# Patient Record
Sex: Female | Born: 1980 | Race: White | Hispanic: No | Marital: Single | Smoking: Current every day smoker
Health system: Southern US, Community
[De-identification: ages and names within clinical notes are randomized; demographics above are authoritative.]

## PROBLEM LIST (undated history)

## (undated) ENCOUNTER — Emergency Department: Discharge status: Absent without leave | Payer: Medicaid Other

## (undated) ENCOUNTER — Inpatient Hospital Stay (HOSPITAL_COMMUNITY): Payer: Self-pay

## (undated) DIAGNOSIS — B9689 Other specified bacterial agents as the cause of diseases classified elsewhere: Secondary | ICD-10-CM

## (undated) DIAGNOSIS — D649 Anemia, unspecified: Secondary | ICD-10-CM

## (undated) DIAGNOSIS — N760 Acute vaginitis: Secondary | ICD-10-CM

## (undated) DIAGNOSIS — F419 Anxiety disorder, unspecified: Secondary | ICD-10-CM

## (undated) DIAGNOSIS — F32A Depression, unspecified: Secondary | ICD-10-CM

## (undated) DIAGNOSIS — Z8742 Personal history of other diseases of the female genital tract: Secondary | ICD-10-CM

## (undated) DIAGNOSIS — F329 Major depressive disorder, single episode, unspecified: Secondary | ICD-10-CM

## (undated) DIAGNOSIS — R87629 Unspecified abnormal cytological findings in specimens from vagina: Secondary | ICD-10-CM

## (undated) HISTORY — DX: Major depressive disorder, single episode, unspecified: F32.9

## (undated) HISTORY — PX: COLPOSCOPY: SHX161

## (undated) HISTORY — DX: Personal history of other diseases of the female genital tract: Z87.42

## (undated) HISTORY — PX: INDUCED ABORTION: SHX677

## (undated) HISTORY — DX: Depression, unspecified: F32.A

## (undated) HISTORY — DX: Anemia, unspecified: D64.9

## (undated) HISTORY — PX: WISDOM TOOTH EXTRACTION: SHX21

## (undated) HISTORY — PX: TONSILLECTOMY: SUR1361

## (undated) HISTORY — DX: Anxiety disorder, unspecified: F41.9

---

## 2004-06-18 ENCOUNTER — Emergency Department: Payer: Self-pay | Admitting: Unknown Physician Specialty

## 2004-06-25 ENCOUNTER — Ambulatory Visit: Payer: Self-pay | Admitting: Obstetrics and Gynecology

## 2004-07-31 ENCOUNTER — Emergency Department: Payer: Self-pay | Admitting: Emergency Medicine

## 2004-11-12 ENCOUNTER — Ambulatory Visit (HOSPITAL_COMMUNITY): Admission: RE | Admit: 2004-11-12 | Discharge: 2004-11-12 | Payer: Self-pay | Admitting: Gynecology

## 2004-11-29 ENCOUNTER — Inpatient Hospital Stay (HOSPITAL_COMMUNITY): Admission: AD | Admit: 2004-11-29 | Discharge: 2004-11-29 | Payer: Self-pay | Admitting: Gynecology

## 2005-01-21 ENCOUNTER — Ambulatory Visit (HOSPITAL_COMMUNITY): Admission: RE | Admit: 2005-01-21 | Discharge: 2005-01-21 | Payer: Self-pay | Admitting: Gynecology

## 2005-03-05 ENCOUNTER — Inpatient Hospital Stay (HOSPITAL_COMMUNITY): Admission: AD | Admit: 2005-03-05 | Discharge: 2005-03-05 | Payer: Self-pay | Admitting: Gynecology

## 2005-03-11 ENCOUNTER — Emergency Department: Payer: Self-pay | Admitting: Emergency Medicine

## 2005-03-13 ENCOUNTER — Inpatient Hospital Stay (HOSPITAL_COMMUNITY): Admission: AD | Admit: 2005-03-13 | Discharge: 2005-03-15 | Payer: Self-pay | Admitting: Gynecology

## 2006-01-12 ENCOUNTER — Ambulatory Visit: Payer: Self-pay | Admitting: Obstetrics & Gynecology

## 2006-01-23 ENCOUNTER — Ambulatory Visit (HOSPITAL_COMMUNITY): Admission: RE | Admit: 2006-01-23 | Discharge: 2006-01-23 | Payer: Self-pay | Admitting: Gynecology

## 2006-02-05 ENCOUNTER — Ambulatory Visit: Payer: Self-pay | Admitting: Obstetrics & Gynecology

## 2006-02-05 ENCOUNTER — Ambulatory Visit: Payer: Self-pay | Admitting: Obstetrics and Gynecology

## 2006-02-05 ENCOUNTER — Encounter (INDEPENDENT_AMBULATORY_CARE_PROVIDER_SITE_OTHER): Payer: Self-pay | Admitting: *Deleted

## 2006-03-12 ENCOUNTER — Ambulatory Visit: Payer: Self-pay | Admitting: Obstetrics & Gynecology

## 2006-03-26 ENCOUNTER — Ambulatory Visit: Payer: Self-pay | Admitting: Obstetrics & Gynecology

## 2006-04-08 ENCOUNTER — Ambulatory Visit (HOSPITAL_COMMUNITY): Admission: RE | Admit: 2006-04-08 | Discharge: 2006-04-08 | Payer: Self-pay | Admitting: Gynecology

## 2006-04-09 ENCOUNTER — Ambulatory Visit: Payer: Self-pay | Admitting: Obstetrics & Gynecology

## 2006-05-06 ENCOUNTER — Ambulatory Visit: Payer: Self-pay | Admitting: Obstetrics & Gynecology

## 2006-05-27 ENCOUNTER — Ambulatory Visit: Payer: Self-pay | Admitting: Gynecology

## 2006-05-27 ENCOUNTER — Inpatient Hospital Stay (HOSPITAL_COMMUNITY): Admission: AD | Admit: 2006-05-27 | Discharge: 2006-05-27 | Payer: Self-pay | Admitting: Obstetrics & Gynecology

## 2006-06-03 ENCOUNTER — Ambulatory Visit: Payer: Self-pay | Admitting: Obstetrics & Gynecology

## 2006-06-23 ENCOUNTER — Ambulatory Visit: Payer: Self-pay | Admitting: Family Medicine

## 2006-07-08 ENCOUNTER — Ambulatory Visit: Payer: Self-pay | Admitting: Obstetrics & Gynecology

## 2006-07-13 ENCOUNTER — Ambulatory Visit (HOSPITAL_COMMUNITY): Admission: RE | Admit: 2006-07-13 | Discharge: 2006-07-13 | Payer: Self-pay | Admitting: Obstetrics & Gynecology

## 2006-07-21 ENCOUNTER — Ambulatory Visit: Payer: Self-pay | Admitting: Family Medicine

## 2006-08-05 ENCOUNTER — Ambulatory Visit: Payer: Self-pay | Admitting: Obstetrics & Gynecology

## 2006-08-12 ENCOUNTER — Ambulatory Visit: Payer: Self-pay | Admitting: Obstetrics & Gynecology

## 2006-08-12 ENCOUNTER — Ambulatory Visit: Payer: Self-pay | Admitting: Obstetrics and Gynecology

## 2006-08-12 ENCOUNTER — Encounter: Payer: Self-pay | Admitting: Obstetrics and Gynecology

## 2006-08-12 ENCOUNTER — Inpatient Hospital Stay (HOSPITAL_COMMUNITY): Admission: AD | Admit: 2006-08-12 | Discharge: 2006-08-15 | Payer: Self-pay | Admitting: Obstetrics and Gynecology

## 2006-09-23 ENCOUNTER — Ambulatory Visit: Payer: Self-pay | Admitting: Obstetrics & Gynecology

## 2006-09-23 ENCOUNTER — Encounter (INDEPENDENT_AMBULATORY_CARE_PROVIDER_SITE_OTHER): Payer: Self-pay | Admitting: Gynecology

## 2006-09-29 ENCOUNTER — Ambulatory Visit: Payer: Self-pay | Admitting: Family Medicine

## 2007-10-22 ENCOUNTER — Emergency Department: Payer: Self-pay | Admitting: Emergency Medicine

## 2008-09-21 ENCOUNTER — Emergency Department: Payer: Self-pay | Admitting: Emergency Medicine

## 2010-07-02 ENCOUNTER — Ambulatory Visit: Payer: Self-pay | Admitting: Obstetrics and Gynecology

## 2010-07-16 NOTE — Op Note (Signed)
[name], [name]          ACCOUNT NO.:  [id number]   MEDICAL RECORD NO.:  [id number]          PATIENT TYPE:  POB   LOCATION:  [facility]                          FACILITY:  [facility]   PHYSICIAN:  Phil D. Okey Dupre, M.D.     DATE OF BIRTH:  Aug 14, 1980   DATE OF PROCEDURE:  08/12/2006  DATE OF DISCHARGE:                               OPERATIVE REPORT   SURGEON:  Dr. Okey Dupre.   FIRST ASSISTANT:  Caren Griffins, certified midwife.   ANESTHESIA:  Spinal.   ESTIMATED BLOOD LOSS:  700 mL.   PROCEDURE:  Low transverse Cesarean section with small vertical  addition.   POSTOPERATIVE CONDITION:  Satisfactory.   The patient was a female weighing 2358 grams, i.e. 6 pounds 13 ounces,  Apgars 9 and 9, cord pH 7.34.   REASON FOR PROCEDURE:  The patient came in to the MAU in active labor  with no palpable presenting part in the pelvis. Ultrasound revealed a  transverse lie with the baby on its side and the head to the right.   The procedure went as follows:  Under satisfactory spinal anesthesia  with the patient in a dorsal supine position, the abdomen was prepped  and draped after a Foley catheter was placed in the urinary bladder, and  pelvic examination on the operating table confirmed the diagnosis of no  presenting part. The abdomen was then entered through a Pfannenstiel  transverse incision situated 3 cm above the symphysis pubis and running  for 18 cm length. The abdomen was entered by layers. On opening the  fascia, the rectus muscles were separated manually. The peritoneal  cavity was entered. The visceral peritoneum on the anterior surface of  the uterus was opened transversely just above the bladder and the  bladder flap formed and pushed down from the lower uterine segment which  was entered by sharp and blunt dissection. Because of the failure of the  lower uterine segment to be dilated, it was necessary to make a small  vertical additional of approximately 2 cm in the mid portion of  the  transverse uterine incision. The baby was then manipulated to bring the  head down into the operative field.  A vacuum assisted delivery was thus  easily accomplished. The cord was doubly clamped and divided. The baby's  mouth was suctioned of fluid, and the baby handed to the pediatrician.  Samples of blood were taken from the umbilical cord for analysis, and  the placenta was spontaneously removed, sent for pathological diagnosis.  The uterus explored and closed with a continuous running 0 Vicryl on an  atraumatic needle in a transverse method through the entire incision.  Once this was done, there was a small oozing in the center of the  incision which was controlled with a figure-of-eight of the same suture.  Area was once again observed for bleeding, and it was noted the pelvis  was irrigated with normal saline, and the fascia closed with a  continuous running 0 Vicryl on an atraumatic needle. Subcutaneous  bleeders were controlled  with hot cautery and skin edge approximation with skin staples. A dry  sterile dressing was applied. The patient was transferred to the  recovery room in satisfactory condition, having tolerated the procedure  well with a Foley catheter draining clear amber urine at the end of the  procedure.      Phil D. Okey Dupre, M.D.  Electronically Signed     PDR/MEDQ  D:  08/13/2006  T:  08/13/2006  Job:  [id number]

## 2010-07-16 NOTE — Discharge Summary (Signed)
Holly Gordon, Holly Gordon NO.:  [id number]   MEDICAL RECORD NO.:  [id number]          PATIENT TYPE:  INP   LOCATION:                                [facility]   PHYSICIAN:  Ginger Carne, MD  DATE OF BIRTH:  08-13-1980   DATE OF ADMISSION:  08/12/2006  DATE OF DISCHARGE:  08/15/2006                               DISCHARGE SUMMARY   DISCHARGE DIAGNOSES:  1. Delivery of a viable female infant via primary low transverse      cesarean section due to transverse lie and patient was in active      labor at the time of admission.  2. Postpartum fever.   DISCHARGE MEDICATIONS:  1. Motrin 600 mg one table q.6 hours p.r.n. pain.  2. Percocet 5/325 1-2 tablets q.4-6 hours p.r.n. pain.  3. Yasmin one tablet p.o. once daily.  4. Prenatal vitamin 1 tablet daily while breast feeding.   FOLLOW-UP INSTRUCTIONS:  The patient had her staples removed from her  incision prior to discharge.  She will followup at the [facility] in 6 weeks for her postpartum check.   PERTINENT LABS:  Blood type O positive with antibody negative. Rubella  immune.  Hepatitis B surface antigen is negative.  HIV is nonreactive.  Discharge hemoglobin is 12 on June 13.   HOSPITAL COURSE:  Holly Gordon is a 30 year old gravida 4, para 2, O, 2, 2,  who was admitted through the emergency room admissions in active labor  with cervical dilation to 5 cm.  Her baby was found to be in transverse  lie.  She was subsequently taken for primary low transverse cesarean  section due to this presentation.  Please see operative note for full  details of this procedure.  Holly Gordon had a postpartum fever which  spiked on June 12 at 3 p.m. to 100.7 degrees.  She then had two other  fevers throughout her course; her last fever was on June 13 at 7 a.m.  with a T-max to 99.6 degrees.  She was started on Ampicillin,  Gentamicin, and Clindamycin due to this postpartum fever.  She then  remained afebrile for a period  of 24 hours at which point her  antibiotics were stopped and she is now being discharged home.  She  reports that her pain is very well controlled with Motrin and Percocet.  She is bottle feeding and desires Yasmin contraception at the time of  discharge.  She was instructed to start her Yasmin oral contraceptive  pill 2 weeks after delivery of her baby due to an increased risk of  blood clots postpartum.  Otherwise Holly Gordon had a routine postpartum  course and is doing very well.    ______________________________  Sylvan Cheese, M.D.      Ginger Carne, MD  Electronically Signed   MJ/MEDQ  D:  08/15/2006  T:  08/15/2006  Job:  [id number]

## 2010-07-16 NOTE — Assessment & Plan Note (Signed)
NAME:  Holly Gordon, Holly Gordon NO.:  [id number]   MEDICAL RECORD NO.:  [id number]          PATIENT TYPE:  POB   LOCATION:  [facility]         FACILITY:  [facility]   PHYSICIAN:  Tinnie Gens, MD        DATE OF BIRTH:  Aug 16, 1980   DATE OF SERVICE:                                  CLINIC NOTE   CHIEF COMPLAINT:  Undesired fertility.   HISTORY OF PRESENT ILLNESS:  The patient is a 30 year old gravida 4,  para 2-0-2-2 who had a C-section for transverse lie on her last  delivery.  Appears to have had a vaginal delivery who desires for long  term sterilization.  She is here to have a postpartum check and desires  IUD insertion.   On exam, vital signs are in the chart.  She is well-developed, well-  nourished female in no acute distress.  ABDOMEN:  Soft, nontender, nondistended. She does have marked sunburn  across her shoulders.  GE:  Normal external female genitalia.  Vagina is pink and rugated.  Cervix is parous without lesions.   PROCEDURE:  The cervix is cleaned with Betadine x2.  Grasped the  anterior lip with a single tooth tenaculum and sounded to approximately  8 cm. IUD is inserted without difficulty.  Strings were trimmed to 1.5  cm.  The patient tolerated the procedure well.   IMPRESSION:  Undesired fertility. Status post IUD insertion.   PLAN:  Recheck strings in 2 weeks.           ______________________________  Tinnie Gens, MD     TP/MEDQ  D:  09/29/2006  T:  09/30/2006  Job:  [id number]

## 2010-07-19 NOTE — Op Note (Signed)
NAMEMARGEAUX, SWANTEK NO.:  [id number]   MEDICAL RECORD NO.:  [id number]          PATIENT TYPE:  INP   LOCATION:  9119                          [facility]ILITY[facility]   PHYSICIAN:  Charles A. Delcambre, MDDATE OF BIRTH:  03/18/1980   DATE OF PROCEDURE:  03/14/2005  DATE OF DISCHARGE:                                 OPERATIVE REPORT   DELIVERY NOTE:  This patient was admitted per Dr. Mia Creek and I took over  management, and she did go into labor with help of Pitocin augmentation at  low-dose protocol with cervix at 3 cm initially, becoming completely dilated  at 0216.  At approximately that  time she did develop some variables and was  checked and was noted to be complete.  She was somewhat difficult during  labor with cooperation per the nurse, and this is consistent with my history  and physical examination experience as well from 2115 on January 11.  When I  received a call to come for delivery, the nurse explained that the patient  was not cooperating for starting to push; however, when I arrived for  delivery shortly thereafter within 10 minutes, she was crowning and  delivered momentarily without difficulty.  She had spontaneous vaginal  delivery of a vigorous female, Apgars 8 and 9, intact perineum.  The placenta  was spontaneous, three-vessel and intact.  The patient cut the cord with my  assistance.  Cord arterial blood gas, cord could not be run, questionably  clotted per the lab.  Estimated blood loss was 300 mL or less.  Mother and  baby were recovering stably at this time.  The patient become complete at  0216 on January 12, delivered at 0258, and the placenta followed  spontaneously at 0314.      Charles A. Sydnee Cabal, MD  Electronically Signed     CAD/MEDQ  D:  03/14/2005  T:  03/14/2005  Job:  [id number]

## 2010-12-19 LAB — CBC
HCT: 33.7 — ABNORMAL LOW
HCT: 33.9 — ABNORMAL LOW
HCT: 35.1 — ABNORMAL LOW
HCT: 39.4
Hemoglobin: 11.3 — ABNORMAL LOW
Hemoglobin: 11.5 — ABNORMAL LOW
Hemoglobin: 12
Hemoglobin: 13.1
MCHC: 33.3
MCHC: 33.6
MCHC: 34.1
MCHC: 34.3
MCV: 92.1
MCV: 93
MCV: 93.2
MCV: 93.5
Platelets: 218
Platelets: 234
Platelets: 261
Platelets: 268
RBC: 3.6 — ABNORMAL LOW
RBC: 3.64 — ABNORMAL LOW
RBC: 3.81 — ABNORMAL LOW
RBC: 4.22
RDW: 12.8
RDW: 13
RDW: 13
RDW: 13.1
WBC: 18.4 — ABNORMAL HIGH
WBC: 21 — ABNORMAL HIGH
WBC: 21.3 — ABNORMAL HIGH
WBC: 26 — ABNORMAL HIGH

## 2010-12-19 LAB — URINALYSIS, ROUTINE W REFLEX MICROSCOPIC
Bilirubin Urine: NEGATIVE
Glucose, UA: NEGATIVE
Ketones, ur: NEGATIVE
Nitrite: NEGATIVE
Protein, ur: 100 — AB
Specific Gravity, Urine: <1.005 — ABNORMAL LOW
Urobilinogen, UA: 0.2
pH: 7

## 2010-12-19 LAB — URINE MICROSCOPIC-ADD ON

## 2010-12-19 LAB — CROSSMATCH
ABO/RH(D): O POS
Antibody Screen: NEGATIVE

## 2010-12-19 LAB — DIFFERENTIAL
Basophils Absolute: 0.1
Basophils Relative: 0
Eosinophils Absolute: 0.3
Eosinophils Relative: 1
Lymphocytes Relative: 13
Lymphs Abs: 2.7
Monocytes Absolute: 1.5 — ABNORMAL HIGH
Monocytes Relative: 7
Neutro Abs: 16.8 — ABNORMAL HIGH
Neutrophils Relative %: 79 — ABNORMAL HIGH

## 2010-12-19 LAB — RPR: RPR Ser Ql: NONREACTIVE

## 2010-12-19 LAB — ABO/RH: ABO/RH(D): O POS

## 2011-04-03 ENCOUNTER — Emergency Department: Payer: Self-pay | Admitting: *Deleted

## 2011-10-31 ENCOUNTER — Ambulatory Visit (INDEPENDENT_AMBULATORY_CARE_PROVIDER_SITE_OTHER): Payer: Self-pay | Admitting: Obstetrics & Gynecology

## 2011-10-31 ENCOUNTER — Encounter: Payer: Self-pay | Admitting: Obstetrics & Gynecology

## 2011-10-31 VITALS — BP 122/85 | HR 83 | Ht 63.0 in | Wt 164.0 lb

## 2011-10-31 DIAGNOSIS — Z716 Tobacco abuse counseling: Secondary | ICD-10-CM

## 2011-10-31 DIAGNOSIS — Z7189 Other specified counseling: Secondary | ICD-10-CM

## 2011-10-31 DIAGNOSIS — Z3009 Encounter for other general counseling and advice on contraception: Secondary | ICD-10-CM

## 2011-10-31 DIAGNOSIS — Z30432 Encounter for removal of intrauterine contraceptive device: Secondary | ICD-10-CM

## 2011-10-31 MED ORDER — NORETHIN-ETH ESTRAD-FE BIPHAS 1 MG-10 MCG / 10 MCG PO TABS: 1.0000 | ORAL_TABLET | Freq: Every day | ORAL | Status: DC

## 2011-10-31 NOTE — Progress Notes (Signed)
IUD Removal Note  Patient desires removal of Mirena IUD after it being in place for five years, wants OCPs for [facility].  Smokes 1.5 packs of cigarettes daily.  Has tolerated OCPs in the past, no personal history of thrombosis.   Patient was in the dorsal lithotomy position, normal external genitalia was noted. A speculum was placed in the patient's vagina, normal discharge was noted, no lesions. The multiparous cervix was visualized, no lesions, no abnormal discharge; and the cervix was swabbed with Betadine using scopettes. The strings of the IUD were grasped and pulled using ring forceps. The IUD was successfully removed in its entirety. Patient tolerated the procedure well.   The use of the oral contraceptive has been fully discussed with the patient. This includes the proper method to initiate (i.e. Sunday start after next normal menstrual onset) and continue the pills, the need for regular compliance to ensure adequate contraceptive effect, the physiology which make the pill effective, the instructions for what to do in event of a missed pill, and warnings about anticipated minor side effects such as breakthrough spotting, nausea, breast tenderness, weight changes, acne, headaches, etc. She has been told of the more serious potential side effects such as MI, stroke, and deep vein thrombosis, all of which she is at increased risk for secondary to smoking which is why the lowest estrogen pill is recommended. She has been asked to report any signs of such serious problems immediately. She should back up the pill with a condom during any cycle in which antibiotics are prescribed, and during the first cycle as well. The need for additional protection, such as a condom, to prevent exposure to sexually transmitted diseases has also been discussed- the patient has been clearly reminded that OCP's cannot protect her against diseases such as HIV and others. She understands and wishes to take the medication as  prescribed.   LoLoestrinFe, with 10 mcg of estrogen, two samples were given to the patient. She will return in 2 months for BP and OCP check. Patient will also apply for Mirena, she is also applying for [facility]. Once these are approved, she will return for annual exam, pap smear (last one was in 2008 and was LGSIL) and Mirena IUD insertion.  Routine preventative health maintenance measures also emphasized, also counseled about smoking reduction and cessation.

## 2011-10-31 NOTE — Patient Instructions (Signed)

## 2012-06-12 ENCOUNTER — Emergency Department: Payer: Self-pay | Admitting: Emergency Medicine

## 2013-01-31 ENCOUNTER — Emergency Department: Payer: Self-pay | Admitting: Emergency Medicine

## 2013-01-31 LAB — COMPREHENSIVE METABOLIC PANEL
Albumin: 3.8 g/dL (ref 3.4–5.0)
Alkaline Phosphatase: 101 U/L
Anion Gap: 2 — ABNORMAL LOW (ref 7–16)
BUN: 5 mg/dL — ABNORMAL LOW (ref 7–18)
Bilirubin,Total: 0.6 mg/dL (ref 0.2–1.0)
Calcium, Total: 9.2 mg/dL (ref 8.5–10.1)
Chloride: 107 mmol/L (ref 98–107)
Co2: 29 mmol/L (ref 21–32)
Creatinine: 0.82 mg/dL (ref 0.60–1.30)
EGFR (African American): >60
EGFR (Non-African Amer.): >60
Glucose: 98 mg/dL (ref 65–99)
Osmolality: 273 (ref 275–301)
Potassium: 4 mmol/L (ref 3.5–5.1)
SGOT(AST): 22 U/L (ref 15–37)
SGPT (ALT): 22 U/L (ref 12–78)
Sodium: 138 mmol/L (ref 136–145)
Total Protein: 7.7 g/dL (ref 6.4–8.2)

## 2013-01-31 LAB — CBC WITH DIFFERENTIAL/PLATELET
Basophil #: 0.2 10*3/uL — ABNORMAL HIGH (ref 0.0–0.1)
Basophil %: 1.2 %
Eosinophil #: 0.2 10*3/uL (ref 0.0–0.7)
Eosinophil %: 1.3 %
HCT: 44.3 % (ref 35.0–47.0)
HGB: 15.1 g/dL (ref 12.0–16.0)
Lymphocyte #: 2.4 10*3/uL (ref 1.0–3.6)
Lymphocyte %: 15.4 %
MCH: 33.2 pg (ref 26.0–34.0)
MCHC: 34 g/dL (ref 32.0–36.0)
MCV: 97 fL (ref 80–100)
Monocyte #: 0.9 x10 3/mm (ref 0.2–0.9)
Monocyte %: 6.1 %
Neutrophil #: 11.7 10*3/uL — ABNORMAL HIGH (ref 1.4–6.5)
Neutrophil %: 76 %
Platelet: 237 10*3/uL (ref 150–440)
RBC: 4.55 10*6/uL (ref 3.80–5.20)
RDW: 13.2 % (ref 11.5–14.5)
WBC: 15.4 10*3/uL — ABNORMAL HIGH (ref 3.6–11.0)

## 2013-01-31 LAB — URINALYSIS, COMPLETE
Bacteria: NONE SEEN
Bilirubin,UR: NEGATIVE
Blood: NEGATIVE
Glucose,UR: NEGATIVE mg/dL (ref 0–75)
Ketone: NEGATIVE
Leukocyte Esterase: NEGATIVE
Nitrite: NEGATIVE
Ph: 8 (ref 4.5–8.0)
Protein: NEGATIVE
RBC,UR: 1 /HPF (ref 0–5)
Specific Gravity: 1.012 (ref 1.003–1.030)
Squamous Epithelial: 1
WBC UR: 1 /HPF (ref 0–5)

## 2013-01-31 LAB — LIPASE, BLOOD: Lipase: 117 U/L (ref 73–393)

## 2013-08-27 ENCOUNTER — Emergency Department: Payer: Self-pay | Admitting: Internal Medicine

## 2013-10-11 ENCOUNTER — Emergency Department: Payer: Self-pay | Admitting: Emergency Medicine

## 2013-10-11 LAB — COMPREHENSIVE METABOLIC PANEL
Albumin: 3.9 g/dL (ref 3.4–5.0)
Alkaline Phosphatase: 74 U/L
Anion Gap: 9 (ref 7–16)
BUN: 6 mg/dL — ABNORMAL LOW (ref 7–18)
Bilirubin,Total: 0.9 mg/dL (ref 0.2–1.0)
Calcium, Total: 8.7 mg/dL (ref 8.5–10.1)
Chloride: 107 mmol/L (ref 98–107)
Co2: 23 mmol/L (ref 21–32)
Creatinine: 0.86 mg/dL (ref 0.60–1.30)
EGFR (African American): >60
EGFR (Non-African Amer.): >60
Glucose: 85 mg/dL (ref 65–99)
Osmolality: 274 (ref 275–301)
Potassium: 3.7 mmol/L (ref 3.5–5.1)
SGOT(AST): 23 U/L (ref 15–37)
SGPT (ALT): 21 U/L
Sodium: 139 mmol/L (ref 136–145)
Total Protein: 7.6 g/dL (ref 6.4–8.2)

## 2013-10-11 LAB — URINALYSIS, COMPLETE
Bilirubin,UR: NEGATIVE
Blood: NEGATIVE
Glucose,UR: NEGATIVE mg/dL (ref 0–75)
Leukocyte Esterase: NEGATIVE
Nitrite: NEGATIVE
Ph: 6 (ref 4.5–8.0)
Protein: NEGATIVE
RBC,UR: <1 /HPF (ref 0–5)
Specific Gravity: 1.016 (ref 1.003–1.030)
Squamous Epithelial: 2
WBC UR: 1 /HPF (ref 0–5)

## 2013-10-11 LAB — CBC WITH DIFFERENTIAL/PLATELET
Basophil #: 0.1 10*3/uL (ref 0.0–0.1)
Basophil %: 1.1 %
Eosinophil #: 0.1 10*3/uL (ref 0.0–0.7)
Eosinophil %: 1 %
HCT: 44.6 % (ref 35.0–47.0)
HGB: 14.9 g/dL (ref 12.0–16.0)
Lymphocyte #: 2.2 10*3/uL (ref 1.0–3.6)
Lymphocyte %: 29.9 %
MCH: 32.3 pg (ref 26.0–34.0)
MCHC: 33.4 g/dL (ref 32.0–36.0)
MCV: 97 fL (ref 80–100)
Monocyte #: 0.6 x10 3/mm (ref 0.2–0.9)
Monocyte %: 8.1 %
Neutrophil #: 4.4 10*3/uL (ref 1.4–6.5)
Neutrophil %: 59.9 %
Platelet: 182 10*3/uL (ref 150–440)
RBC: 4.61 10*6/uL (ref 3.80–5.20)
RDW: 14 % (ref 11.5–14.5)
WBC: 7.3 10*3/uL (ref 3.6–11.0)

## 2013-10-11 LAB — LIPASE, BLOOD: Lipase: 83 U/L (ref 73–393)

## 2013-10-29 ENCOUNTER — Emergency Department: Payer: Self-pay | Admitting: Emergency Medicine

## 2013-12-03 ENCOUNTER — Emergency Department: Payer: Self-pay | Admitting: Emergency Medicine

## 2013-12-03 LAB — URINALYSIS, COMPLETE
Bacteria: NONE SEEN
Bilirubin,UR: NEGATIVE
Blood: NEGATIVE
Glucose,UR: NEGATIVE mg/dL (ref 0–75)
Leukocyte Esterase: NEGATIVE
Nitrite: NEGATIVE
Ph: 9 (ref 4.5–8.0)
Protein: 100
RBC,UR: 1 /HPF (ref 0–5)
Specific Gravity: 1.023 (ref 1.003–1.030)
Squamous Epithelial: NONE SEEN
WBC UR: <1 /HPF (ref 0–5)

## 2013-12-03 LAB — COMPREHENSIVE METABOLIC PANEL
Albumin: 4.3 g/dL (ref 3.4–5.0)
Alkaline Phosphatase: 85 U/L
Anion Gap: 8 (ref 7–16)
BUN: 9 mg/dL (ref 7–18)
Bilirubin,Total: 0.7 mg/dL (ref 0.2–1.0)
Calcium, Total: 9 mg/dL (ref 8.5–10.1)
Chloride: 104 mmol/L (ref 98–107)
Co2: 26 mmol/L (ref 21–32)
Creatinine: 0.89 mg/dL (ref 0.60–1.30)
Glucose: 137 mg/dL — ABNORMAL HIGH (ref 65–99)
Osmolality: 277 (ref 275–301)
Potassium: 3.6 mmol/L (ref 3.5–5.1)
SGOT(AST): 15 U/L (ref 15–37)
SGPT (ALT): 25 U/L
Sodium: 138 mmol/L (ref 136–145)
Total Protein: 7.6 g/dL (ref 6.4–8.2)

## 2013-12-03 LAB — CBC WITH DIFFERENTIAL/PLATELET
Basophil #: 0.1 10*3/uL (ref 0.0–0.1)
Basophil %: 0.6 %
Eosinophil #: 0 10*3/uL (ref 0.0–0.7)
Eosinophil %: 0 %
HCT: 48.4 % — ABNORMAL HIGH (ref 35.0–47.0)
HGB: 16 g/dL (ref 12.0–16.0)
Lymphocyte #: 0.8 10*3/uL — ABNORMAL LOW (ref 1.0–3.6)
Lymphocyte %: 9.7 %
MCH: 31.8 pg (ref 26.0–34.0)
MCHC: 33 g/dL (ref 32.0–36.0)
MCV: 96 fL (ref 80–100)
Monocyte #: 0.3 x10 3/mm (ref 0.2–0.9)
Monocyte %: 3.6 %
Neutrophil #: 7.1 10*3/uL — ABNORMAL HIGH (ref 1.4–6.5)
Neutrophil %: 86.1 %
Platelet: 212 10*3/uL (ref 150–440)
RBC: 5.03 10*6/uL (ref 3.80–5.20)
RDW: 14.3 % (ref 11.5–14.5)
WBC: 8.2 10*3/uL (ref 3.6–11.0)

## 2013-12-03 LAB — LIPASE, BLOOD: Lipase: 90 U/L (ref 73–393)

## 2013-12-03 LAB — TROPONIN I: Troponin-I: <0.02 ng/mL

## 2013-12-03 LAB — MAGNESIUM: Magnesium: 1.5 mg/dL — ABNORMAL LOW

## 2013-12-05 ENCOUNTER — Emergency Department: Payer: Self-pay | Admitting: Emergency Medicine

## 2014-01-02 ENCOUNTER — Encounter: Payer: Self-pay | Admitting: Obstetrics & Gynecology

## 2014-02-19 ENCOUNTER — Emergency Department: Payer: Self-pay | Admitting: Emergency Medicine

## 2014-02-19 LAB — URINALYSIS, COMPLETE
Bacteria: NONE SEEN
Bilirubin,UR: NEGATIVE
Blood: NEGATIVE
Glucose,UR: NEGATIVE mg/dL (ref 0–75)
Ketone: NEGATIVE
Leukocyte Esterase: NEGATIVE
Nitrite: NEGATIVE
Ph: 5 (ref 4.5–8.0)
Protein: NEGATIVE
RBC,UR: NONE SEEN /HPF (ref 0–5)
Specific Gravity: 1.005 (ref 1.003–1.030)
Squamous Epithelial: NONE SEEN
WBC UR: 1 /HPF (ref 0–5)

## 2015-05-02 ENCOUNTER — Ambulatory Visit: Payer: Self-pay

## 2015-08-01 ENCOUNTER — Encounter: Payer: Self-pay | Admitting: Emergency Medicine

## 2015-08-01 ENCOUNTER — Emergency Department
Admission: EM | Admit: 2015-08-01 | Discharge: 2015-08-01 | Disposition: A | Discharge status: Home / Self Care | Payer: Self-pay | Attending: Emergency Medicine | Admitting: Emergency Medicine

## 2015-08-01 DIAGNOSIS — F17210 Nicotine dependence, cigarettes, uncomplicated: Secondary | ICD-10-CM | POA: Insufficient documentation

## 2015-08-01 DIAGNOSIS — H6993 Unspecified Eustachian tube disorder, bilateral: Secondary | ICD-10-CM | POA: Insufficient documentation

## 2015-08-01 DIAGNOSIS — H6983 Other specified disorders of Eustachian tube, bilateral: Secondary | ICD-10-CM

## 2015-08-01 DIAGNOSIS — J302 Other seasonal allergic rhinitis: Secondary | ICD-10-CM | POA: Insufficient documentation

## 2015-08-01 DIAGNOSIS — F329 Major depressive disorder, single episode, unspecified: Secondary | ICD-10-CM | POA: Insufficient documentation

## 2015-08-01 LAB — POCT RAPID STREP A: Streptococcus, Group A Screen (Direct): NEGATIVE

## 2015-08-01 MED ORDER — BENZONATATE 100 MG PO CAPS: 200.0000 mg | ORAL_CAPSULE | Freq: Three times a day (TID) | ORAL | Status: AC | PRN

## 2015-08-01 MED ORDER — ONDANSETRON 4 MG PO TBDP: 4.0000 mg | ORAL_TABLET | Freq: Three times a day (TID) | ORAL | Status: DC | PRN

## 2015-08-01 MED ORDER — PREDNISONE 10 MG PO TABS: ORAL_TABLET | ORAL | Status: DC

## 2015-08-01 NOTE — Discharge Instructions (Signed)
Taken over-the-counter Zyrtec or Claritin once a day for allergies. Begin prednisone 6 day taper starting today. Take Tylenol if needed for throat or ear pain. Drink lots of water. Follow-up with [facility] if any continued problems with your ears or throat.

## 2015-08-01 NOTE — ED Provider Notes (Signed)
[facility] Emergency Department Provider Note   ____________________________________________  Time seen: Approximately 4:59 PM  I have reviewed the triage vital signs and the nursing notes.   HISTORY  Chief Complaint Otalgia and Sore Throat    HPI Holly Gordon is a 35 y.o. female  is here complaining of left ear pain that radiates down into her neck for the last 3 weeks. She also complains of sore throat and nonproductive cough. She states she has been coughing so hard she "vomits". She denies any fever but has had "chills" she states she's been taking Benadryl 2 capsules a.m. and 2 capsules p.m. to control her allergies. She states that her ears will pop occasionally but will not stay open and that her hearing is decreased.Currently she does not have a medical doctor but does have a doctor that prescribes her depression and anxiety medication. She denies any diarrhea. She does have some sneezing and allergy symptoms. She is unaware of any exposure to strep throat. Currently she rates her pain as 6 out of 10.   Past Medical History  Diagnosis Date  . Anemia   . Anxiety   . Depression   . History of abnormal cervical Pap smear     There are no active problems to display for this patient.   Past Surgical History  Procedure Laterality Date  . Colposcopy      abnormal pap  . Tonsillectomy    . Induced abortion    . Cesarean section  2008    x1  . Wisdom tooth extraction      x4    Current Outpatient Rx  Name  Route  Sig  Dispense  Refill  . benzonatate (TESSALON PERLES) 100 MG capsule   Oral   Take 2 capsules (200 mg total) by mouth 3 (three) times daily as needed for cough.   30 capsule   0   . EXPIRED: Norethindrone-Ethinyl Estradiol-Fe Biphas (LO LOESTRIN FE) 1 MG-10 MCG / 10 MCG tablet   Oral   Take 1 tablet by mouth daily.   2 Package   0   . ondansetron (ZOFRAN ODT) 4 MG disintegrating tablet   Oral   Take 1 tablet (4  mg total) by mouth every 8 (eight) hours as needed for nausea or vomiting.   20 tablet   0   . predniSONE (DELTASONE) 10 MG tablet      Take 6 tablets  today, on day 2 take 5 tablets, day 3 take 4 tablets, day 4 take 3 tablets, day 5 take  2 tablets and 1 tablet the last day   21 tablet   0     Allergies Morphine and related  Family History  Problem Relation Age of Onset  . Hypertension Father   . Kidney disease Maternal Grandmother   . Heart disease Maternal Grandmother   . Alzheimer's disease Maternal Grandfather     Social History Social History  Substance Use Topics  . Smoking status: Current Every Day Smoker -- 1.50 packs/day    Types: Cigarettes  . Smokeless tobacco: None  . Alcohol Use: No    Review of Systems Constitutional: Possible fever/chills but temperature not taken. Eyes: No visual changes. ENT: Positive sore throat. Positive left ear pain. Cardiovascular: Denies chest pain. Respiratory: Denies shortness of breath. Positive cough. Gastrointestinal:   No nausea. Positive vomiting with coughing. Musculoskeletal: Negative for back pain. Skin: Negative for rash. Neurological: Negative for headaches, focal weakness or numbness.  10-point ROS otherwise negative.  ____________________________________________   PHYSICAL EXAM:  VITAL SIGNS: ED Triage Vitals  Enc Vitals Group     BP 08/01/15 1643 152/86 mmHg     Pulse Rate 08/01/15 1643 99     Resp 08/01/15 1643 18     Temp 08/01/15 1643 99.2 F (37.3 C)     Temp Source 08/01/15 1643 Oral     SpO2 08/01/15 1643 98 %     Weight 08/01/15 1643 200 lb (90.719 kg)     Height 08/01/15 1643 5\' 4"  (1.626 m)     Head Cir --      Peak Flow --      Pain Score 08/01/15 1644 6     Pain Loc --      Pain Edu? --      Excl. in GC? --     Constitutional: Alert and oriented. Well appearing and in no acute distress. Eyes: Conjunctivae are normal. PERRL. EOMI. Head: Atraumatic. Nose: Positive congestion/no  rhinnorhea.   EACs are clear bilaterally. TMs are dull without fluid, erythema or injection. Mouth/Throat: Mucous membranes are moist.  Oropharynx non-erythematous. Moderate posterior drainage is seen. Neck: No stridor.   Hematological/Lymphatic/Immunilogical: No cervical lymphadenopathy. Cardiovascular: Normal rate, regular rhythm. Grossly normal heart sounds.  Good peripheral circulation. Respiratory: Normal respiratory effort.  No retractions. Lungs CTAB. Gastrointestinal: Soft and nontender. No distention.  Musculoskeletal: No lower extremity tenderness nor edema.  No joint effusions. Neurologic:  Normal speech and language. No gross focal neurologic deficits are appreciated. Normal gait was noted. Skin:  Skin is warm, dry and intact. No rash noted. Psychiatric: Mood and affect are normal. Speech and behavior are normal.  ____________________________________________   LABS (all labs ordered are listed, but only abnormal results are displayed)  Labs Reviewed  POCT RAPID STREP A      PROCEDURES  Procedure(s) performed: None  Critical Care performed: No  ____________________________________________   INITIAL IMPRESSION / ASSESSMENT AND PLAN / ED COURSE  Pertinent labs & imaging results that were available during my care of the patient were reviewed by me and considered in my medical decision making (see chart for details).  Strep test was negative and patient was informed. We discussed possibly that most likely most of her symptoms are coming from her allergies. She will discontinue taking the Benadryl and begin taking Claritin or Zyrtec one daily. She is also given a prescription for prednisone 60 mg 6 day taper. Because she is coughing until she throws up she was given a prescription also for Tessalon Perles and Zofran if needed for nausea. Patient is increase fluids and she was encouraged to go to [facility] if any continued upper respiratory  problems. ____________________________________________   FINAL CLINICAL IMPRESSION(S) / ED DIAGNOSES  Final diagnoses:  Eustachian tube dysfunction, bilateral  Seasonal allergies      NEW MEDICATIONS STARTED DURING THIS VISIT:  Discharge Medication List as of 08/01/2015  5:19 PM    START taking these medications   Details  predniSONE (DELTASONE) 10 MG tablet Take 6 tablets  today, on day 2 take 5 tablets, day 3 take 4 tablets, day 4 take 3 tablets, day 5 take  2 tablets and 1 tablet the last day, Print         Note:  This document was prepared using Dragon voice recognition software and may include unintentional dictation errors.    Tommi RumpsRhonda L Summers, PA-C 08/01/15 2143  Jennye MoccasinBrian S Quigley, MD 08/01/15 2221

## 2015-08-01 NOTE — ED Notes (Signed)
Pt states she has had pain to left ear that radiates into neck for 3 weeks. Pt is now complaining of sore throat, and cough. Pt states she has been coughing so hard she is vomiting.

## 2015-08-01 NOTE — ED Notes (Signed)
Pt refused to sign d/c papers or verbalize understanding of d/c and f/u. Pt curing in room. Pt left without signing.

## 2015-08-30 ENCOUNTER — Emergency Department
Admission: EM | Admit: 2015-08-30 | Discharge: 2015-08-30 | Disposition: A | Discharge status: Home / Self Care | Payer: Self-pay | Attending: Emergency Medicine | Admitting: Emergency Medicine

## 2015-08-30 ENCOUNTER — Emergency Department: Payer: Self-pay

## 2015-08-30 DIAGNOSIS — S93402A Sprain of unspecified ligament of left ankle, initial encounter: Secondary | ICD-10-CM | POA: Insufficient documentation

## 2015-08-30 DIAGNOSIS — F17210 Nicotine dependence, cigarettes, uncomplicated: Secondary | ICD-10-CM | POA: Insufficient documentation

## 2015-08-30 DIAGNOSIS — Y929 Unspecified place or not applicable: Secondary | ICD-10-CM | POA: Insufficient documentation

## 2015-08-30 DIAGNOSIS — F329 Major depressive disorder, single episode, unspecified: Secondary | ICD-10-CM | POA: Insufficient documentation

## 2015-08-30 DIAGNOSIS — W1839XA Other fall on same level, initial encounter: Secondary | ICD-10-CM | POA: Insufficient documentation

## 2015-08-30 DIAGNOSIS — Y939 Activity, unspecified: Secondary | ICD-10-CM | POA: Insufficient documentation

## 2015-08-30 DIAGNOSIS — Y999 Unspecified external cause status: Secondary | ICD-10-CM | POA: Insufficient documentation

## 2015-08-30 DIAGNOSIS — Z79899 Other long term (current) drug therapy: Secondary | ICD-10-CM | POA: Insufficient documentation

## 2015-08-30 MED ORDER — TRAMADOL HCL 50 MG PO TABS: 50.0000 mg | ORAL_TABLET | Freq: Four times a day (QID) | ORAL | Status: DC | PRN

## 2015-08-30 MED ORDER — NAPROXEN 500 MG PO TABS: 500.0000 mg | ORAL_TABLET | Freq: Two times a day (BID) | ORAL | Status: DC

## 2015-08-30 NOTE — ED Provider Notes (Signed)
[facility] Emergency Department Provider Note ____________________________________________  Time seen: Approximately 11:34 AM  I have reviewed the triage vital signs and the nursing notes.   HISTORY  Chief Complaint Ankle Injury    HPI Holly Gordon is a 35 y.o. female who presents to the emergency department for evaluation of left ankle pain. She got wrapped up on a dog leash yesterday and pulled off of a porch and landed on her left ankle. She states that she has been able to bear weight, but it has been painful and she noticed that the ankle was more swollen this morning than before she went to bed last night, so she decided to come in for evaluation. She denies history of ankle injury. She has taken ibuprofen with minimal relief.  Past Medical History  Diagnosis Date  . Anemia   . Anxiety   . Depression   . History of abnormal cervical Pap smear     There are no active problems to display for this patient.   Past Surgical History  Procedure Laterality Date  . Colposcopy      abnormal pap  . Tonsillectomy    . Induced abortion    . Cesarean section  2008    x1  . Wisdom tooth extraction      x4    Current Outpatient Rx  Name  Route  Sig  Dispense  Refill  . benzonatate (TESSALON PERLES) 100 MG capsule   Oral   Take 2 capsules (200 mg total) by mouth 3 (three) times daily as needed for cough.   30 capsule   0   . naproxen (NAPROSYN) 500 MG tablet   Oral   Take 1 tablet (500 mg total) by mouth 2 (two) times daily with a meal.   60 tablet   0   . EXPIRED: Norethindrone-Ethinyl Estradiol-Fe Biphas (LO LOESTRIN FE) 1 MG-10 MCG / 10 MCG tablet   Oral   Take 1 tablet by mouth daily.   2 Package   0   . ondansetron (ZOFRAN ODT) 4 MG disintegrating tablet   Oral   Take 1 tablet (4 mg total) by mouth every 8 (eight) hours as needed for nausea or vomiting.   20 tablet   0   . predniSONE (DELTASONE) 10 MG tablet      Take 6  tablets  today, on day 2 take 5 tablets, day 3 take 4 tablets, day 4 take 3 tablets, day 5 take  2 tablets and 1 tablet the last day   21 tablet   0   . traMADol (ULTRAM) 50 MG tablet   Oral   Take 1 tablet (50 mg total) by mouth every 6 (six) hours as needed.   12 tablet   0     Allergies Morphine and related  Family History  Problem Relation Age of Onset  . Hypertension Father   . Kidney disease Maternal Grandmother   . Heart disease Maternal Grandmother   . Alzheimer's disease Maternal Grandfather     Social History Social History  Substance Use Topics  . Smoking status: Current Every Day Smoker -- 1.50 packs/day    Types: Cigarettes  . Smokeless tobacco: None  . Alcohol Use: No    Review of Systems Constitutional: No recent illness. Cardiovascular: Denies chest pain or palpitations. Respiratory: Denies shortness of breath. Musculoskeletal: Pain in Left ankle. Skin: Negative for rash, wound, lesion. Neurological: Negative for focal weakness or numbness.  ____________________________________________  PHYSICAL EXAM:  VITAL SIGNS: ED Triage Vitals  Enc Vitals Group     BP 08/30/15 1019 125/83 mmHg     Pulse Rate 08/30/15 1019 84     Resp 08/30/15 1019 20     Temp 08/30/15 1019 98.2 F (36.8 C)     Temp Source 08/30/15 1019 Oral     SpO2 08/30/15 1019 100 %     Weight 08/30/15 1019 203 lb (92.08 kg)     Height 08/30/15 1019 5\' 5"  (1.651 m)     Head Cir --      Peak Flow --      Pain Score 08/30/15 1013 10     Pain Loc --      Pain Edu? --      Excl. in GC? --     Constitutional: Alert and oriented. Well appearing and in no acute distress. Eyes: Conjunctivae are normal. EOMI. Head: Atraumatic. Neck: No stridor.  Respiratory: Normal respiratory effort.   Musculoskeletal: Mild edema noted in the ATFL pattern of the left ankle without erythema. Limited range of motion secondary to pain. Neurologic:  Normal speech and language. No gross focal  neurologic deficits are appreciated. Speech is normal. No gait instability. Pulse, motor and sensory is intact. Skin:  Skin is warm, dry and intact. Atraumatic. Psychiatric: Mood and affect are normal. Speech and behavior are normal.  ____________________________________________   LABS (all labs ordered are listed, but only abnormal results are displayed)  Labs Reviewed - No data to display ____________________________________________  RADIOLOGY  Left ankle negative for acute bony abnormality per radiology. ____________________________________________   PROCEDURES  Procedure(s) performed:  Ankle stirrup splint applied by ER tech. Patient neurovascularly intact post-application.   ____________________________________________   INITIAL IMPRESSION / ASSESSMENT AND PLAN / ED COURSE  Pertinent labs & imaging results that were available during my care of the patient were reviewed by me and considered in my medical decision making (see chart for details).  Patient was advised to take her tramadol and Naprosyn if needed as prescribed. She was advised to follow-up with orthopedics for symptoms that are not improving over the next week. She was advised to return to the emergency department for symptoms that change or worsen if unable to schedule an appointment. ____________________________________________   FINAL CLINICAL IMPRESSION(S) / ED DIAGNOSES  Final diagnoses:  Ankle sprain, left, initial encounter       Chinita PesterCari B Danial Sisley, FNP 08/30/15 1510  Sharman CheekPhillip Stafford, MD 09/02/15 1435

## 2015-08-30 NOTE — Discharge Instructions (Signed)
Ankle Sprain  An ankle sprain is an injury to the strong, fibrous tissues (ligaments) that hold the bones of your ankle joint together.   CAUSES  An ankle sprain is usually caused by a fall or by twisting your ankle. Ankle sprains most commonly occur when you step on the outer edge of your foot, and your ankle turns inward. People who participate in sports are more prone to these types of injuries.   SYMPTOMS    Pain in your ankle. The pain may be present at rest or only when you are trying to stand or walk.   Swelling.   Bruising. Bruising may develop immediately or within 1 to 2 days after your injury.   Difficulty standing or walking, particularly when turning corners or changing directions.  DIAGNOSIS   Your caregiver will ask you details about your injury and perform a physical exam of your ankle to determine if you have an ankle sprain. During the physical exam, your caregiver will press on and apply pressure to specific areas of your foot and ankle. Your caregiver will try to move your ankle in certain ways. An X-ray exam may be done to be sure a bone was not broken or a ligament did not separate from one of the bones in your ankle (avulsion fracture).   TREATMENT   Certain types of braces can help stabilize your ankle. Your caregiver can make a recommendation for this. Your caregiver may recommend the use of medicine for pain. If your sprain is severe, your caregiver may refer you to a surgeon who helps to restore function to parts of your skeletal system (orthopedist) or a physical therapist.  HOME CARE INSTRUCTIONS    Apply ice to your injury for 1-2 days or as directed by your caregiver. Applying ice helps to reduce inflammation and pain.    Put ice in a plastic bag.    Place a towel between your skin and the bag.    Leave the ice on for 15-20 minutes at a time, every 2 hours while you are awake.   Only take over-the-counter or prescription medicines for pain, discomfort, or fever as directed by  your caregiver.   Elevate your injured ankle above the level of your heart as much as possible for 2-3 days.   If your caregiver recommends crutches, use them as instructed. Gradually put weight on the affected ankle. Continue to use crutches or a cane until you can walk without feeling pain in your ankle.   If you have a plaster splint, wear the splint as directed by your caregiver. Do not rest it on anything harder than a pillow for the first 24 hours. Do not put weight on it. Do not get it wet. You may take it off to take a shower or bath.   You may have been given an elastic bandage to wear around your ankle to provide support. If the elastic bandage is too tight (you have numbness or tingling in your foot or your foot becomes cold and blue), adjust the bandage to make it comfortable.   If you have an air splint, you may blow more air into it or let air out to make it more comfortable. You may take your splint off at night and before taking a shower or bath. Wiggle your toes in the splint several times per day to decrease swelling.  SEEK MEDICAL CARE IF:    You have rapidly increasing bruising or swelling.   Your toes feel   extremely cold or you lose feeling in your foot.   Your pain is not relieved with medicine.  SEEK IMMEDIATE MEDICAL CARE IF:   Your toes are numb or blue.   You have severe pain that is increasing.  MAKE SURE YOU:    Understand these instructions.   Will watch your condition.   Will get help right away if you are not doing well or get worse.     This information is not intended to replace advice given to you by your health care provider. Make sure you discuss any questions you have with your health care provider.     Document Released: 02/17/2005 Document Revised: 03/10/2014 Document Reviewed: 03/01/2011  Elsevier Interactive Patient Education 2016 [employer]

## 2015-08-30 NOTE — ED Notes (Addendum)
Pt got wrapped up on dog leash yesterday and got pulled off of porch and landed on left ankle. Left ankle pain since. Pt alert and oriented X4, active, cooperative, pt in NAD. RR even and unlabored, color WNL.  Ambulatory.   Denies any other injuries.

## 2015-08-30 NOTE — ED Notes (Signed)
Pt given warm blanket by Felicia, EDT. 

## 2016-07-28 ENCOUNTER — Emergency Department
Admission: EM | Admit: 2016-07-28 | Discharge: 2016-07-28 | Disposition: A | Discharge status: Home / Self Care | Payer: Self-pay | Attending: Emergency Medicine | Admitting: Emergency Medicine

## 2016-07-28 DIAGNOSIS — M5431 Sciatica, right side: Secondary | ICD-10-CM

## 2016-07-28 DIAGNOSIS — F17210 Nicotine dependence, cigarettes, uncomplicated: Secondary | ICD-10-CM | POA: Insufficient documentation

## 2016-07-28 MED ORDER — PREDNISONE 10 MG (21) PO TBPK: ORAL_TABLET | ORAL | 0 refills | Status: DC

## 2016-07-28 MED ORDER — IBUPROFEN 800 MG PO TABS: 800.0000 mg | ORAL_TABLET | Freq: Three times a day (TID) | ORAL | 0 refills | Status: DC | PRN

## 2016-07-28 MED ORDER — METHYLPREDNISOLONE SODIUM SUCC 125 MG IJ SOLR
125.0000 mg | Freq: Once | INTRAMUSCULAR | Status: AC
  Administered 2016-07-28: 125 mg via INTRAMUSCULAR
  Filled 2016-07-28: qty 2

## 2016-07-28 NOTE — ED Triage Notes (Signed)
Pt states that over a week ago her back started hurting and then today the pain has started to radiate down right leg - pt states that while at work today she fell twice due to the pain

## 2016-07-28 NOTE — ED Provider Notes (Signed)
[facility] Emergency Department Provider Note  ____________________________________________  Time seen: Approximately 7:10 PM  I have reviewed the triage vital signs and the nursing notes.   HISTORY  Chief Complaint Back Pain    HPI Holly Gordon is a 36 y.o. female that presents to the emergency department with low back pain for one week. She states that pain begins in the middle of her back and radiates down her right leg. 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 She denies any trauma. She has been taking ibuprofen for pain, which is not helping. No fever, shortness of breath, chest pain, nausea, vomiting, abdominal pain, bowel or bladder dysfunction, saddle paresthesias, numbness, tingling, dysuria, urgency, frequency.   Past Medical History:  Diagnosis Date  . Anemia   . Anxiety   . Depression   . History of abnormal cervical Pap smear     There are no active problems to display for this patient.   Past Surgical History:  Procedure Laterality Date  . CESAREAN SECTION  2008   x1  . COLPOSCOPY     abnormal pap  . INDUCED ABORTION    . TONSILLECTOMY    . WISDOM TOOTH EXTRACTION     x4    Prior to Admission medications   Medication Sig Start Date End Date Taking? Authorizing Provider  benzonatate (TESSALON PERLES) 100 MG capsule Take 2 capsules (200 mg total) by mouth 3 (three) times daily as needed for cough. 08/01/15 07/31/16  Tommi Rumps, PA-C  ibuprofen (ADVIL,MOTRIN) 800 MG tablet Take 1 tablet (800 mg total) by mouth every 8 (eight) hours as needed. 07/28/16   Enid Derry, PA-C  naproxen (NAPROSYN) 500 MG tablet Take 1 tablet (500 mg total) by mouth 2 (two) times daily with a meal. 08/30/15 08/29/16  Triplett, Cari B, FNP  Norethindrone-Ethinyl Estradiol-Fe Biphas (LO LOESTRIN FE) 1 MG-10 MCG / 10 MCG tablet Take 1 tablet by mouth daily. 10/31/11 10/30/12  Anyanwu, Jethro Bastos, MD  ondansetron (ZOFRAN ODT) 4 MG  disintegrating tablet Take 1 tablet (4 mg total) by mouth every 8 (eight) hours as needed for nausea or vomiting. 08/01/15   Tommi Rumps, PA-C  predniSONE (STERAPRED UNI-PAK 21 TAB) 10 MG (21) TBPK tablet Take 6 tablets on day 1, take 5 tablets on day 2, take 4 tablets on day 3, take 3 tablets on day 4, take 2 tablets on day 5, take 1 tablet on day 6 07/28/16   Enid Derry, PA-C  traMADol (ULTRAM) 50 MG tablet Take 1 tablet (50 mg total) by mouth every 6 (six) hours as needed. 08/30/15   Triplett, Rulon Eisenmenger B, FNP    Allergies Sulfa antibiotics and Morphine and related  Family History  Problem Relation Age of Onset  . Hypertension Father   . Kidney disease Maternal Grandmother   . Heart disease Maternal Grandmother   . Alzheimer's disease Maternal Grandfather     Social History Social History  Substance Use Topics  . Smoking status: Current Every Day Smoker    Packs/day: 1.50    Types: Cigarettes  . Smokeless tobacco: Never Used  . Alcohol use No     Review of Systems  Constitutional: No fever/chills Cardiovascular: No chest pain. Respiratory: No SOB. Gastrointestinal: No abdominal pain.  No nausea, no vomiting.  Musculoskeletal: Positive for back pain. Skin: Negative for rash, abrasions, lacerations, ecchymosis. Neurological: Negative for headaches, numbness or tingling   ____________________________________________   PHYSICAL EXAM:  VITAL SIGNS: ED Triage Vitals  Enc Vitals Group     BP 07/28/16 1830 137/90     Pulse Rate 07/28/16 1830 85     Resp 07/28/16 1830 16     Temp 07/28/16 1830 98.4 F (36.9 C)     Temp Source 07/28/16 1830 Oral     SpO2 07/28/16 1830 100 %     Weight 07/28/16 1830 180 lb (81.6 kg)     Height 07/28/16 1830 5\' 4"  (1.626 m)     Head Circumference --      Peak Flow --      Pain Score 07/28/16 1834 8     Pain Loc --      Pain Edu? --      Excl. in GC? --      Constitutional: Alert and oriented. Well appearing and in no acute  distress. Eyes: Conjunctivae are normal. PERRL. EOMI. Head: Atraumatic. ENT:      Ears:      Nose: No congestion/rhinnorhea.      Mouth/Throat: Mucous membranes are moist.  Neck: No stridor.   Cardiovascular: Normal rate, regular rhythm.  Good peripheral circulation. Respiratory: Normal respiratory effort without tachypnea or retractions. Lungs CTAB. Good air entry to the bases with no decreased or absent breath sounds. Gastrointestinal: Bowel sounds 4 quadrants. Soft and nontender to palpation. No guarding or rigidity. No palpable masses. No distention.  Musculoskeletal: Full range of motion to all extremities. No gross deformities appreciated. Tenderness to palpation over right SI joint. Positive straight leg raise. Neurologic:  Normal speech and language. No gross focal neurologic deficits are appreciated.  Skin:  Skin is warm, dry and intact. No rash noted.   ____________________________________________   LABS (all labs ordered are listed, but only abnormal results are displayed)  Labs Reviewed - No data to display ____________________________________________  EKG   ____________________________________________  RADIOLOGY  No results found.  ____________________________________________    PROCEDURES  Procedure(s) performed:    Procedures    Medications  methylPREDNISolone sodium succinate (SOLU-MEDROL) 125 mg/2 mL injection 125 mg (125 mg Intramuscular Given 07/28/16 1929)     ____________________________________________   INITIAL IMPRESSION / ASSESSMENT AND PLAN / ED COURSE  Pertinent labs & imaging results that were available during my care of the patient were reviewed by me and considered in my medical decision making (see chart for details).  Review of the [place] CSRS was performed in accordance of the [facility] prior to dispensing any controlled drugs.     Patient's diagnosis is consistent with low back pain with sciatica. Vital signs and exam are  reassuring. Patient denies any trauma. She is moving around the room without difficulty. IM Solu-Medrol was given. Patient will be discharged home with prescriptions for prednisone and ibuprofen. Patient is to follow up with PCP as directed. Patient is given ED precautions to return to the ED for any worsening or new symptoms.     ____________________________________________  FINAL CLINICAL IMPRESSION(S) / ED DIAGNOSES  Final diagnoses:  Sciatica of right side      NEW MEDICATIONS STARTED DURING THIS VISIT:  Discharge Medication List as of 07/28/2016  7:13 PM    START taking these medications   Details  ibuprofen (ADVIL,MOTRIN) 800 MG tablet Take 1 tablet (800 mg total) by mouth every 8 (eight) hours as needed., Starting Mon 07/28/2016, Print    predniSONE (STERAPRED UNI-PAK 21 TAB) 10 MG (21) TBPK tablet Take 6 tablets on day 1, take 5 tablets on day 2, take  4 tablets on day 3, take 3 tablets on day 4, take 2 tablets on day 5, take 1 tablet on day 6, Print            This chart was dictated using voice recognition software/Dragon. Despite best efforts to proofread, errors can occur which can change the meaning. Any change was purely unintentional.    Enid Derry, PA-C 07/28/16 1959    Merrily Brittle, MD 07/28/16 [id number]

## 2016-08-23 ENCOUNTER — Emergency Department
Admission: EM | Admit: 2016-08-23 | Discharge: 2016-08-23 | Disposition: A | Discharge status: Home / Self Care | Payer: Medicaid Other | Attending: Emergency Medicine | Admitting: Emergency Medicine

## 2016-08-23 ENCOUNTER — Encounter: Payer: Self-pay | Admitting: *Deleted

## 2016-08-23 DIAGNOSIS — O99281 Endocrine, nutritional and metabolic diseases complicating pregnancy, first trimester: Secondary | ICD-10-CM | POA: Diagnosis not present

## 2016-08-23 DIAGNOSIS — O210 Mild hyperemesis gravidarum: Secondary | ICD-10-CM

## 2016-08-23 DIAGNOSIS — E860 Dehydration: Secondary | ICD-10-CM

## 2016-08-23 DIAGNOSIS — F17210 Nicotine dependence, cigarettes, uncomplicated: Secondary | ICD-10-CM | POA: Diagnosis not present

## 2016-08-23 DIAGNOSIS — Z3A01 Less than 8 weeks gestation of pregnancy: Secondary | ICD-10-CM | POA: Insufficient documentation

## 2016-08-23 DIAGNOSIS — R110 Nausea: Secondary | ICD-10-CM | POA: Diagnosis present

## 2016-08-23 DIAGNOSIS — O26891 Other specified pregnancy related conditions, first trimester: Secondary | ICD-10-CM | POA: Diagnosis not present

## 2016-08-23 DIAGNOSIS — Z3491 Encounter for supervision of normal pregnancy, unspecified, first trimester: Secondary | ICD-10-CM

## 2016-08-23 LAB — COMPREHENSIVE METABOLIC PANEL
ALBUMIN: 4.3 g/dL (ref 3.5–5.0)
ALT: 16 U/L (ref 14–54)
ANION GAP: 8 (ref 5–15)
AST: 24 U/L (ref 15–41)
Alkaline Phosphatase: 63 U/L (ref 38–126)
BUN: <5 mg/dL — ABNORMAL LOW (ref 6–20)
CHLORIDE: 103 mmol/L (ref 101–111)
CO2: 27 mmol/L (ref 22–32)
Calcium: 9.2 mg/dL (ref 8.9–10.3)
Creatinine, Ser: 0.56 mg/dL (ref 0.44–1.00)
GFR calc non Af Amer: >60 mL/min (ref >60)
Glucose, Bld: 93 mg/dL (ref 65–99)
Potassium: 3.1 mmol/L — ABNORMAL LOW (ref 3.5–5.1)
SODIUM: 138 mmol/L (ref 135–145)
Total Bilirubin: 0.7 mg/dL (ref 0.3–1.2)
Total Protein: 7.3 g/dL (ref 6.5–8.1)

## 2016-08-23 LAB — CBC
HCT: 40.6 % (ref 35.0–47.0)
HEMOGLOBIN: 14.1 g/dL (ref 12.0–16.0)
MCH: 32 pg (ref 26.0–34.0)
MCHC: 34.8 g/dL (ref 32.0–36.0)
MCV: 92 fL (ref 80.0–100.0)
Platelets: 228 10*3/uL (ref 150–440)
RBC: 4.41 MIL/uL (ref 3.80–5.20)
RDW: 13.4 % (ref 11.5–14.5)
WBC: 7.7 10*3/uL (ref 3.6–11.0)

## 2016-08-23 LAB — URINALYSIS, COMPLETE (UACMP) WITH MICROSCOPIC
Bilirubin Urine: NEGATIVE
Glucose, UA: NEGATIVE mg/dL
Hgb urine dipstick: NEGATIVE
KETONES UR: NEGATIVE mg/dL
Nitrite: NEGATIVE
PROTEIN: NEGATIVE mg/dL
Specific Gravity, Urine: 1.011 (ref 1.005–1.030)
pH: 6 (ref 5.0–8.0)

## 2016-08-23 LAB — LIPASE, BLOOD: LIPASE: 21 U/L (ref 11–51)

## 2016-08-23 LAB — POCT PREGNANCY, URINE: Preg Test, Ur: POSITIVE — AB

## 2016-08-23 MED ORDER — DIPHENHYDRAMINE HCL 50 MG/ML IJ SOLN
25.0000 mg | Freq: Once | INTRAMUSCULAR | Status: AC
  Administered 2016-08-23: 25 mg via INTRAVENOUS
  Filled 2016-08-23: qty 1

## 2016-08-23 MED ORDER — METOCLOPRAMIDE HCL 5 MG/ML IJ SOLN
10.0000 mg | Freq: Once | INTRAMUSCULAR | Status: AC
  Administered 2016-08-23: 10 mg via INTRAVENOUS
  Filled 2016-08-23: qty 2

## 2016-08-23 MED ORDER — SODIUM CHLORIDE 0.9 % IV BOLUS (SEPSIS)
1000.0000 mL | Freq: Once | INTRAVENOUS | Status: AC
  Administered 2016-08-23: 1000 mL via INTRAVENOUS

## 2016-08-23 NOTE — ED Notes (Signed)
Patient reports relief of nausea.

## 2016-08-23 NOTE — ED Notes (Signed)
Patient requesting to be discharged immediately. Pt requested additional testing from MD prior to discharge request. MD to bedside to discuss patient's concerns.  No further testing was ordered. Pt refused to have final vital signs taken or to sign for discharge.   RN reviewed d/c instructions and follow-up care. Pt verbalized understanding.

## 2016-08-23 NOTE — ED Triage Notes (Signed)
Pt  To triage via wheelchair. Pt has nausea, abd pain and dizziness.  Pt is approx [redacted] weeks pregnant.  [facility] at [facility]   No vag bleeding  No dysuria.  Pt alert.

## 2016-08-23 NOTE — ED Provider Notes (Signed)
[facility] Emergency Department Provider Note  ____________________________________________  Time seen: Approximately 10:40 PM  I have reviewed the triage vital signs and the nursing notes.   HISTORY  Chief Complaint Dizziness    HPI Holly Gordon is a 36 y.o. female who complains of crampy low back pain occasionally, lightheadedness on standing, nausea without vomiting for the past 2 days. She is [redacted] weeks pregnant. No vaginal bleeding dysuria or vaginal discharge. No syncope.the low back pain is very intermittent and not present currently. No aggravating or alleviating factors. Nonradiating. Mild to moderate intensity.  She is worried that her 7 week pregnancy May be about to miscarry. Again no pelvic pain or vaginal bleeding.     Past Medical History:  Diagnosis Date  . Anemia   . Anxiety   . Depression   . History of abnormal cervical Pap smear      There are no active problems to display for this patient.    Past Surgical History:  Procedure Laterality Date  . CESAREAN SECTION  2008   x1  . COLPOSCOPY     abnormal pap  . INDUCED ABORTION    . TONSILLECTOMY    . WISDOM TOOTH EXTRACTION     x4     Prior to Admission medications   Medication Sig Start Date End Date Taking? Authorizing Provider  ibuprofen (ADVIL,MOTRIN) 800 MG tablet Take 1 tablet (800 mg total) by mouth every 8 (eight) hours as needed. 07/28/16   Enid Derry, PA-C  naproxen (NAPROSYN) 500 MG tablet Take 1 tablet (500 mg total) by mouth 2 (two) times daily with a meal. 08/30/15 08/29/16  Triplett, Cari B, FNP  Norethindrone-Ethinyl Estradiol-Fe Biphas (LO LOESTRIN FE) 1 MG-10 MCG / 10 MCG tablet Take 1 tablet by mouth daily. 10/31/11 10/30/12  Anyanwu, Jethro Bastos, MD  ondansetron (ZOFRAN ODT) 4 MG disintegrating tablet Take 1 tablet (4 mg total) by mouth every 8 (eight) hours as needed for nausea or vomiting. 08/01/15   Tommi Rumps, PA-C  predniSONE (STERAPRED  UNI-PAK 21 TAB) 10 MG (21) TBPK tablet Take 6 tablets on day 1, take 5 tablets on day 2, take 4 tablets on day 3, take 3 tablets on day 4, take 2 tablets on day 5, take 1 tablet on day 6 07/28/16   Enid Derry, PA-C  traMADol (ULTRAM) 50 MG tablet Take 1 tablet (50 mg total) by mouth every 6 (six) hours as needed. 08/30/15   Triplett, Rulon Eisenmenger B, FNP     Allergies Sulfa antibiotics and Morphine and related   Family History  Problem Relation Age of Onset  . Hypertension Father   . Kidney disease Maternal Grandmother   . Heart disease Maternal Grandmother   . Alzheimer's disease Maternal Grandfather     Social History Social History  Substance Use Topics  . Smoking status: Current Every Day Smoker    Packs/day: 1.50    Types: Cigarettes  . Smokeless tobacco: Never Used  . Alcohol use No    Review of Systems  Constitutional:   No fever or chills.  ENT:   No sore throat. No rhinorrhea. Cardiovascular:   No chest pain or syncope. Respiratory:   No dyspnea or cough. Gastrointestinal:   Negative for abdominal pain, vomiting and diarrhea.  Musculoskeletal:   Low back pain as above All other systems reviewed and are negative except as documented above in ROS and HPI.  ____________________________________________   PHYSICAL EXAM:  VITAL SIGNS: ED Triage Vitals  Enc Vitals Group     BP 08/23/16 1835 137/77     Pulse Rate 08/23/16 1835 88     Resp 08/23/16 1835 18     Temp 08/23/16 1835 98.6 F (37 C)     Temp Source 08/23/16 1835 Oral     SpO2 08/23/16 1835 99 %     Weight 08/23/16 1832 169 lb (76.7 kg)     Height 08/23/16 1832 5\' 4"  (1.626 m)     Head Circumference --      Peak Flow --      Pain Score 08/23/16 1831 6     Pain Loc --      Pain Edu? --      Excl. in GC? --     Vital signs reviewed, nursing assessments reviewed.   Constitutional:   Alert and oriented. Well appearing and in no distress. Eyes:   No scleral icterus.  EOMI. No nystagmus. No conjunctival  pallor. PERRL. ENT   Head:   Normocephalic and atraumatic.   Nose:   No congestion/rhinnorhea.    Mouth/Throat:   MMM, no pharyngeal erythema. No peritonsillar mass.    Neck:   No meningismus. Full ROM Hematological/Lymphatic/Immunilogical:   No cervical lymphadenopathy. Cardiovascular:   RRR. Symmetric bilateral radial and DP pulses.  No murmurs.  Respiratory:   Normal respiratory effort without tachypnea/retractions. Breath sounds are clear and equal bilaterally. No wheezes/rales/rhonchi. Gastrointestinal:   Soft and nontender. Non distended. There is no CVA tenderness.  No rebound, rigidity, or guarding. Genitourinary:   deferred Musculoskeletal:   Normal range of motion in all extremities. No joint effusions.  No lower extremity tenderness.  No edema. Neurologic:   Normal speech and language.  Motor grossly intact. No gross focal neurologic deficits are appreciated.  Skin:    Skin is warm, dry and intact. No rash noted.  No petechiae, purpura, or bullae.  ____________________________________________    LABS (pertinent positives/negatives) (all labs ordered are listed, but only abnormal results are displayed) Labs Reviewed  COMPREHENSIVE METABOLIC PANEL - Abnormal; Notable for the following:       Result Value   Potassium 3.1 (*)    BUN <5 (*)    All other components within normal limits  URINALYSIS, COMPLETE (UACMP) WITH MICROSCOPIC - Abnormal; Notable for the following:    Color, Urine YELLOW (*)    APPearance HAZY (*)    Leukocytes, UA SMALL (*)    Bacteria, UA RARE (*)    Squamous Epithelial / LPF 6-30 (*)    All other components within normal limits  POCT PREGNANCY, URINE - Abnormal; Notable for the following:    Preg Test, Ur POSITIVE (*)    All other components within normal limits  LIPASE, BLOOD  CBC  POC URINE PREG, ED   ____________________________________________   EKG    ____________________________________________    RADIOLOGY  No  results found.  ____________________________________________   PROCEDURES Procedures  ____________________________________________   INITIAL IMPRESSION / ASSESSMENT AND PLAN / ED COURSE  Pertinent labs & imaging results that were available during my care of the patient were reviewed by me and considered in my medical decision making (see chart for details).    Clinical Course as of Aug 24 2238  Sat Aug 23, 2016  1923 Abdomen benign. Vs nl. Dehydration / morning sickness. IVF, antiemetics, plan for outpt follow up.   [PS]    Clinical Course User Index [PS] Sharman Cheek, MD     ----------------------------------------- 10:42 PM on  08/23/2016 -----------------------------------------  Workup negative. Patient tolerating oral intake. Vital signs normal. Will discharge to outpatient follow-up. No evidence of STI or PID TOA ectopic or torsion.Considering the patient's symptoms, medical history, and physical examination today, I have low suspicion for cholecystitis or biliary pathology, pancreatitis, perforation or bowel obstruction, hernia, intra-abdominal abscess, AAA or dissection, volvulus or intussusception, mesenteric ischemia, or appendicitis.    ____________________________________________   FINAL CLINICAL IMPRESSION(S) / ED DIAGNOSES  Final diagnoses:  Dehydration  First trimester pregnancy  Morning sickness      New Prescriptions   No medications on file     Portions of this note were generated with dragon dictation software. Dictation errors may occur despite best attempts at proofreading.    Sharman CheekStafford, Sagan Maselli, MD 08/23/16 2242

## 2016-08-23 NOTE — Discharge Instructions (Signed)
Results for orders placed or performed during the hospital encounter of 08/23/16  Lipase, blood  Result Value Ref Range   Lipase 21 11 - 51 U/L  Comprehensive metabolic panel  Result Value Ref Range   Sodium 138 135 - 145 mmol/L   Potassium 3.1 (L) 3.5 - 5.1 mmol/L   Chloride 103 101 - 111 mmol/L   CO2 27 22 - 32 mmol/L   Glucose, Bld 93 65 - 99 mg/dL   BUN <5 (L) 6 - 20 mg/dL   Creatinine, Ser 7.820.56 0.44 - 1.00 mg/dL   Calcium 9.2 8.9 - 95.610.3 mg/dL   Total Protein 7.3 6.5 - 8.1 g/dL   Albumin 4.3 3.5 - 5.0 g/dL   AST 24 15 - 41 U/L   ALT 16 14 - 54 U/L   Alkaline Phosphatase 63 38 - 126 U/L   Total Bilirubin 0.7 0.3 - 1.2 mg/dL   GFR calc non Af Amer >60 >60 mL/min   GFR calc Af Amer >60 >60 mL/min   Anion gap 8 5 - 15  CBC  Result Value Ref Range   WBC 7.7 3.6 - 11.0 K/uL   RBC 4.41 3.80 - 5.20 MIL/uL   Hemoglobin 14.1 12.0 - 16.0 g/dL   HCT 21.340.6 08.635.0 - 57.847.0 %   MCV 92.0 80.0 - 100.0 fL   MCH 32.0 26.0 - 34.0 pg   MCHC 34.8 32.0 - 36.0 g/dL   RDW 46.913.4 62.911.5 - 52.814.5 %   Platelets 228 150 - 440 K/uL  Urinalysis, Complete w Microscopic  Result Value Ref Range   Color, Urine YELLOW (A) YELLOW   APPearance HAZY (A) CLEAR   Specific Gravity, Urine 1.011 1.005 - 1.030   pH 6.0 5.0 - 8.0   Glucose, UA NEGATIVE NEGATIVE mg/dL   Hgb urine dipstick NEGATIVE NEGATIVE   Bilirubin Urine NEGATIVE NEGATIVE   Ketones, ur NEGATIVE NEGATIVE mg/dL   Protein, ur NEGATIVE NEGATIVE mg/dL   Nitrite NEGATIVE NEGATIVE   Leukocytes, UA SMALL (A) NEGATIVE   RBC / HPF 0-5 0 - 5 RBC/hpf   WBC, UA 0-5 0 - 5 WBC/hpf   Bacteria, UA RARE (A) NONE SEEN   Squamous Epithelial / LPF 6-30 (A) NONE SEEN   Mucous PRESENT   Pregnancy, urine POC  Result Value Ref Range   Preg Test, Ur POSITIVE (A) NEGATIVE   No results found.

## 2016-08-25 ENCOUNTER — Telehealth: Payer: Self-pay | Admitting: *Deleted

## 2016-08-25 ENCOUNTER — Inpatient Hospital Stay (HOSPITAL_COMMUNITY): Payer: Medicaid Other

## 2016-08-25 ENCOUNTER — Inpatient Hospital Stay (HOSPITAL_COMMUNITY)
Admission: AD | Admit: 2016-08-25 | Discharge: 2016-08-26 | Disposition: A | Discharge status: Home / Self Care | Payer: Medicaid Other | Source: Ambulatory Visit | Attending: Obstetrics and Gynecology | Admitting: Obstetrics and Gynecology

## 2016-08-25 ENCOUNTER — Encounter (HOSPITAL_COMMUNITY): Payer: Self-pay

## 2016-08-25 DIAGNOSIS — F17210 Nicotine dependence, cigarettes, uncomplicated: Secondary | ICD-10-CM | POA: Diagnosis not present

## 2016-08-25 DIAGNOSIS — Z3A33 33 weeks gestation of pregnancy: Secondary | ICD-10-CM | POA: Insufficient documentation

## 2016-08-25 DIAGNOSIS — O99331 Smoking (tobacco) complicating pregnancy, first trimester: Secondary | ICD-10-CM | POA: Diagnosis not present

## 2016-08-25 DIAGNOSIS — R102 Pelvic and perineal pain: Secondary | ICD-10-CM | POA: Diagnosis not present

## 2016-08-25 DIAGNOSIS — N760 Acute vaginitis: Secondary | ICD-10-CM

## 2016-08-25 DIAGNOSIS — O468X1 Other antepartum hemorrhage, first trimester: Secondary | ICD-10-CM

## 2016-08-25 DIAGNOSIS — O26891 Other specified pregnancy related conditions, first trimester: Secondary | ICD-10-CM | POA: Insufficient documentation

## 2016-08-25 DIAGNOSIS — B9689 Other specified bacterial agents as the cause of diseases classified elsewhere: Secondary | ICD-10-CM | POA: Diagnosis present

## 2016-08-25 DIAGNOSIS — R109 Unspecified abdominal pain: Secondary | ICD-10-CM | POA: Diagnosis not present

## 2016-08-25 DIAGNOSIS — O418X10 Other specified disorders of amniotic fluid and membranes, first trimester, not applicable or unspecified: Secondary | ICD-10-CM

## 2016-08-25 DIAGNOSIS — O26899 Other specified pregnancy related conditions, unspecified trimester: Secondary | ICD-10-CM

## 2016-08-25 HISTORY — DX: Other specified bacterial agents as the cause of diseases classified elsewhere: B96.89

## 2016-08-25 HISTORY — DX: Acute vaginitis: N76.0

## 2016-08-25 LAB — URINALYSIS, ROUTINE W REFLEX MICROSCOPIC
Bilirubin Urine: NEGATIVE
Glucose, UA: NEGATIVE mg/dL
Hgb urine dipstick: NEGATIVE
Ketones, ur: NEGATIVE mg/dL
LEUKOCYTES UA: NEGATIVE
Nitrite: NEGATIVE
PROTEIN: NEGATIVE mg/dL
Specific Gravity, Urine: 1.008 (ref 1.005–1.030)
pH: 5 (ref 5.0–8.0)

## 2016-08-25 NOTE — MAU Provider Note (Signed)
History     CSN: [id number]  Arrival date and time: 08/25/16 1813   None     Chief Complaint  Patient presents with  . Abdominal Cramping   Ms. Holly Gordon is a 36 yo G5P2022 at 7.[redacted] wks gestation presenting to MAU with complaints of lower abdominal pain x 4 days.  She was seen at [facility] on 08/22/16 for this complaint and had an ultrasound that "did not show anything".  She wants to know if everything is okay with the baby.   Abdominal Pain  This is a recurrent problem. The current episode started in the past 7 days. The onset quality is sudden. The problem occurs daily. The most recent episode lasted 4 days. The problem has been unchanged. The pain is located in the suprapubic region. The pain is at a severity of 6/10. The pain is moderate. The quality of the pain is cramping. The abdominal pain does not radiate. Associated symptoms include nausea. Nothing aggravates the pain. The pain is relieved by nothing. She has tried nothing for the symptoms.    Past Medical History:  Diagnosis Date  . Anemia   . Anxiety   . Depression   . History of abnormal cervical Pap smear     Past Surgical History:  Procedure Laterality Date  . CESAREAN SECTION  2008   x1  . COLPOSCOPY     abnormal pap  . INDUCED ABORTION    . TONSILLECTOMY    . WISDOM TOOTH EXTRACTION     x4    Family History  Problem Relation Age of Onset  . Hypertension Father   . Kidney disease Maternal Grandmother   . Heart disease Maternal Grandmother   . Alzheimer's disease Maternal Grandfather     Social History  Substance Use Topics  . Smoking status: Current Every Day Smoker    Packs/day: 1.50    Types: Cigarettes  . Smokeless tobacco: Never Used  . Alcohol use No    Allergies:  Allergies  Allergen Reactions  . Sulfa Antibiotics   . Morphine And Related Rash    Prescriptions Prior to Admission  Medication Sig Dispense Refill Last Dose  . ibuprofen (ADVIL,MOTRIN) 800 MG tablet Take 1 tablet  (800 mg total) by mouth every 8 (eight) hours as needed. 30 tablet 0   . naproxen (NAPROSYN) 500 MG tablet Take 1 tablet (500 mg total) by mouth 2 (two) times daily with a meal. 60 tablet 0   . Norethindrone-Ethinyl Estradiol-Fe Biphas (LO LOESTRIN FE) 1 MG-10 MCG / 10 MCG tablet Take 1 tablet by mouth daily. 2 Package 0   . ondansetron (ZOFRAN ODT) 4 MG disintegrating tablet Take 1 tablet (4 mg total) by mouth every 8 (eight) hours as needed for nausea or vomiting. 20 tablet 0   . predniSONE (STERAPRED UNI-PAK 21 TAB) 10 MG (21) TBPK tablet Take 6 tablets on day 1, take 5 tablets on day 2, take 4 tablets on day 3, take 3 tablets on day 4, take 2 tablets on day 5, take 1 tablet on day 6 21 tablet 0   . traMADol (ULTRAM) 50 MG tablet Take 1 tablet (50 mg total) by mouth every 6 (six) hours as needed. 12 tablet 0     Review of Systems  Constitutional: Negative.   HENT: Negative.   Eyes: Negative.   Respiratory: Negative.   Cardiovascular: Negative.   Gastrointestinal: Positive for abdominal pain and nausea.  Endocrine: Negative.   Genitourinary: Positive for pelvic pain.  Negative for vaginal bleeding and vaginal discharge.  Musculoskeletal: Negative.   Skin: Negative.   Allergic/Immunologic: Negative.   Neurological: Negative.   Hematological: Negative.   Psychiatric/Behavioral: Negative.    Physical Exam   Blood pressure 133/76, pulse 82, temperature 98.4 F (36.9 C), temperature source Oral, resp. rate 18, height 5\' 4"  (1.626 m), weight 76.7 kg (169 lb), last menstrual period 07/04/2016, SpO2 100 %.  Physical Exam  Constitutional: She is oriented to person, place, and time. She appears well-developed and well-nourished.  HENT:  Head: Normocephalic.  Eyes: Pupils are equal, round, and reactive to light.  Neck: Normal range of motion.  Cardiovascular: Normal rate, regular rhythm, normal heart sounds and intact distal pulses.   Respiratory: Effort normal and breath sounds normal.   GI: Soft. Bowel sounds are normal.  Genitourinary: Vagina normal. Pelvic exam was performed with patient supine. Uterus is enlarged (slightly).  Genitourinary Comments: Uterus: slightly enlarged, cx; smooth, pink, no lesions, scant amt of white d/c, closed/long/firm, no CMT or friability, no adnexal tenderness    Musculoskeletal: Normal range of motion.  Neurological: She is alert and oriented to person, place, and time. She has normal reflexes.  Skin: Skin is warm and dry.  Psychiatric: She has a normal mood and affect. Her behavior is normal. Judgment and thought content normal.    MAU Course  Procedures  MDM [facility] OB U/S <14wks/OB transvaginal U/S: SIUP @ 7.1 wks with (+) YS, (+) embryo, (+) FHR @ 140 bpm, possble small SCH, RT CLC, Notation-> Prominent appearing LT adnexal vein ~10 mm; which appears to respond to Valsalva Maneuvers, ? Pelvic congestion  Wet Prep GC/CT  Results for orders placed or performed during the hospital encounter of 08/25/16 (from the past 24 hour(s))  Urinalysis, Routine w reflex microscopic     Status: Abnormal   Collection Time: 08/25/16  6:39 PM  Result Value Ref Range   Color, Urine YELLOW YELLOW   APPearance HAZY (A) CLEAR   Specific Gravity, Urine 1.008 1.005 - 1.030   pH 5.0 5.0 - 8.0   Glucose, UA NEGATIVE NEGATIVE mg/dL   Hgb urine dipstick NEGATIVE NEGATIVE   Bilirubin Urine NEGATIVE NEGATIVE   Ketones, ur NEGATIVE NEGATIVE mg/dL   Protein, ur NEGATIVE NEGATIVE mg/dL   Nitrite NEGATIVE NEGATIVE   Leukocytes, UA NEGATIVE NEGATIVE   Assessment and Plan  Abdominal pain in pregnancy, first trimester  - Instructions given - F/U with [facility] - Return to MAU for worsening condition and emergencies  Subchorionic hematoma in first trimester, single or unspecified fetus - Instructions given  Discharge home  *TC to pt at 0016 on 08/26/16 to notify about BV and Rx needed - desires Flagyl pills - Rx sent Patient verbalized an understanding of  the plan of care and agrees.  Raelyn Moraolitta Macklen Wilhoite, MSN, CNM 08/25/2016, 11:47 PM

## 2016-08-25 NOTE — Telephone Encounter (Signed)
Pt called in stating she went to [facility] ED. They performed abd US and was not able to hear FHR. She is concerned that something may be wrong with pregnancy. Advised pt to report to MAU for eval and viability US. Pt expressed understanding.

## 2016-08-25 NOTE — MAU Note (Signed)
Pt presents to MAU with complaints of lower abdominal cramping. [place] Friday [place] but pt states that she continues to have pain. Denies any VB or abnormal discharge

## 2016-08-26 ENCOUNTER — Encounter (HOSPITAL_COMMUNITY): Payer: Self-pay | Admitting: Obstetrics and Gynecology

## 2016-08-26 DIAGNOSIS — N760 Acute vaginitis: Secondary | ICD-10-CM

## 2016-08-26 DIAGNOSIS — O418X10 Other specified disorders of amniotic fluid and membranes, first trimester, not applicable or unspecified: Secondary | ICD-10-CM | POA: Diagnosis present

## 2016-08-26 DIAGNOSIS — R109 Unspecified abdominal pain: Secondary | ICD-10-CM

## 2016-08-26 DIAGNOSIS — O26891 Other specified pregnancy related conditions, first trimester: Secondary | ICD-10-CM | POA: Diagnosis present

## 2016-08-26 DIAGNOSIS — B9689 Other specified bacterial agents as the cause of diseases classified elsewhere: Secondary | ICD-10-CM

## 2016-08-26 DIAGNOSIS — O468X1 Other antepartum hemorrhage, first trimester: Secondary | ICD-10-CM

## 2016-08-26 HISTORY — DX: Other specified bacterial agents as the cause of diseases classified elsewhere: B96.89

## 2016-08-26 LAB — WET PREP, GENITAL
SPERM: NONE SEEN
Trich, Wet Prep: NONE SEEN
YEAST WET PREP: NONE SEEN

## 2016-08-26 LAB — GC/CHLAMYDIA PROBE AMP (~~LOC~~) NOT AT ARMC
Chlamydia: NEGATIVE
Neisseria Gonorrhea: NEGATIVE

## 2016-08-26 MED ORDER — METRONIDAZOLE 500 MG PO TABS: 500.0000 mg | ORAL_TABLET | Freq: Two times a day (BID) | ORAL | 0 refills | Status: AC

## 2016-09-09 ENCOUNTER — Encounter: Payer: Self-pay | Admitting: Obstetrics and Gynecology

## 2016-09-09 ENCOUNTER — Encounter: Payer: Self-pay | Admitting: *Deleted

## 2016-09-09 ENCOUNTER — Other Ambulatory Visit (HOSPITAL_COMMUNITY)
Admission: RE | Admit: 2016-09-09 | Discharge: 2016-09-09 | Disposition: A | Discharge status: Home / Self Care | Payer: Medicaid Other | Source: Ambulatory Visit | Attending: Obstetrics and Gynecology | Admitting: Obstetrics and Gynecology

## 2016-09-09 ENCOUNTER — Ambulatory Visit (INDEPENDENT_AMBULATORY_CARE_PROVIDER_SITE_OTHER): Payer: Medicaid Other | Admitting: Obstetrics and Gynecology

## 2016-09-09 VITALS — BP 136/80 | HR 92 | Wt 169.0 lb

## 2016-09-09 DIAGNOSIS — Z8659 Personal history of other mental and behavioral disorders: Secondary | ICD-10-CM | POA: Insufficient documentation

## 2016-09-09 DIAGNOSIS — Z3481 Encounter for supervision of other normal pregnancy, first trimester: Secondary | ICD-10-CM

## 2016-09-09 DIAGNOSIS — F1911 Other psychoactive substance abuse, in remission: Secondary | ICD-10-CM

## 2016-09-09 DIAGNOSIS — O34219 Maternal care for unspecified type scar from previous cesarean delivery: Secondary | ICD-10-CM

## 2016-09-09 DIAGNOSIS — R87810 Cervical high risk human papillomavirus (HPV) DNA test positive: Secondary | ICD-10-CM | POA: Insufficient documentation

## 2016-09-09 DIAGNOSIS — O0991 Supervision of high risk pregnancy, unspecified, first trimester: Secondary | ICD-10-CM | POA: Insufficient documentation

## 2016-09-09 DIAGNOSIS — Z3480 Encounter for supervision of other normal pregnancy, unspecified trimester: Secondary | ICD-10-CM

## 2016-09-09 DIAGNOSIS — O99210 Obesity complicating pregnancy, unspecified trimester: Secondary | ICD-10-CM | POA: Insufficient documentation

## 2016-09-09 DIAGNOSIS — Z720 Tobacco use: Secondary | ICD-10-CM

## 2016-09-09 DIAGNOSIS — Z6825 Body mass index (BMI) 25.0-25.9, adult: Secondary | ICD-10-CM | POA: Insufficient documentation
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
## 2016-09-09 NOTE — Progress Notes (Signed)
Pt here for new ob visit. C/o vaginal itching since abx for BV.

## 2016-09-11 ENCOUNTER — Encounter: Payer: Self-pay | Admitting: Obstetrics and Gynecology

## 2016-09-11 LAB — GC/CHLAMYDIA PROBE AMP (~~LOC~~) NOT AT ARMC
Chlamydia: NEGATIVE
NEISSERIA GONORRHEA: NEGATIVE

## 2016-09-11 LAB — CYTOLOGY - PAP
Diagnosis: NEGATIVE
HPV (WINDOPATH): DETECTED — AB
HPV 16/18/45 genotyping: POSITIVE — AB

## 2016-09-11 NOTE — Telephone Encounter (Signed)
[facility] to [facility] via telephone and informed pt significance of results.

## 2016-09-15 ENCOUNTER — Telehealth: Payer: Self-pay | Admitting: *Deleted

## 2016-09-15 ENCOUNTER — Encounter: Payer: Self-pay | Admitting: Obstetrics and Gynecology

## 2016-09-15 DIAGNOSIS — N390 Urinary tract infection, site not specified: Secondary | ICD-10-CM

## 2016-09-15 DIAGNOSIS — R87810 Cervical high risk human papillomavirus (HPV) DNA test positive: Secondary | ICD-10-CM | POA: Insufficient documentation

## 2016-09-15 MED ORDER — CEPHALEXIN 500 MG PO CAPS: 500.0000 mg | ORAL_CAPSULE | Freq: Four times a day (QID) | ORAL | 2 refills | Status: DC

## 2016-09-15 NOTE — Telephone Encounter (Signed)
-----   Message from [place] Bingharlie Pickens, MD sent at 09/15/2016  8:44 AM EDT ----- Can you call her and let her know that her pap smear is slightly abnormal. We will need to do something after your delivery called a colposcopy to see if the abnormal cells on your cervix are still there after she delivers.  thanks

## 2016-09-15 NOTE — Telephone Encounter (Signed)
Called pt, no answer, left VM to call the office.  

## 2016-09-15 NOTE — Telephone Encounter (Signed)
-----   Message from Charlie Pickens, MD sent at 09/15/2016  8:44 AM EDT ----- Can you call her and let her know that her pap smear is slightly abnormal. We will need to do something after your delivery called a colposcopy to see if the abnormal cells on your cervix are still there after she delivers.  thanks  

## 2016-09-15 NOTE — Telephone Encounter (Signed)
Pt returned call, informed of result and recommendation.

## 2016-09-15 NOTE — Telephone Encounter (Signed)
Received fax from [facility] in regards to urine cx results.  Results reviewed with Dr Vergie LivingPickens, ordered Keflex four times daily for 7 days.  Rx sent to pharmacy.

## 2016-09-16 ENCOUNTER — Encounter: Payer: Self-pay | Admitting: Obstetrics and Gynecology

## 2016-09-17 ENCOUNTER — Encounter: Payer: Self-pay | Admitting: Obstetrics and Gynecology

## 2016-09-17 LAB — URINE CULTURE, OB REFLEX

## 2016-09-17 LAB — TOXASSURE SELECT 13 (MW), URINE

## 2016-09-17 LAB — CULTURE, OB URINE

## 2016-09-18 ENCOUNTER — Encounter: Payer: Self-pay | Admitting: Obstetrics and Gynecology

## 2016-09-18 DIAGNOSIS — F1210 Cannabis abuse, uncomplicated: Secondary | ICD-10-CM | POA: Insufficient documentation

## 2016-09-18 DIAGNOSIS — O2340 Unspecified infection of urinary tract in pregnancy, unspecified trimester: Secondary | ICD-10-CM | POA: Insufficient documentation

## 2016-09-22 LAB — OBSTETRIC PANEL, INCLUDING HIV
ANTIBODY SCREEN: NEGATIVE
BASOS: 0 %
Basophils Absolute: 0 10*3/uL (ref 0.0–0.2)
EOS (ABSOLUTE): 0.1 10*3/uL (ref 0.0–0.4)
Eos: 2 %
HEMATOCRIT: 40 % (ref 34.0–46.6)
HIV SCREEN 4TH GENERATION: NONREACTIVE
Hemoglobin: 13.6 g/dL (ref 11.1–15.9)
Hepatitis B Surface Ag: NEGATIVE
Immature Grans (Abs): 0 10*3/uL (ref 0.0–0.1)
Immature Granulocytes: 0 %
LYMPHS ABS: 2.3 10*3/uL (ref 0.7–3.1)
Lymphs: 33 %
MCH: 31.3 pg (ref 26.6–33.0)
MCHC: 34 g/dL (ref 31.5–35.7)
MCV: 92 fL (ref 79–97)
MONOCYTES: 8 %
MONOS ABS: 0.5 10*3/uL (ref 0.1–0.9)
NEUTROS ABS: 3.8 10*3/uL (ref 1.4–7.0)
Neutrophils: 57 %
PLATELETS: 253 10*3/uL (ref 150–379)
RBC: 4.34 x10E6/uL (ref 3.77–5.28)
RDW: 13.4 % (ref 12.3–15.4)
RPR Ser Ql: NONREACTIVE
RUBELLA: 4.19 {index} (ref >0.99)
Rh Factor: POSITIVE
WBC: 6.7 10*3/uL (ref 3.4–10.8)

## 2016-09-22 LAB — SMN1 COPY NUMBER ANALYSIS (SMA CARRIER SCREENING)

## 2016-09-22 LAB — HEMOGLOBINOPATHY EVALUATION
HEMOGLOBIN A2 QUANTITATION: 2.5 % (ref 1.8–3.2)
HGB C: 0 %
HGB S: 0 %
HGB VARIANT: 0 %
Hemoglobin F Quantitation: 0 % (ref 0.0–2.0)
Hgb A: 97.5 % (ref 96.4–98.8)

## 2016-09-22 LAB — CYSTIC FIBROSIS GENE TEST

## 2016-09-29 ENCOUNTER — Encounter: Payer: Self-pay | Admitting: Obstetrics and Gynecology

## 2016-09-30 ENCOUNTER — Other Ambulatory Visit: Payer: Self-pay | Admitting: Obstetrics and Gynecology

## 2016-09-30 ENCOUNTER — Ambulatory Visit (HOSPITAL_COMMUNITY): Admission: RE | Admit: 2016-09-30 | Payer: Medicaid Other | Source: Ambulatory Visit

## 2016-09-30 ENCOUNTER — Other Ambulatory Visit (HOSPITAL_COMMUNITY): Payer: Self-pay | Admitting: *Deleted

## 2016-09-30 ENCOUNTER — Encounter (HOSPITAL_COMMUNITY): Payer: Self-pay

## 2016-09-30 ENCOUNTER — Ambulatory Visit (HOSPITAL_COMMUNITY)
Admission: RE | Admit: 2016-09-30 | Discharge: 2016-09-30 | Disposition: A | Discharge status: Home / Self Care | Payer: Medicaid Other | Source: Ambulatory Visit | Attending: Obstetrics and Gynecology | Admitting: Obstetrics and Gynecology

## 2016-09-30 DIAGNOSIS — Z3682 Encounter for antenatal screening for nuchal translucency: Secondary | ICD-10-CM | POA: Diagnosis present

## 2016-09-30 DIAGNOSIS — Z3A12 12 weeks gestation of pregnancy: Secondary | ICD-10-CM

## 2016-09-30 DIAGNOSIS — Z3480 Encounter for supervision of other normal pregnancy, unspecified trimester: Secondary | ICD-10-CM

## 2016-10-01 ENCOUNTER — Telehealth: Payer: Self-pay

## 2016-10-01 NOTE — Telephone Encounter (Signed)
-----   Message from Lindell SparHeather L Bacon, [place] sent at 09/30/2016  1:23 PM EDT ----- Regarding: pt is having dizzy spells Contact: [phone] Please give pt a call about dizzy spells that she is having.   Thanks

## 2016-10-01 NOTE — Telephone Encounter (Signed)
Spoke with patient regarding her dizziness.  She says they are usually in the morning before she has eaten.  It also usually happens when she is in the shower.  Advised patient to be sure to stay hydrated and to eat.  Advised her this could be a normal side effect of pregnancy.  Patient declines appointment today, she has one scheduled for next week.  She will call if dizziness gets worse.

## 2016-10-03 ENCOUNTER — Encounter: Payer: Self-pay | Admitting: Obstetrics and Gynecology

## 2016-10-07 ENCOUNTER — Other Ambulatory Visit (HOSPITAL_COMMUNITY): Payer: Self-pay | Admitting: Obstetrics and Gynecology

## 2016-10-07 ENCOUNTER — Ambulatory Visit (INDEPENDENT_AMBULATORY_CARE_PROVIDER_SITE_OTHER): Payer: Medicaid Other | Admitting: Obstetrics & Gynecology

## 2016-10-07 ENCOUNTER — Ambulatory Visit (HOSPITAL_COMMUNITY): Admission: RE | Admit: 2016-10-07 | Payer: Medicaid Other | Source: Ambulatory Visit

## 2016-10-07 ENCOUNTER — Ambulatory Visit (HOSPITAL_COMMUNITY)
Admission: RE | Admit: 2016-10-07 | Discharge: 2016-10-07 | Disposition: A | Discharge status: Home / Self Care | Payer: Medicaid Other | Source: Ambulatory Visit | Attending: Obstetrics and Gynecology | Admitting: Obstetrics and Gynecology

## 2016-10-07 VITALS — BP 123/76 | HR 84 | Wt 171.0 lb

## 2016-10-07 DIAGNOSIS — O2341 Unspecified infection of urinary tract in pregnancy, first trimester: Secondary | ICD-10-CM

## 2016-10-07 DIAGNOSIS — Z3682 Encounter for antenatal screening for nuchal translucency: Secondary | ICD-10-CM

## 2016-10-07 DIAGNOSIS — Z3480 Encounter for supervision of other normal pregnancy, unspecified trimester: Secondary | ICD-10-CM

## 2016-10-07 DIAGNOSIS — Z3689 Encounter for other specified antenatal screening: Secondary | ICD-10-CM

## 2016-10-07 DIAGNOSIS — Z3A13 13 weeks gestation of pregnancy: Secondary | ICD-10-CM

## 2016-10-07 DIAGNOSIS — Z3481 Encounter for supervision of other normal pregnancy, first trimester: Secondary | ICD-10-CM

## 2016-10-07 DIAGNOSIS — L309 Dermatitis, unspecified: Secondary | ICD-10-CM

## 2016-10-07 DIAGNOSIS — O2340 Unspecified infection of urinary tract in pregnancy, unspecified trimester: Secondary | ICD-10-CM

## 2016-10-07 MED ORDER — TRIAMCINOLONE ACETONIDE 0.5 % EX OINT: 1.0000 "application " | TOPICAL_OINTMENT | Freq: Two times a day (BID) | CUTANEOUS | 6 refills | Status: DC

## 2016-10-07 NOTE — Progress Notes (Signed)
PRENATAL VISIT NOTE  Subjective:  Holly Gordon is a 36 y.o. Z6X0960 at [redacted]w[redacted]d being seen today for ongoing prenatal care.  She is currently monitored for the following issues for this low-risk pregnancy and has Abdominal pain during pregnancy in first trimester; Supervision of other normal pregnancy, antepartum; History of cesarean delivery affecting pregnancy; Obesity affecting pregnancy in first trimester; Body mass index (BMI) of 25.0 to 25.9 in adult; History of substance abuse; Tobacco abuse; History of depression; Imprisonment and other incarceration; Cervical dysplasia; Marijuana abuse; and UTI in pregnancy, antepartum on her problem list.  Patient reports rash on upper legs for a week, itchy. Had second failed attempt to do NT scan today.  Contractions: Not present. Vag. Bleeding: None.  Movement: Present. Denies leaking of fluid.   The following portions of the patient's history were reviewed and updated as appropriate: allergies, current medications, past family history, past medical history, past social history, past surgical history and problem list. Problem list updated.  Objective:   Vitals:   10/07/16 1311  BP: 123/76  Pulse: 84  Weight: 171 lb (77.6 kg)    Fetal Status: Fetal Heart Rate (bpm): + on u/s   Movement: Present     General:  Alert, oriented and cooperative. Patient is in no acute distress.  Skin: Skin is warm and dry. Rash noted on legs, see below.  Cardiovascular: Normal heart rate noted  Respiratory: Normal respiratory effort, no problems with respiration noted  Abdomen: Soft, gravid, appropriate for gestational age.  Pain/Pressure: Absent     Pelvic: Cervical exam deferred        Extremities: Normal range of motion.  Edema: None  Erythematous rash on anterior, upper part of thighs. Nothing on inner thighs.  Mental Status:  Normal mood and affect. Normal behavior. Normal judgment and thought content.   [place] Mfm Fetal Nuchal Translucency  Result  Date: 10/07/2016 ----------------------------------------------------------------------  OBSTETRICS REPORT                      (Signed Final 10/07/2016 01:04 pm) ---------------------------------------------------------------------- Patient Info  ID #:       [id number]                          D.O.B.:  05-25-1980 (36 yrs)  Name:       Holly Gordon [facility]            Visit Date: 10/07/2016 11:04 am ---------------------------------------------------------------------- Performed By  Performed By:     Tommi Emery         Ref. Address:     [address]                                                             [place], [place]  [address]  Attending:        Charlsie Merles MD         Location:         [facility]  Referred By:      [place] Bing MD ---------------------------------------------------------------------- Orders   #  Description                                 Code   1  [place] MFM FETAL NUCHAL                         [phone]      TRANSLUCENCY  ----------------------------------------------------------------------   #  Ordered By               Order #        Accession #    Episode #   1  MARK Ezzard Standing              [id number]      [id number]     [id number]  ---------------------------------------------------------------------- Indications   [redacted] weeks gestation of pregnancy                Z3A.13   Encounter for nuchal translucency              Z36.82  ---------------------------------------------------------------------- OB History  Blood Type:            Height:  5'4"   Weight (lb):  171       BMI:  29.35  Gravidity:    6         Term:   2        Prem:   0        SAB:   2  TOP:          1       Ectopic:  0        Living: 1 ---------------------------------------------------------------------- Fetal Evaluation  Num Of Fetuses:     1  Preg. Location:      Intrauterine  Gest. Sac:          Intrauterine  Fetal Pole:         Visualized  Fetal Heart         149  Rate(bpm):  Cardiac Activity:   Observed ---------------------------------------------------------------------- Gestational Age  LMP:           13w 4d        Date:  07/04/16                 EDD:   04/10/17  Best:          Dot Been 4d     Det. By:  LMP  (07/04/16)          EDD:   04/10/17 ---------------------------------------------------------------------- 1st Trimester Genetic Sonogram Screening  CRL:            73.1  mm    G. Age:   13w 1d                 EDD:   04/13/17 ---------------------------------------------------------------------- Cervix Uterus Adnexa  Uterus  No abnormality visualized.  Left Ovary  Not visualized. No adnexal mass visualized.  Right Ovary  Not visualized. No adnexal mass visualized.  Cul De Sac:   No free  fluid seen. ---------------------------------------------------------------------- Impression  IUP at 13+4 weeks, here for a second attempt at first  trimester screening  Normal fetal cardiac activity  normal placentation and amniotic fluid  normal fetal morphology for this EGA  Unable to obtain adequate nuchal translucency images ---------------------------------------------------------------------- Recommendations  Recommend maternal serum marker screening for this  patient. Consider repeat exam for anatomic survey in 4-6  weeks ----------------------------------------------------------------------                 Charlsie Merles, MD Electronically Signed Final Report   10/07/2016 01:04 pm ----------------------------------------------------------------------  [place] Mfm Fetal Nuchal Translucency  Result Date: 09/30/2016 ----------------------------------------------------------------------  OBSTETRICS REPORT                      (Signed Final 09/30/2016 01:15 pm) ---------------------------------------------------------------------- Patient Info  ID #:       [id number]                           D.O.B.:  [birth year] (35 yrs)  Name:       Holly Gordon [facility]            Visit Date: 09/30/2016 12:43 pm ---------------------------------------------------------------------- Performed By  Performed By:     Tommi Emery         Ref. Address:     [address]                                                             [place], [place]                                                             [address]  Attending:        Charlsie Merles MD         Location:         [facility]  Referred By:       Bing MD ---------------------------------------------------------------------- Orders   #  Description                                 Code   1  [place] MFM FETAL NUCHAL                         [phone]      TRANSLUCENCY  ----------------------------------------------------------------------   #  Ordered By  Order #        Accession #    Episode #   1  [place] BingHARLIE PICKENS          [id number]      [id number]     [id number]  ---------------------------------------------------------------------- Indications   [redacted] weeks gestation of pregnancy                Z3A.12   Encounter for nuchal translucency              Z36.82  ---------------------------------------------------------------------- OB History  Blood Type:            Height:  5'4"   Weight (lb):  171       BMI:  29.35  Gravidity:    6         Term:   2        Prem:   0        SAB:   2  TOP:          1       Ectopic:  0        Living: 1 ---------------------------------------------------------------------- Fetal Evaluation  Num Of Fetuses:     1  Preg. Location:     Intrauterine  Gest. Sac:          Intrauterine  Fetal Pole:         Visualized  Fetal Heart         162  Rate(bpm):  Cardiac Activity:   Observed ---------------------------------------------------------------------- Biometry  CRL:      59.3  mm     G. Age:  12w 2d                  EDD:    04/12/17 ---------------------------------------------------------------------- Gestational Age  LMP:           12w 4d        Date:  07/04/16                 EDD:   04/10/17  Best:          12w 4d     Det. By:  LMP  (07/04/16)          EDD:   04/10/17 ---------------------------------------------------------------------- Impression  IUP at 12+4 weeks, here for first trimester screening  Normal fetal cardiac activity  normal placentation and amniotic fluid  normal fetal morphology for this EGA  Unable to obtain adequate nuchal translucency images ---------------------------------------------------------------------- Recommendations  Attempt repeat in 1 week ----------------------------------------------------------------------                 Charlsie MerlesMark Newman, MD Electronically Signed Final Report   09/30/2016 01:15 pm ----------------------------------------------------------------------   Assessment and Plan:  Pregnancy: [id number] at 2723w4d  1. UTI in pregnancy, antepartum Finished antibiotics 3 weeks ago. TOC done today. - Culture, OB Urine  2. Dermatitis due to unknown cause Will try Kenalog, monitor response - triamcinolone ointment (KENALOG) 0.5 %; Apply 1 application topically 2 (two) times daily.  Dispense: 30 g; Refill: 6  3. Encounter for fetal anatomic survey 4. Supervision of other normal pregnancy, antepartum Anatomy scan ordered. Quad screen next visit (two failed NT scan attempts) - US MFM OB COMP + 14 WK; Future No other complaints or concerns.  Routine obstetric precautions reviewed Please refer to After Visit Summary for other counseling recommendations.  Return in about 4 weeks (around 11/04/2016) for OB Visit.   Jaynie CollinsUgonna Zonya Gudger, MD

## 2016-10-07 NOTE — Patient Instructions (Signed)

## 2016-10-16 ENCOUNTER — Encounter: Payer: Self-pay | Admitting: Obstetrics and Gynecology

## 2016-11-04 ENCOUNTER — Ambulatory Visit (INDEPENDENT_AMBULATORY_CARE_PROVIDER_SITE_OTHER): Payer: Medicaid Other | Admitting: Obstetrics and Gynecology

## 2016-11-04 VITALS — BP 130/82 | HR 85 | Wt 171.0 lb

## 2016-11-04 DIAGNOSIS — Z3482 Encounter for supervision of other normal pregnancy, second trimester: Secondary | ICD-10-CM

## 2016-11-04 DIAGNOSIS — O34219 Maternal care for unspecified type scar from previous cesarean delivery: Secondary | ICD-10-CM

## 2016-11-04 DIAGNOSIS — O2340 Unspecified infection of urinary tract in pregnancy, unspecified trimester: Secondary | ICD-10-CM

## 2016-11-04 DIAGNOSIS — O2342 Unspecified infection of urinary tract in pregnancy, second trimester: Secondary | ICD-10-CM

## 2016-11-04 DIAGNOSIS — Z3480 Encounter for supervision of other normal pregnancy, unspecified trimester: Secondary | ICD-10-CM

## 2016-11-04 NOTE — Progress Notes (Signed)
Prenatal Visit Note Date: 11/04/2016 Clinic: [facility]  Subjective:  Larene PickettStephanie M Rossy is a 36 y.o. [id number] at 3764w4d being seen today for ongoing prenatal care.  She is currently monitored for the following issues for this low-risk pregnancy and has Abdominal pain during pregnancy in first trimester; Supervision of other normal pregnancy, antepartum; History of cesarean delivery affecting pregnancy; Obesity affecting pregnancy in first trimester; Body mass index (BMI) of 25.0 to 25.9 in adult; History of substance abuse; Tobacco abuse; History of depression; Imprisonment and other incarceration; Cervical dysplasia; Marijuana abuse; and UTI in pregnancy, antepartum on her problem list.  Patient reports no complaints.   Contractions: Not present. Vag. Bleeding: None.  Movement: Present. Denies leaking of fluid.   The following portions of the patient's history were reviewed and updated as appropriate: allergies, current medications, past family history, past medical history, past social history, past surgical history and problem list. Problem list updated.  Objective:   Vitals:   11/04/16 1522  BP: 130/82  Pulse: 85  Weight: 171 lb (77.6 kg)    Fetal Status: Fetal Heart Rate (bpm): 144   Movement: Present     General:  Alert, oriented and cooperative. Patient is in no acute distress.  Skin: Skin is warm and dry. No rash noted.   Cardiovascular: Normal heart rate noted  Respiratory: Normal respiratory effort, no problems with respiration noted  Abdomen: Soft, gravid, appropriate for gestational age. Pain/Pressure: Absent     Pelvic:  Cervical exam deferred        Extremities: Normal range of motion.  Edema: None  Mental Status: Normal mood and affect. Normal behavior. Normal judgment and thought content.   Urinalysis:      Assessment and Plan:  Pregnancy: [id number] at 5664w4d  1. UTI in pregnancy, antepartum toc today - Culture, OB Urine  2. History of  cesarean delivery affecting pregnancy  3. Supervision of other normal pregnancy, antepartum Scheduled for anatomy already. - AFP TETRA  Preterm labor symptoms and general obstetric precautions including but not limited to vaginal bleeding, contractions, leaking of fluid and fetal movement were reviewed in detail with the patient. Please refer to After Visit Summary for other counseling recommendations.  Return in about 4 weeks (around 12/02/2016) for rob.   [place] BingPickens, Demareon Coldwell, MD

## 2016-11-06 LAB — AFP TETRA
DIA Mom Value: 1
DIA VALUE (EIA): 153.32 pg/mL
DSR (By Age)    1 IN: 332
DSR (Second Trimester) 1 IN: 5831
GESTATIONAL AGE AFP: 17.4 wk
MSAFP Mom: 0.96
MSAFP: 34.9 ng/mL
MSHCG MOM: 0.48
MSHCG: 12886 m[IU]/mL
Maternal Age At EDD: 34.5 yr
Osb Risk: 10000
T18 (By Age): 1:1292 {titer}
Test Results:: NEGATIVE
UE3 MOM: 0.93
Weight: 171 [lb_av]
uE3 Value: 0.99 ng/mL

## 2016-11-06 LAB — CULTURE, OB URINE

## 2016-11-06 LAB — URINE CULTURE, OB REFLEX

## 2016-11-18 ENCOUNTER — Encounter (HOSPITAL_COMMUNITY): Payer: Self-pay

## 2016-11-18 ENCOUNTER — Ambulatory Visit (HOSPITAL_COMMUNITY)
Admission: RE | Admit: 2016-11-18 | Discharge: 2016-11-18 | Disposition: A | Discharge status: Home / Self Care | Payer: Medicaid Other | Source: Ambulatory Visit | Attending: Obstetrics & Gynecology | Admitting: Obstetrics & Gynecology

## 2016-11-18 ENCOUNTER — Other Ambulatory Visit: Payer: Self-pay | Admitting: Obstetrics & Gynecology

## 2016-11-18 DIAGNOSIS — Z363 Encounter for antenatal screening for malformations: Secondary | ICD-10-CM | POA: Diagnosis not present

## 2016-11-18 DIAGNOSIS — Z3480 Encounter for supervision of other normal pregnancy, unspecified trimester: Secondary | ICD-10-CM | POA: Diagnosis present

## 2016-11-18 DIAGNOSIS — Z3A19 19 weeks gestation of pregnancy: Secondary | ICD-10-CM | POA: Insufficient documentation

## 2016-11-18 DIAGNOSIS — Z3689 Encounter for other specified antenatal screening: Secondary | ICD-10-CM

## 2016-11-18 DIAGNOSIS — O99332 Smoking (tobacco) complicating pregnancy, second trimester: Secondary | ICD-10-CM | POA: Diagnosis not present

## 2016-11-18 DIAGNOSIS — O34219 Maternal care for unspecified type scar from previous cesarean delivery: Secondary | ICD-10-CM | POA: Diagnosis not present

## 2016-11-19 ENCOUNTER — Other Ambulatory Visit (HOSPITAL_COMMUNITY): Payer: Self-pay | Admitting: *Deleted

## 2016-11-19 DIAGNOSIS — O99332 Smoking (tobacco) complicating pregnancy, second trimester: Secondary | ICD-10-CM

## 2016-12-02 ENCOUNTER — Ambulatory Visit (INDEPENDENT_AMBULATORY_CARE_PROVIDER_SITE_OTHER): Payer: Medicaid Other | Admitting: Family Medicine

## 2016-12-02 VITALS — BP 115/83 | HR 74 | Wt 173.0 lb

## 2016-12-02 DIAGNOSIS — Z3482 Encounter for supervision of other normal pregnancy, second trimester: Secondary | ICD-10-CM

## 2016-12-02 DIAGNOSIS — O34219 Maternal care for unspecified type scar from previous cesarean delivery: Secondary | ICD-10-CM

## 2016-12-02 DIAGNOSIS — Z3480 Encounter for supervision of other normal pregnancy, unspecified trimester: Secondary | ICD-10-CM

## 2016-12-02 NOTE — Patient Instructions (Signed)
 Second Trimester of Pregnancy The second trimester is from week 14 through week 27 (months 4 through 6). The second trimester is often a time when you feel your best. Your body has adjusted to being pregnant, and you begin to feel better physically. Usually, morning sickness has lessened or quit completely, you may have more energy, and you may have an increase in appetite. The second trimester is also a time when the fetus is growing rapidly. At the end of the sixth month, the fetus is about 9 inches long and weighs about 1 pounds. You will likely begin to feel the baby move (quickening) between 16 and 20 weeks of pregnancy. Body changes during your second trimester Your body continues to go through many changes during your second trimester. The changes vary from woman to woman.  Your weight will continue to increase. You will notice your lower abdomen bulging out.  You may begin to get stretch marks on your hips, abdomen, and breasts.  You may develop headaches that can be relieved by medicines. The medicines should be approved by your health care provider.  You may urinate more often because the fetus is pressing on your bladder.  You may develop or continue to have heartburn as a result of your pregnancy.  You may develop constipation because certain hormones are causing the muscles that push waste through your intestines to slow down.  You may develop hemorrhoids or swollen, bulging veins (varicose veins).  You may have back pain. This is caused by: ? Weight gain. ? Pregnancy hormones that are relaxing the joints in your pelvis. ? A shift in weight and the muscles that support your balance.  Your breasts will continue to grow and they will continue to become tender.  Your gums may bleed and may be sensitive to brushing and flossing.  Dark spots or blotches (chloasma, mask of pregnancy) may develop on your face. This will likely fade after the baby is born.  A dark line from  your belly button to the pubic area (linea nigra) may appear. This will likely fade after the baby is born.  You may have changes in your hair. These can include thickening of your hair, rapid growth, and changes in texture. Some women also have hair loss during or after pregnancy, or hair that feels dry or thin. Your hair will most likely return to normal after your baby is born.  What to expect at prenatal visits During a routine prenatal visit:  You will be weighed to make sure you and the fetus are growing normally.  Your blood pressure will be taken.  Your abdomen will be measured to track your baby's growth.  The fetal heartbeat will be listened to.  Any test results from the previous visit will be discussed.  Your health care provider may ask you:  How you are feeling.  If you are feeling the baby move.  If you have had any abnormal symptoms, such as leaking fluid, bleeding, severe headaches, or abdominal cramping.  If you are using any tobacco products, including cigarettes, chewing tobacco, and electronic cigarettes.  If you have any questions.  Other tests that may be performed during your second trimester include:  Blood tests that check for: ? Low iron levels (anemia). ? High blood sugar that affects pregnant women (gestational diabetes) between 24 and 28 weeks. ? Rh antibodies. This is to check for a protein on red blood cells (Rh factor).  Urine tests to check for infections, diabetes,   or protein in the urine.  An ultrasound to confirm the proper growth and development of the baby.  An amniocentesis to check for possible genetic problems.  Fetal screens for spina bifida and Down syndrome.  HIV (human immunodeficiency virus) testing. Routine prenatal testing includes screening for HIV, unless you choose not to have this test.  Follow these instructions at home: Medicines  Follow your health care provider's instructions regarding medicine use. Specific  medicines may be either safe or unsafe to take during pregnancy.  Take a prenatal vitamin that contains at least 600 micrograms (mcg) of folic acid.  If you develop constipation, try taking a stool softener if your health care provider approves. Eating and drinking  Eat a balanced diet that includes fresh fruits and vegetables, whole grains, good sources of protein such as meat, eggs, or tofu, and low-fat dairy. Your health care provider will help you determine the amount of weight gain that is right for you.  Avoid raw meat and uncooked cheese. These carry germs that can cause birth defects in the baby.  If you have low calcium intake from food, talk to your health care provider about whether you should take a daily calcium supplement.  Limit foods that are high in fat and processed sugars, such as fried and sweet foods.  To prevent constipation: ? Drink enough fluid to keep your urine clear or pale yellow. ? Eat foods that are high in fiber, such as fresh fruits and vegetables, whole grains, and beans. Activity  Exercise only as directed by your health care provider. Most women can continue their usual exercise routine during pregnancy. Try to exercise for 30 minutes at least 5 days a week. Stop exercising if you experience uterine contractions.  Avoid heavy lifting, wear low heel shoes, and practice good posture.  A sexual relationship may be continued unless your health care provider directs you otherwise. Relieving pain and discomfort  Wear a good support bra to prevent discomfort from breast tenderness.  Take warm sitz baths to soothe any pain or discomfort caused by hemorrhoids. Use hemorrhoid cream if your health care provider approves.  Rest with your legs elevated if you have leg cramps or low back pain.  If you develop varicose veins, wear support hose. Elevate your feet for 15 minutes, 3-4 times a day. Limit salt in your diet. Prenatal Care  Write down your questions.  Take them to your prenatal visits.  Keep all your prenatal visits as told by your health care provider. This is important. Safety  Wear your seat belt at all times when driving.  Make a list of emergency phone numbers, including numbers for family, friends, the hospital, and police and fire departments. General instructions  Ask your health care provider for a referral to a local prenatal education class. Begin classes no later than the beginning of month 6 of your pregnancy.  Ask for help if you have counseling or nutritional needs during pregnancy. Your health care provider can offer advice or refer you to specialists for help with various needs.  Do not use hot tubs, steam rooms, or saunas.  Do not douche or use tampons or scented sanitary pads.  Do not cross your legs for long periods of time.  Avoid cat litter boxes and soil used by cats. These carry germs that can cause birth defects in the baby and possibly loss of the fetus by miscarriage or stillbirth.  Avoid all smoking, herbs, alcohol, and unprescribed drugs. Chemicals in these products   can affect the formation and growth of the baby.  Do not use any products that contain nicotine or tobacco, such as cigarettes and e-cigarettes. If you need help quitting, ask your health care provider.  Visit your dentist if you have not gone yet during your pregnancy. Use a soft toothbrush to brush your teeth and be gentle when you floss. Contact a health care provider if:  You have dizziness.  You have mild pelvic cramps, pelvic pressure, or nagging pain in the abdominal area.  You have persistent nausea, vomiting, or diarrhea.  You have a bad smelling vaginal discharge.  You have pain when you urinate. Get help right away if:  You have a fever.  You are leaking fluid from your vagina.  You have spotting or bleeding from your vagina.  You have severe abdominal cramping or pain.  You have rapid weight gain or weight  loss.  You have shortness of breath with chest pain.  You notice sudden or extreme swelling of your face, hands, ankles, feet, or legs.  You have not felt your baby move in over an hour.  You have severe headaches that do not go away when you take medicine.  You have vision changes. Summary  The second trimester is from week 14 through week 27 (months 4 through 6). It is also a time when the fetus is growing rapidly.  Your body goes through many changes during pregnancy. The changes vary from woman to woman.  Avoid all smoking, herbs, alcohol, and unprescribed drugs. These chemicals affect the formation and growth your baby.  Do not use any tobacco products, such as cigarettes, chewing tobacco, and e-cigarettes. If you need help quitting, ask your health care provider.  Contact your health care provider if you have any questions. Keep all prenatal visits as told by your health care provider. This is important. This information is not intended to replace advice given to you by your health care provider. Make sure you discuss any questions you have with your health care provider. Document Released: 02/11/2001 Document Revised: 07/26/2015 Document Reviewed: 04/20/2012 [employer] Interactive Patient Education  2017 [employer]   Breastfeeding Deciding to breastfeed is one of the best choices you can make for you and your baby. A change in hormones during pregnancy causes your breast tissue to grow and increases the number and size of your milk ducts. These hormones also allow proteins, sugars, and fats from your blood supply to make breast milk in your milk-producing glands. Hormones prevent breast milk from being released before your baby is born as well as prompt milk flow after birth. Once breastfeeding has begun, thoughts of your baby, as well as his or her sucking or crying, can stimulate the release of milk from your milk-producing glands. Benefits of breastfeeding For Your  Baby  Your first milk (colostrum) helps your baby's digestive system function better.  There are antibodies in your milk that help your baby fight off infections.  Your baby has a lower incidence of asthma, allergies, and sudden infant death syndrome.  The nutrients in breast milk are better for your baby than infant formulas and are designed uniquely for your baby's needs.  Breast milk improves your baby's brain development.  Your baby is less likely to develop other conditions, such as childhood obesity, asthma, or type 2 diabetes mellitus.  For You  Breastfeeding helps to create a very special bond between you and your baby.  Breastfeeding is convenient. Breast milk is always available at   the correct temperature and costs nothing.  Breastfeeding helps to burn calories and helps you lose the weight gained during pregnancy.  Breastfeeding makes your uterus contract to its prepregnancy size faster and slows bleeding (lochia) after you give birth.  Breastfeeding helps to lower your risk of developing type 2 diabetes mellitus, osteoporosis, and breast or ovarian cancer later in life.  Signs that your baby is hungry Early Signs of Hunger  Increased alertness or activity.  Stretching.  Movement of the head from side to side.  Movement of the head and opening of the mouth when the corner of the mouth or cheek is stroked (rooting).  Increased sucking sounds, smacking lips, cooing, sighing, or squeaking.  Hand-to-mouth movements.  Increased sucking of fingers or hands.  Late Signs of Hunger  Fussing.  Intermittent crying.  Extreme Signs of Hunger Signs of extreme hunger will require calming and consoling before your baby will be able to breastfeed successfully. Do not wait for the following signs of extreme hunger to occur before you initiate breastfeeding:  Restlessness.  A loud, strong cry.  Screaming.  Breastfeeding basics Breastfeeding Initiation  Find a  comfortable place to sit or lie down, with your neck and back well supported.  Place a pillow or rolled up blanket under your baby to bring him or her to the level of your breast (if you are seated). Nursing pillows are specially designed to help support your arms and your baby while you breastfeed.  Make sure that your baby's abdomen is facing your abdomen.  Gently massage your breast. With your fingertips, massage from your chest wall toward your nipple in a circular motion. This encourages milk flow. You may need to continue this action during the feeding if your milk flows slowly.  Support your breast with 4 fingers underneath and your thumb above your nipple. Make sure your fingers are well away from your nipple and your baby's mouth.  Stroke your baby's lips gently with your finger or nipple.  When your baby's mouth is open wide enough, quickly bring your baby to your breast, placing your entire nipple and as much of the colored area around your nipple (areola) as possible into your baby's mouth. ? More areola should be visible above your baby's upper lip than below the lower lip. ? Your baby's tongue should be between his or her lower gum and your breast.  Ensure that your baby's mouth is correctly positioned around your nipple (latched). Your baby's lips should create a seal on your breast and be turned out (everted).  It is common for your baby to suck about 2-3 minutes in order to start the flow of breast milk.  Latching Teaching your baby how to latch on to your breast properly is very important. An improper latch can cause nipple pain and decreased milk supply for you and poor weight gain in your baby. Also, if your baby is not latched onto your nipple properly, he or she may swallow some air during feeding. This can make your baby fussy. Burping your baby when you switch breasts during the feeding can help to get rid of the air. However, teaching your baby to latch on properly is  still the best way to prevent fussiness from swallowing air while breastfeeding. Signs that your baby has successfully latched on to your nipple:  Silent tugging or silent sucking, without causing you pain.  Swallowing heard between every 3-4 sucks.  Muscle movement above and in front of his or her   ears while sucking.  Signs that your baby has not successfully latched on to nipple:  Sucking sounds or smacking sounds from your baby while breastfeeding.  Nipple pain.  If you think your baby has not latched on correctly, slip your finger into the corner of your baby's mouth to break the suction and place it between your baby's gums. Attempt breastfeeding initiation again. Signs of Successful Breastfeeding Signs from your baby:  A gradual decrease in the number of sucks or complete cessation of sucking.  Falling asleep.  Relaxation of his or her body.  Retention of a small amount of milk in his or her mouth.  Letting go of your breast by himself or herself.  Signs from you:  Breasts that have increased in firmness, weight, and size 1-3 hours after feeding.  Breasts that are softer immediately after breastfeeding.  Increased milk volume, as well as a change in milk consistency and color by the fifth day of breastfeeding.  Nipples that are not sore, cracked, or bleeding.  Signs That Your Baby is Getting Enough Milk  Wetting at least 1-2 diapers during the first 24 hours after birth.  Wetting at least 5-6 diapers every 24 hours for the first week after birth. The urine should be clear or pale yellow by 5 days after birth.  Wetting 6-8 diapers every 24 hours as your baby continues to grow and develop.  At least 3 stools in a 24-hour period by age 5 days. The stool should be soft and yellow.  At least 3 stools in a 24-hour period by age 7 days. The stool should be seedy and yellow.  No loss of weight greater than 10% of birth weight during the first 3 days of age.  Average  weight gain of 4-7 ounces (113-198 g) per week after age 4 days.  Consistent daily weight gain by age 5 days, without weight loss after the age of 2 weeks.  After a feeding, your baby may spit up a small amount. This is common. Breastfeeding frequency and duration Frequent feeding will help you make more milk and can prevent sore nipples and breast engorgement. Breastfeed when you feel the need to reduce the fullness of your breasts or when your baby shows signs of hunger. This is called "breastfeeding on demand." Avoid introducing a pacifier to your baby while you are working to establish breastfeeding (the first 4-6 weeks after your baby is born). After this time you may choose to use a pacifier. Research has shown that pacifier use during the first year of a baby's life decreases the risk of sudden infant death syndrome (SIDS). Allow your baby to feed on each breast as long as he or she wants. Breastfeed until your baby is finished feeding. When your baby unlatches or falls asleep while feeding from the first breast, offer the second breast. Because newborns are often sleepy in the first few weeks of life, you may need to awaken your baby to get him or her to feed. Breastfeeding times will vary from baby to baby. However, the following rules can serve as a guide to help you ensure that your baby is properly fed:  Newborns (babies 4 weeks of age or younger) may breastfeed every 1-3 hours.  Newborns should not go longer than 3 hours during the day or 5 hours during the night without breastfeeding.  You should breastfeed your baby a minimum of 8 times in a 24-hour period until you begin to introduce solid foods to your   baby at around 6 months of age.  Breast milk pumping Pumping and storing breast milk allows you to ensure that your baby is exclusively fed your breast milk, even at times when you are unable to breastfeed. This is especially important if you are going back to work while you are still  breastfeeding or when you are not able to be present during feedings. Your lactation consultant can give you guidelines on how long it is safe to store breast milk. A breast pump is a machine that allows you to pump milk from your breast into a sterile bottle. The pumped breast milk can then be stored in a refrigerator or freezer. Some breast pumps are operated by hand, while others use electricity. Ask your lactation consultant which type will work best for you. Breast pumps can be purchased, but some hospitals and breastfeeding support groups lease breast pumps on a monthly basis. A lactation consultant can teach you how to hand express breast milk, if you prefer not to use a pump. Caring for your breasts while you breastfeed Nipples can become dry, cracked, and sore while breastfeeding. The following recommendations can help keep your breasts moisturized and healthy:  Avoid using soap on your nipples.  Wear a supportive bra. Although not required, special nursing bras and tank tops are designed to allow access to your breasts for breastfeeding without taking off your entire bra or top. Avoid wearing underwire-style bras or extremely tight bras.  Air dry your nipples for 3-4minutes after each feeding.  Use only cotton bra pads to absorb leaked breast milk. Leaking of breast milk between feedings is normal.  Use lanolin on your nipples after breastfeeding. Lanolin helps to maintain your skin's normal moisture barrier. If you use pure lanolin, you do not need to wash it off before feeding your baby again. Pure lanolin is not toxic to your baby. You may also hand express a few drops of breast milk and gently massage that milk into your nipples and allow the milk to air dry.  In the first few weeks after giving birth, some women experience extremely full breasts (engorgement). Engorgement can make your breasts feel heavy, warm, and tender to the touch. Engorgement peaks within 3-5 days after you give  birth. The following recommendations can help ease engorgement:  Completely empty your breasts while breastfeeding or pumping. You may want to start by applying warm, moist heat (in the shower or with warm water-soaked hand towels) just before feeding or pumping. This increases circulation and helps the milk flow. If your baby does not completely empty your breasts while breastfeeding, pump any extra milk after he or she is finished.  Wear a snug bra (nursing or regular) or tank top for 1-2 days to signal your body to slightly decrease milk production.  Apply ice packs to your breasts, unless this is too uncomfortable for you.  Make sure that your baby is latched on and positioned properly while breastfeeding.  If engorgement persists after 48 hours of following these recommendations, contact your health care provider or a lactation consultant. Overall health care recommendations while breastfeeding  Eat healthy foods. Alternate between meals and snacks, eating 3 of each per day. Because what you eat affects your breast milk, some of the foods may make your baby more irritable than usual. Avoid eating these foods if you are sure that they are negatively affecting your baby.  Drink milk, fruit juice, and water to satisfy your thirst (about 10 glasses a day).    Rest often, relax, and continue to take your prenatal vitamins to prevent fatigue, stress, and anemia.  Continue breast self-awareness checks.  Avoid chewing and smoking tobacco. Chemicals from cigarettes that pass into breast milk and exposure to secondhand smoke may harm your baby.  Avoid alcohol and drug use, including marijuana. Some medicines that may be harmful to your baby can pass through breast milk. It is important to ask your health care provider before taking any medicine, including all over-the-counter and prescription medicine as well as vitamin and herbal supplements. It is possible to become pregnant while breastfeeding.  If birth control is desired, ask your health care provider about options that will be safe for your baby. Contact a health care provider if:  You feel like you want to stop breastfeeding or have become frustrated with breastfeeding.  You have painful breasts or nipples.  Your nipples are cracked or bleeding.  Your breasts are red, tender, or warm.  You have a swollen area on either breast.  You have a fever or chills.  You have nausea or vomiting.  You have drainage other than breast milk from your nipples.  Your breasts do not become full before feedings by the fifth day after you give birth.  You feel sad and depressed.  Your baby is too sleepy to eat well.  Your baby is having trouble sleeping.  Your baby is wetting less than 3 diapers in a 24-hour period.  Your baby has less than 3 stools in a 24-hour period.  Your baby's skin or the white part of his or her eyes becomes yellow.  Your baby is not gaining weight by 5 days of age. Get help right away if:  Your baby is overly tired (lethargic) and does not want to wake up and feed.  Your baby develops an unexplained fever. This information is not intended to replace advice given to you by your health care provider. Make sure you discuss any questions you have with your health care provider. Document Released: 02/17/2005 Document Revised: 08/01/2015 Document Reviewed: 08/11/2012 [employer] Interactive Patient Education  2017 [employer]  

## 2016-12-03 NOTE — Progress Notes (Signed)
   PRENATAL VISIT NOTE  Subjective:  Holly Gordon is a 36 y.o. [id number] at [redacted]w[redacted]d being seen today for ongoing prenatal care.  She is currently monitored for the following issues for this low-risk pregnancy and has Abdominal pain during pregnancy in first trimester; Supervision of other normal pregnancy, antepartum; History of cesarean delivery affecting pregnancy; Obesity affecting pregnancy in first trimester; Body mass index (BMI) of 25.0 to 25.9 in adult; History of substance abuse; Tobacco abuse; History of depression; Imprisonment and other incarceration; Cervical dysplasia; Marijuana abuse; and UTI in pregnancy, antepartum on her problem list.  Patient reports no complaints.  Contractions: Not present.  .  Movement: Present. Denies leaking of fluid.   The following portions of the patient's history were reviewed and updated as appropriate: allergies, current medications, past family history, past medical history, past social history, past surgical history and problem list. Problem list updated.  Objective:   Vitals:   12/02/16 1425  BP: 115/83  Pulse: 74  Weight: 173 lb (78.5 kg)    Fetal Status: Fetal Heart Rate (bpm): 147   Movement: Present     General:  Alert, oriented and cooperative. Patient is in no acute distress.  Skin: Skin is warm and dry. No rash noted.   Cardiovascular: Normal heart rate noted  Respiratory: Normal respiratory effort, no problems with respiration noted  Abdomen: Soft, gravid, appropriate for gestational age.  Pain/Pressure: Absent     Pelvic: Cervical exam deferred        Extremities: Normal range of motion.  Edema: None  Mental Status:  Normal mood and affect. Normal behavior. Normal judgment and thought content.   Assessment and Plan:  Pregnancy: [id number] at [redacted]w[redacted]d  1. History of cesarean delivery affecting pregnancy Desires RCS  2. Supervision of other normal pregnancy, antepartum Continue routine prenatal care. Has f/u to complete  anatomy u/s   Preterm labor symptoms and general obstetric precautions including but not limited to vaginal bleeding, contractions, leaking of fluid and fetal movement were reviewed in detail with the patient. Please refer to After Visit Summary for other counseling recommendations.  Return in 4 weeks (on 12/30/2016).   Reva Bores, MD

## 2016-12-30 ENCOUNTER — Ambulatory Visit (HOSPITAL_COMMUNITY)
Admission: RE | Admit: 2016-12-30 | Discharge: 2016-12-30 | Disposition: A | Discharge status: Home / Self Care | Payer: Medicaid Other | Source: Ambulatory Visit | Attending: Obstetrics & Gynecology | Admitting: Obstetrics & Gynecology

## 2016-12-30 ENCOUNTER — Ambulatory Visit (INDEPENDENT_AMBULATORY_CARE_PROVIDER_SITE_OTHER): Payer: Medicaid Other | Admitting: Family Medicine

## 2016-12-30 ENCOUNTER — Other Ambulatory Visit: Payer: Self-pay | Admitting: Certified Nurse Midwife

## 2016-12-30 ENCOUNTER — Encounter (HOSPITAL_COMMUNITY): Payer: Self-pay

## 2016-12-30 VITALS — BP 121/86 | HR 79 | Wt 181.0 lb

## 2016-12-30 DIAGNOSIS — Z3A25 25 weeks gestation of pregnancy: Secondary | ICD-10-CM | POA: Insufficient documentation

## 2016-12-30 DIAGNOSIS — Z3482 Encounter for supervision of other normal pregnancy, second trimester: Secondary | ICD-10-CM

## 2016-12-30 DIAGNOSIS — Z362 Encounter for other antenatal screening follow-up: Secondary | ICD-10-CM | POA: Diagnosis not present

## 2016-12-30 DIAGNOSIS — O34219 Maternal care for unspecified type scar from previous cesarean delivery: Secondary | ICD-10-CM | POA: Diagnosis not present

## 2016-12-30 DIAGNOSIS — Z3480 Encounter for supervision of other normal pregnancy, unspecified trimester: Secondary | ICD-10-CM

## 2016-12-30 DIAGNOSIS — O99332 Smoking (tobacco) complicating pregnancy, second trimester: Secondary | ICD-10-CM | POA: Insufficient documentation

## 2016-12-30 NOTE — Patient Instructions (Signed)
Breastfeeding Deciding to breastfeed is one of the best choices you can make for you and your baby. A change in hormones during pregnancy causes your breast tissue to grow and increases the number and size of your milk ducts. These hormones also allow proteins, sugars, and fats from your blood supply to make breast milk in your milk-producing glands. Hormones prevent breast milk from being released before your baby is born as well as prompt milk flow after birth. Once breastfeeding has begun, thoughts of your baby, as well as his or her sucking or crying, can stimulate the release of milk from your milk-producing glands. Benefits of breastfeeding For Your Baby  Your first milk (colostrum) helps your baby's digestive system function better.  There are antibodies in your milk that help your baby fight off infections.  Your baby has a lower incidence of asthma, allergies, and sudden infant death syndrome.  The nutrients in breast milk are better for your baby than infant formulas and are designed uniquely for your baby's needs.  Breast milk improves your baby's brain development.  Your baby is less likely to develop other conditions, such as childhood obesity, asthma, or type 2 diabetes mellitus.  For You  Breastfeeding helps to create a very special bond between you and your baby.  Breastfeeding is convenient. Breast milk is always available at the correct temperature and costs nothing.  Breastfeeding helps to burn calories and helps you lose the weight gained during pregnancy.  Breastfeeding makes your uterus contract to its prepregnancy size faster and slows bleeding (lochia) after you give birth.  Breastfeeding helps to lower your risk of developing type 2 diabetes mellitus, osteoporosis, and breast or ovarian cancer later in life.  Signs that your baby is hungry Early Signs of Hunger  Increased alertness or activity.  Stretching.  Movement of the head from side to  side.  Movement of the head and opening of the mouth when the corner of the mouth or cheek is stroked (rooting).  Increased sucking sounds, smacking lips, cooing, sighing, or squeaking.  Hand-to-mouth movements.  Increased sucking of fingers or hands.  Late Signs of Hunger  Fussing.  Intermittent crying.  Extreme Signs of Hunger Signs of extreme hunger will require calming and consoling before your baby will be able to breastfeed successfully. Do not wait for the following signs of extreme hunger to occur before you initiate breastfeeding:  Restlessness.  A loud, strong cry.  Screaming.  Breastfeeding basics Breastfeeding Initiation  Find a comfortable place to sit or lie down, with your neck and back well supported.  Place a pillow or rolled up blanket under your baby to bring him or her to the level of your breast (if you are seated). Nursing pillows are specially designed to help support your arms and your baby while you breastfeed.  Make sure that your baby's abdomen is facing your abdomen.  Gently massage your breast. With your fingertips, massage from your chest wall toward your nipple in a circular motion. This encourages milk flow. You may need to continue this action during the feeding if your milk flows slowly.  Support your breast with 4 fingers underneath and your thumb above your nipple. Make sure your fingers are well away from your nipple and your baby's mouth.  Stroke your baby's lips gently with your finger or nipple.  When your baby's mouth is open wide enough, quickly bring your baby to your breast, placing your entire nipple and as much of the colored area   around your nipple (areola) as possible into your baby's mouth. ? More areola should be visible above your baby's upper lip than below the lower lip. ? Your baby's tongue should be between his or her lower gum and your breast.  Ensure that your baby's mouth is correctly positioned around your nipple  (latched). Your baby's lips should create a seal on your breast and be turned out (everted).  It is common for your baby to suck about 2-3 minutes in order to start the flow of breast milk.  Latching Teaching your baby how to latch on to your breast properly is very important. An improper latch can cause nipple pain and decreased milk supply for you and poor weight gain in your baby. Also, if your baby is not latched onto your nipple properly, he or she may swallow some air during feeding. This can make your baby fussy. Burping your baby when you switch breasts during the feeding can help to get rid of the air. However, teaching your baby to latch on properly is still the best way to prevent fussiness from swallowing air while breastfeeding. Signs that your baby has successfully latched on to your nipple:  Silent tugging or silent sucking, without causing you pain.  Swallowing heard between every 3-4 sucks.  Muscle movement above and in front of his or her ears while sucking.  Signs that your baby has not successfully latched on to nipple:  Sucking sounds or smacking sounds from your baby while breastfeeding.  Nipple pain.  If you think your baby has not latched on correctly, slip your finger into the corner of your baby's mouth to break the suction and place it between your baby's gums. Attempt breastfeeding initiation again. Signs of Successful Breastfeeding Signs from your baby:  A gradual decrease in the number of sucks or complete cessation of sucking.  Falling asleep.  Relaxation of his or her body.  Retention of a small amount of milk in his or her mouth.  Letting go of your breast by himself or herself.  Signs from you:  Breasts that have increased in firmness, weight, and size 1-3 hours after feeding.  Breasts that are softer immediately after breastfeeding.  Increased milk volume, as well as a change in milk consistency and color by the fifth day of  breastfeeding.  Nipples that are not sore, cracked, or bleeding.  Signs That Your Baby is Getting Enough Milk  Wetting at least 1-2 diapers during the first 24 hours after birth.  Wetting at least 5-6 diapers every 24 hours for the first week after birth. The urine should be clear or pale yellow by 5 days after birth.  Wetting 6-8 diapers every 24 hours as your baby continues to grow and develop.  At least 3 stools in a 24-hour period by age 5 days. The stool should be soft and yellow.  At least 3 stools in a 24-hour period by age 7 days. The stool should be seedy and yellow.  No loss of weight greater than 10% of birth weight during the first 3 days of age.  Average weight gain of 4-7 ounces (113-198 g) per week after age 4 days.  Consistent daily weight gain by age 5 days, without weight loss after the age of 2 weeks.  After a feeding, your baby may spit up a small amount. This is common. Breastfeeding frequency and duration Frequent feeding will help you make more milk and can prevent sore nipples and breast engorgement. Breastfeed when   you feel the need to reduce the fullness of your breasts or when your baby shows signs of hunger. This is called "breastfeeding on demand." Avoid introducing a pacifier to your baby while you are working to establish breastfeeding (the first 4-6 weeks after your baby is born). After this time you may choose to use a pacifier. Research has shown that pacifier use during the first year of a baby's life decreases the risk of sudden infant death syndrome (SIDS). Allow your baby to feed on each breast as long as he or she wants. Breastfeed until your baby is finished feeding. When your baby unlatches or falls asleep while feeding from the first breast, offer the second breast. Because newborns are often sleepy in the first few weeks of life, you may need to awaken your baby to get him or her to feed. Breastfeeding times will vary from baby to baby. However,  the following rules can serve as a guide to help you ensure that your baby is properly fed:  Newborns (babies 4 weeks of age or younger) may breastfeed every 1-3 hours.  Newborns should not go longer than 3 hours during the day or 5 hours during the night without breastfeeding.  You should breastfeed your baby a minimum of 8 times in a 24-hour period until you begin to introduce solid foods to your baby at around 6 months of age.  Breast milk pumping Pumping and storing breast milk allows you to ensure that your baby is exclusively fed your breast milk, even at times when you are unable to breastfeed. This is especially important if you are going back to work while you are still breastfeeding or when you are not able to be present during feedings. Your lactation consultant can give you guidelines on how long it is safe to store breast milk. A breast pump is a machine that allows you to pump milk from your breast into a sterile bottle. The pumped breast milk can then be stored in a refrigerator or freezer. Some breast pumps are operated by hand, while others use electricity. Ask your lactation consultant which type will work best for you. Breast pumps can be purchased, but some hospitals and breastfeeding support groups lease breast pumps on a monthly basis. A lactation consultant can teach you how to hand express breast milk, if you prefer not to use a pump. Caring for your breasts while you breastfeed Nipples can become dry, cracked, and sore while breastfeeding. The following recommendations can help keep your breasts moisturized and healthy:  Avoid using soap on your nipples.  Wear a supportive bra. Although not required, special nursing bras and tank tops are designed to allow access to your breasts for breastfeeding without taking off your entire bra or top. Avoid wearing underwire-style bras or extremely tight bras.  Air dry your nipples for 3-4minutes after each feeding.  Use only cotton  bra pads to absorb leaked breast milk. Leaking of breast milk between feedings is normal.  Use lanolin on your nipples after breastfeeding. Lanolin helps to maintain your skin's normal moisture barrier. If you use pure lanolin, you do not need to wash it off before feeding your baby again. Pure lanolin is not toxic to your baby. You may also hand express a few drops of breast milk and gently massage that milk into your nipples and allow the milk to air dry.  In the first few weeks after giving birth, some women experience extremely full breasts (engorgement). Engorgement can make your   breasts feel heavy, warm, and tender to the touch. Engorgement peaks within 3-5 days after you give birth. The following recommendations can help ease engorgement:  Completely empty your breasts while breastfeeding or pumping. You may want to start by applying warm, moist heat (in the shower or with warm water-soaked hand towels) just before feeding or pumping. This increases circulation and helps the milk flow. If your baby does not completely empty your breasts while breastfeeding, pump any extra milk after he or she is finished.  Wear a snug bra (nursing or regular) or tank top for 1-2 days to signal your body to slightly decrease milk production.  Apply ice packs to your breasts, unless this is too uncomfortable for you.  Make sure that your baby is latched on and positioned properly while breastfeeding.  If engorgement persists after 48 hours of following these recommendations, contact your health care provider or a lactation consultant. Overall health care recommendations while breastfeeding  Eat healthy foods. Alternate between meals and snacks, eating 3 of each per day. Because what you eat affects your breast milk, some of the foods may make your baby more irritable than usual. Avoid eating these foods if you are sure that they are negatively affecting your baby.  Drink milk, fruit juice, and water to  satisfy your thirst (about 10 glasses a day).  Rest often, relax, and continue to take your prenatal vitamins to prevent fatigue, stress, and anemia.  Continue breast self-awareness checks.  Avoid chewing and smoking tobacco. Chemicals from cigarettes that pass into breast milk and exposure to secondhand smoke may harm your baby.  Avoid alcohol and drug use, including marijuana. Some medicines that may be harmful to your baby can pass through breast milk. It is important to ask your health care provider before taking any medicine, including all over-the-counter and prescription medicine as well as vitamin and herbal supplements. It is possible to become pregnant while breastfeeding. If birth control is desired, ask your health care provider about options that will be safe for your baby. Contact a health care provider if:  You feel like you want to stop breastfeeding or have become frustrated with breastfeeding.  You have painful breasts or nipples.  Your nipples are cracked or bleeding.  Your breasts are red, tender, or warm.  You have a swollen area on either breast.  You have a fever or chills.  You have nausea or vomiting.  You have drainage other than breast milk from your nipples.  Your breasts do not become full before feedings by the fifth day after you give birth.  You feel sad and depressed.  Your baby is too sleepy to eat well.  Your baby is having trouble sleeping.  Your baby is wetting less than 3 diapers in a 24-hour period.  Your baby has less than 3 stools in a 24-hour period.  Your baby's skin or the white part of his or her eyes becomes yellow.  Your baby is not gaining weight by 5 days of age. Get help right away if:  Your baby is overly tired (lethargic) and does not want to wake up and feed.  Your baby develops an unexplained fever. This information is not intended to replace advice given to you by your health care provider. Make sure you discuss  any questions you have with your health care provider. Document Released: 02/17/2005 Document Revised: 08/01/2015 Document Reviewed: 08/11/2012 Elsevier Interactive Patient Education  2017 [employer]  

## 2016-12-31 NOTE — Progress Notes (Signed)
   PRENATAL VISIT NOTE  Subjective:  Holly Gordon is a [age] y.o. [phone] at 6856w5d being seen today for ongoing prenatal care.  She is currently monitored for the following issues for this high-risk pregnancy and has Abdominal pain during pregnancy in first trimester; Supervision of other normal pregnancy, antepartum; History of cesarean delivery affecting pregnancy; Obesity affecting pregnancy in first trimester; Body mass index (BMI) of 25.0 to 25.9 in adult; History of substance abuse; Tobacco abuse; History of depression; Imprisonment and other incarceration; Cervical dysplasia; Marijuana abuse; and UTI in pregnancy, antepartum on her problem list.  Patient reports no complaints.   .  .  Movement: Present. Denies leaking of fluid.   The following portions of the patient's history were reviewed and updated as appropriate: allergies, current medications, past family history, past medical history, past social history, past surgical history and problem list. Problem list updated.  Objective:   Vitals:   12/30/16 1103  BP: 121/86  Pulse: 79  Weight: 181 lb (82.1 kg)    Fetal Status: Fetal Heart Rate (bpm): 136   Movement: Present     General:  Alert, oriented and cooperative. Patient is in no acute distress.  Skin: Skin is warm and dry. No rash noted.   Cardiovascular: Normal heart rate noted  Respiratory: Normal respiratory effort, no problems with respiration noted  Abdomen: Soft, gravid, appropriate for gestational age.  Pain/Pressure: Absent     Pelvic: Cervical exam deferred        Extremities: Normal range of motion.  Edema: None  Mental Status:  Normal mood and affect. Normal behavior. Normal judgment and thought content.   Assessment and Plan:  Pregnancy: [id number] at 6856w5d  1. Supervision of other normal pregnancy, antepartum Declines flu, TDaP 28 wk labs next visit  2. History of cesarean delivery affecting pregnancy Desires ERLTCS  Preterm labor symptoms and  general obstetric precautions including but not limited to vaginal bleeding, contractions, leaking of fluid and fetal movement were reviewed in detail with the patient. Please refer to After Visit Summary for other counseling recommendations.  Return in 3 weeks (on 01/20/2017) for 28 wk labs, ob visit.   Reva Boresanya S Marygrace Sandoval, MD

## 2017-01-17 ENCOUNTER — Other Ambulatory Visit: Payer: Self-pay

## 2017-01-17 ENCOUNTER — Encounter: Payer: Self-pay | Admitting: Emergency Medicine

## 2017-01-17 ENCOUNTER — Emergency Department
Admission: EM | Admit: 2017-01-17 | Discharge: 2017-01-17 | Discharge status: Against Medical Advice | Payer: Medicaid Other | Attending: Emergency Medicine | Admitting: Emergency Medicine

## 2017-01-17 DIAGNOSIS — Z79899 Other long term (current) drug therapy: Secondary | ICD-10-CM | POA: Diagnosis not present

## 2017-01-17 DIAGNOSIS — F17210 Nicotine dependence, cigarettes, uncomplicated: Secondary | ICD-10-CM | POA: Diagnosis not present

## 2017-01-17 DIAGNOSIS — R42 Dizziness and giddiness: Secondary | ICD-10-CM | POA: Insufficient documentation

## 2017-01-17 DIAGNOSIS — Z3A28 28 weeks gestation of pregnancy: Secondary | ICD-10-CM | POA: Diagnosis not present

## 2017-01-17 DIAGNOSIS — Z5329 Procedure and treatment not carried out because of patient's decision for other reasons: Secondary | ICD-10-CM | POA: Diagnosis not present

## 2017-01-17 DIAGNOSIS — O9989 Other specified diseases and conditions complicating pregnancy, childbirth and the puerperium: Secondary | ICD-10-CM | POA: Insufficient documentation

## 2017-01-17 DIAGNOSIS — J069 Acute upper respiratory infection, unspecified: Secondary | ICD-10-CM | POA: Diagnosis not present

## 2017-01-17 DIAGNOSIS — R05 Cough: Secondary | ICD-10-CM | POA: Diagnosis present

## 2017-01-17 LAB — POCT RAPID STREP A: STREPTOCOCCUS, GROUP A SCREEN (DIRECT): NEGATIVE

## 2017-01-17 NOTE — ED Triage Notes (Addendum)
Cough body aches and chills x 3 days. Patient is [redacted] weeks pregnant with no bleeding, fluid or contractions.

## 2017-01-17 NOTE — ED Provider Notes (Signed)
[facility] EMERGENCY DEPARTMENT Provider Note   CSN: [id number] Arrival date & time: 01/17/17  1030     History   Chief Complaint Chief Complaint  Patient presents with  . Cough    HPI Holly Gordon is a 36 y.o. female presents to the emergency department for evaluation of sneezing, sore throat, nonproductive cough, nasal congestion times 3 days.  She is [redacted] weeks pregnant.  Denies any abdominal, pelvic pain, vaginal bleeding or discharge.  She has had normal fetal movement.  No complications throughout the pregnancy.  Patient states she has been taking Tylenol Cold as well as Benadryl without relief of her nasal congestion and cough.  At nighttime she has a hard time breathing through her nose.  She denies any fevers.  Patient denies any chest pain or shortness of breath.  No nausea vomiting or diarrhea.  No skin rashes.  She states she has had some episodes of dizziness after standing.  Denies the room spinning, tinnitus.  Dizziness is improved with sitting.  HPI  Past Medical History:  Diagnosis Date  . Anemia   . Anxiety   . Bacterial vaginitis 08/26/2016  . Depression   . History of abnormal cervical Pap smear     Patient Active Problem List   Diagnosis Date Noted  . Marijuana abuse 09/18/2016  . UTI in pregnancy, antepartum 09/18/2016  . Cervical dysplasia 09/15/2016  . Supervision of other normal pregnancy, antepartum 09/09/2016  . History of cesarean delivery affecting pregnancy 09/09/2016  . Obesity affecting pregnancy in first trimester 09/09/2016  . Body mass index (BMI) of 25.0 to 25.9 in adult 09/09/2016  . History of substance abuse 09/09/2016  . Tobacco abuse 09/09/2016  . History of depression 09/09/2016  . Imprisonment and other incarceration 09/09/2016  . Abdominal pain during pregnancy in first trimester 08/26/2016    Past Surgical History:  Procedure Laterality Date  . CESAREAN SECTION  2008   x1  . COLPOSCOPY     abnormal pap  . INDUCED ABORTION    . TONSILLECTOMY    . WISDOM TOOTH EXTRACTION     x4    OB History    Gravida Para Term Preterm AB Living   6 2 2  0 3 1   SAB TAB Ectopic Multiple Live Births   2 1 0 0 2       Home Medications    Prior to Admission medications   Medication Sig Start Date End Date Taking? Authorizing Provider  Prenatal Vit-Fe Fumarate-FA (PRENATAL VITAMIN PO) Take by mouth.    [provider]  triamcinolone ointment (KENALOG) 0.5 % Apply 1 application topically 2 (two) times daily. Patient not taking: Reported on 11/04/2016 10/07/16   Tereso NewcomerAnyanwu, Ugonna A, MD    Family History Family History  Problem Relation Age of Onset  . Hypertension Father   . Diabetes Father   . Kidney disease Maternal Grandmother   . Heart disease Maternal Grandmother   . Alzheimer's disease Maternal Grandfather     Social History Social History   Tobacco Use  . Smoking status: Current Every Day Smoker    Packs/day: 0.10    Types: Cigarettes  . Smokeless tobacco: Never Used  Substance Use Topics  . Alcohol use: No  . Drug use: No     Allergies   Sulfa antibiotics and Morphine and related   Review of Systems Review of Systems  Constitutional: Negative for chills and fever.  HENT: Positive for congestion, rhinorrhea and  sore throat.   Respiratory: Negative for chest tightness, shortness of breath and wheezing.   Cardiovascular: Negative for chest pain.  Gastrointestinal: Negative for abdominal pain, nausea and vomiting.  Genitourinary: Negative for difficulty urinating, dysuria, flank pain, pelvic pain, urgency, vaginal bleeding and vaginal discharge.  Musculoskeletal: Negative for back pain and myalgias.  Skin: Negative for rash.  Neurological: Negative for dizziness and headaches.     Physical Exam Updated Vital Signs BP 123/63   Pulse 93   Temp 98.9 F (37.2 C)   Resp 20   Ht 5\' 4"  (1.626 m)   Wt 81.6 kg (180 lb)   LMP 07/04/2016 (Approximate)    SpO2 98%   BMI 30.90 kg/m   Physical Exam  Constitutional: She is oriented to person, place, and time. She appears well-developed and well-nourished.  Ambulatory with no antalgic gait.  She ambulates with no assistive devices  HENT:  Head: Normocephalic and atraumatic.  Right Ear: External ear normal.  Left Ear: External ear normal.  Nose: Nose normal.  Mouth/Throat: Oropharynx is clear and moist. No oropharyngeal exudate.  Mild pharyngeal erythema with no exudates.  Uvula is midline with no uvula swelling.  Eyes: Conjunctivae are normal.  Neck: Normal range of motion.  Cardiovascular: Normal rate and regular rhythm.  Pulmonary/Chest: Effort normal and breath sounds normal. No respiratory distress. She has no wheezes. She has no rales.  Abdominal: Soft. She exhibits no distension. There is no tenderness.  Lymphadenopathy:    She has cervical adenopathy (posterior cervical lymphadenopathy).  Neurological: She is alert and oriented to person, place, and time. Coordination normal.  Skin: Skin is warm. No rash noted.  Psychiatric: She has a normal mood and affect. Her behavior is normal.     ED Treatments / Results  Labs (all labs ordered are listed, but only abnormal results are displayed) Labs Reviewed  POCT RAPID STREP A    EKG  EKG Interpretation None       Radiology No results found.  Procedures Procedures (including critical care time)  Medications Ordered in ED Medications - No data to display   Initial Impression / Assessment and Plan / ED Course  I have reviewed the triage vital signs and the nursing notes.  Pertinent labs & imaging results that were available during my care of the patient were reviewed by me and considered in my medical decision making (see chart for details).     36 year old female with [redacted] weeks pregnant presents with upper respiratory infection for 3 days.  She also had some complaints of dizziness with standing, orthostatics were  within normal limits.  Vital signs within normal limits.  Fetal heart tones within normal limits.  Patient tolerating p.o. fluids well.  Denied any chest pain or shortness of breath.  Rapid strep test negative.  I went into the room to discuss results of rapid strep test and orthostatics with the patient.  I explained to patient that her symptoms seem to be viral and that she did not need an antibiotic, recommended symptomatic relief with dextromethorphan, guaifenesin, nasal saline sprays and Tylenol.  Patient stated that her symptoms have now been present for 7 days, she was now having chest pain, a known employee in which she had contact with was recently diagnosed with bronchitis and pneumonia.  Patient stated she felt as if we were not running proper test here and she would not be coming back to this facility for further treatment.  Patient stood up and left the room.  I tried to stop patient and talk to her about the new information she was providing such as chest pain and longer duration of symptoms the patient continued to walk away leaving the Flex care area.  Final Clinical Impressions(s) / ED Diagnoses   Final diagnoses:  Viral upper respiratory tract infection  [redacted] weeks gestation of pregnancy    ED Discharge Orders    None       Ronnette JuniperGaines, Brylan Seubert C, PA-C 01/17/17 1221    Myrna BlazerSchaevitz, David Matthew, MD 01/17/17 [id number]

## 2017-01-17 NOTE — Discharge Instructions (Addendum)
Please take Tylenol as needed for body aches.  You may also use dextromethorphan and guaifenesin as needed for your cough.  You may also use over-the-counter Nettie pot or nasal saline spray to help with nasal congestion.  Please make sure you are drinking lots of fluids.  Return to the emergency department for any fevers, chest pain, shortness of breath, worsening symptoms or urgent changes in her health.

## 2017-01-17 NOTE — ED Notes (Signed)
PA was at bedside talking with pt.  He reports to RN that pt wanted antibiotics and further treatment and she walked out of room while he was discussing treatment plans with her.  Pt not in room to sign AMA form.

## 2017-01-20 ENCOUNTER — Encounter: Payer: Medicaid Other | Admitting: Obstetrics & Gynecology

## 2017-01-20 ENCOUNTER — Ambulatory Visit (INDEPENDENT_AMBULATORY_CARE_PROVIDER_SITE_OTHER): Payer: Medicaid Other | Admitting: Obstetrics & Gynecology

## 2017-01-20 VITALS — BP 122/71 | HR 74 | Wt 183.4 lb

## 2017-01-20 DIAGNOSIS — Z23 Encounter for immunization: Secondary | ICD-10-CM | POA: Diagnosis not present

## 2017-01-20 DIAGNOSIS — Z3403 Encounter for supervision of normal first pregnancy, third trimester: Secondary | ICD-10-CM

## 2017-01-20 DIAGNOSIS — O34219 Maternal care for unspecified type scar from previous cesarean delivery: Secondary | ICD-10-CM

## 2017-01-20 NOTE — Progress Notes (Signed)
   PRENATAL VISIT NOTE  Subjective:  Holly Gordon is a 36 y.o. [phone] at 8254w4d being seen today for ongoing prenatal care.  She is currently monitored for the following issues for this low-risk pregnancy and has Supervision of other normal pregnancy, antepartum; History of cesarean delivery affecting pregnancy; Obesity affecting pregnancy in first trimester; Body mass index (BMI) of 25.0 to 25.9 in adult; History of substance abuse; Tobacco abuse; History of depression; Imprisonment and other incarceration; Cervical dysplasia; Marijuana abuse; and UTI in pregnancy, antepartum on their problem list.  Patient reports no complaints.  Movement: Present. Denies leaking of fluid.   The following portions of the patient's history were reviewed and updated as appropriate: allergies, current medications, past family history, past medical history, past social history, past surgical history and problem list. Problem list updated.  Objective:   Vitals:   01/20/17 0820  BP: 122/71  Pulse: 74  Weight: 183 lb 6.4 oz (83.2 kg)    Fetal Status: Fetal Heart Rate (bpm): 145 Fundal Height: 28 cm Movement: Present     General:  Alert, oriented and cooperative. Patient is in no acute distress.  Skin: Skin is warm and dry. No rash noted.   Cardiovascular: Normal heart rate noted  Respiratory: Normal respiratory effort, no problems with respiration noted  Abdomen: Soft, gravid, appropriate for gestational age.  Pain/Pressure: Absent     Pelvic: Cervical exam deferred        Extremities: Normal range of motion.  Edema: None  Mental Status:  Normal mood and affect. Normal behavior. Normal judgment and thought content.   Assessment and Plan:  Pregnancy: [id number] at 6754w4d  1. History of cesarean delivery affecting pregnancy Considering TOLAC now, risks reviewed, will review consent in detail and make decision later.   2. Encounter for supervision of normal first pregnancy in third trimester Third  trimester labs - RPR - CBC - Glucose Tolerance, 2 Hours w/1 Hour - HIV antibody Preterm labor symptoms and general obstetric precautions including but not limited to vaginal bleeding, contractions, leaking of fluid and fetal movement were reviewed in detail with the patient. Please refer to After Visit Summary for other counseling recommendations.  Return in about 2 weeks (around 02/03/2017) for OB Visit.   Jaynie CollinsUgonna Taja Pentland, MD

## 2017-01-20 NOTE — Patient Instructions (Signed)
Return to clinic for any scheduled appointments or obstetric concerns, or go to [facility] for evaluation  

## 2017-01-20 NOTE — Addendum Note (Signed)
Addended by: Cheree DittoGRAHAM, DEMETRICE A on: 01/20/2017 10:20 AM   Modules accepted: Orders

## 2017-01-21 ENCOUNTER — Encounter: Payer: Self-pay | Admitting: Obstetrics & Gynecology

## 2017-01-21 LAB — CBC
HEMATOCRIT: 37.3 % (ref 34.0–46.6)
Hemoglobin: 12.6 g/dL (ref 11.1–15.9)
MCH: 31.6 pg (ref 26.6–33.0)
MCHC: 33.8 g/dL (ref 31.5–35.7)
MCV: 94 fL (ref 79–97)
PLATELETS: 193 10*3/uL (ref 150–379)
RBC: 3.99 x10E6/uL (ref 3.77–5.28)
RDW: 13.1 % (ref 12.3–15.4)
WBC: 9.7 10*3/uL (ref 3.4–10.8)

## 2017-01-21 LAB — RPR: RPR Ser Ql: NONREACTIVE

## 2017-01-21 LAB — GLUCOSE TOLERANCE, 2 HOURS W/ 1HR
GLUCOSE, 2 HOUR: 92 mg/dL (ref 65–152)
Glucose, 1 hour: 133 mg/dL (ref 65–179)
Glucose, Fasting: 70 mg/dL (ref 65–91)

## 2017-01-21 LAB — HIV ANTIBODY (ROUTINE TESTING W REFLEX): HIV SCREEN 4TH GENERATION: NONREACTIVE

## 2017-01-23 ENCOUNTER — Encounter: Payer: Self-pay | Admitting: Obstetrics & Gynecology

## 2017-02-03 ENCOUNTER — Ambulatory Visit (INDEPENDENT_AMBULATORY_CARE_PROVIDER_SITE_OTHER): Payer: Medicaid Other | Admitting: Obstetrics & Gynecology

## 2017-02-03 ENCOUNTER — Encounter: Payer: Self-pay | Admitting: Obstetrics & Gynecology

## 2017-02-03 VITALS — BP 131/79 | HR 80 | Wt 183.0 lb

## 2017-02-03 DIAGNOSIS — Z3480 Encounter for supervision of other normal pregnancy, unspecified trimester: Secondary | ICD-10-CM

## 2017-02-03 DIAGNOSIS — O34219 Maternal care for unspecified type scar from previous cesarean delivery: Secondary | ICD-10-CM

## 2017-02-03 DIAGNOSIS — Z3483 Encounter for supervision of other normal pregnancy, third trimester: Secondary | ICD-10-CM

## 2017-02-03 NOTE — Patient Instructions (Signed)
Return to clinic for any scheduled appointments or obstetric concerns, or go to [facility] for evaluation    Cesarean Delivery Cesarean birth, or cesarean delivery, is the surgical delivery of a baby through an incision in the abdomen and the uterus. This may be referred to as a C-section. This procedure may be scheduled ahead of time, or it may be done in an emergency situation. Tell a health care provider about:  Any allergies you have.  All medicines you are taking, including vitamins, herbs, eye drops, creams, and over-the-counter medicines.  Any problems you or family members have had with anesthetic medicines.  Any blood disorders you have.  Any surgeries you have had.  Any medical conditions you have.  Whether you or any members of your family have a history of deep vein thrombosis (DVT) or pulmonary embolism (PE). What are the risks? Generally, this is a safe procedure. However, problems may occur, including:  Infection.  Bleeding.  Allergic reactions to medicines.  Damage to other structures or organs.  Blood clots.  Injury to your baby.  What happens before the procedure?  Follow instructions from your health care provider about eating or drinking restrictions.  Follow instructions from your health care provider about bathing before your procedure to help reduce your risk of infection.  If you know that you are going to have a cesarean delivery, do not shave your pubic area. Shaving before the procedure may increase your risk of infection.  Ask your health care provider about: ? Changing or stopping your regular medicines. This is especially important if you are taking diabetes medicines or blood thinners. ? Your pain management plan. This is especially important if you plan to breastfeed your baby. ? How long you will be in the hospital after the procedure. ? Any concerns you may have about receiving blood products if you need them during the procedure. ? Cord  blood banking, if you plan to collect your baby's umbilical cord blood.  You may also want to ask your health care provider: ? Whether you will be able to hold or breastfeed your baby while you are still in the operating room. ? Whether your baby can stay with you immediately after the procedure and during your recovery. ? Whether a family member or a person of your choice can go with you into the operating room and stay with you during the procedure, immediately after the procedure, and during your recovery.  Plan to have someone drive you home when you are discharged from the hospital. What happens during the procedure?  Fetal monitors will be placed on your abdomen to monitor your heart rate and your baby's heart rate.  Depending on the reason for your cesarean delivery, you may have a physical exam or additional testing, such as an ultrasound.  An IV tube will be inserted into one of your veins.  You may have your blood or urine tested.  You will be given antibiotic medicine to help prevent infection.  You may be given a special warming gown to wear to keep your temperature stable.  Hair may be removed from your pubic area.  The skin of your pubic area and lower abdomen will be cleaned with a germ-killing solution (antiseptic).  A catheter may be inserted into your bladder through your urethra. This drains your urine during the procedure.  You may be given one or more of the following: ? A medicine to numb the area (local anesthetic). ? A medicine to make you   asleep (general anesthetic). ? A medicine (regional anesthetic) that is injected into your back or through a small thin tube placed in your back (spinal anesthetic or epidural anesthetic). This numbs everything below the injection site and allows you to stay awake during your procedure. If this makes you feel nauseous, tell your health care provider. Medicines will be available to help reduce any nausea you may feel.  An  incision will be made in your abdomen, and then in your uterus.  If you are awake during your procedure, you may feel tugging and pulling in your abdomen, but you should not feel pain. If you feel pain, tell your health care provider immediately.  Your baby will be removed from your uterus. You may feel more pressure or pushing while this happens.  Immediately after birth, your baby will be dried and kept warm. You may be able to hold and breastfeed your baby. The umbilical cord may be clamped and cut during this time.  Your placenta will be removed from your uterus.  Your incisions will be closed with stitches (sutures). Staples, skin glue, or adhesive strips may also be applied to the incision in your abdomen.  Bandages (dressings) will be placed over the incision in your abdomen. The procedure may vary among health care providers and hospitals. What happens after the procedure?  Your blood pressure, heart rate, breathing rate, and blood oxygen level will be monitored often until the medicines you were given have worn off.  You may continue to receive fluids and medicines through an IV tube.  You will have some pain. Medicines will be available to help control your pain.  To help prevent blood clots: ? You may be given medicines. ? You may have to wear compression stockings or devices. ? You will be encouraged to walk around when you are able.  Hospital staff will encourage and support bonding with your baby. Your hospital may allow you and your baby to stay in the same room (rooming in) during your hospital stay to encourage successful breastfeeding.  You may be encouraged to cough and breathe deeply often. This helps to prevent lung problems.  If you have a catheter draining your urine, it will be removed as soon as possible after your procedure. This information is not intended to replace advice given to you by your health care provider. Make sure you discuss any questions you  have with your health care provider. Document Released: 02/17/2005 Document Revised: 07/26/2015 Document Reviewed: 11/28/2014 Elsevier Interactive Patient Education  2017 [employer]

## 2017-02-03 NOTE — Progress Notes (Signed)
   PRENATAL VISIT NOTE  Subjective:  Holly Gordon is a 36 y.o. [phone] at 809w4d being seen today for ongoing prenatal care.  She is currently monitored for the following issues for this low-risk pregnancy and has Supervision of other normal pregnancy, antepartum; History of cesarean delivery affecting pregnancy; Obesity in pregnancy, antepartum; Body mass index (BMI) of 25.0 to 25.9 in adult; History of substance abuse; Tobacco abuse; History of depression; Imprisonment and other incarceration; Cervical dysplasia; and Marijuana abuse on their problem list.  Patient reports no complaints.  Contractions: Not present. Vag. Bleeding: None.  Movement: Present. Denies leaking of fluid.   The following portions of the patient's history were reviewed and updated as appropriate: allergies, current medications, past family history, past medical history, past social history, past surgical history and problem list. Problem list updated.  Objective:   Vitals:   02/03/17 1140  BP: 131/79  Pulse: 80  Weight: 183 lb (83 kg)    Fetal Status: Fetal Heart Rate (bpm): 140 Fundal Height: 30 cm Movement: Present     General:  Alert, oriented and cooperative. Patient is in no acute distress.  Skin: Skin is warm and dry. No rash noted.   Cardiovascular: Normal heart rate noted  Respiratory: Normal respiratory effort, no problems with respiration noted  Abdomen: Soft, gravid, appropriate for gestational age.  Pain/Pressure: Absent     Pelvic: Cervical exam deferred        Extremities: Normal range of motion.  Edema: None  Mental Status:  Normal mood and affect. Normal behavior. Normal judgment and thought content.   Assessment and Plan:  Pregnancy: [id number] at 549w4d  1. History of cesarean delivery affecting pregnancy After consideration of risks, she desires RCS.  Will send in surgical order to get it scheduled at 39 weeks.  2. Supervision of other normal pregnancy, antepartum Preterm labor  symptoms and general obstetric precautions including but not limited to vaginal bleeding, contractions, leaking of fluid and fetal movement were reviewed in detail with the patient. Please refer to After Visit Summary for other counseling recommendations.  Return in about 2 weeks (around 02/17/2017) for OB Visit.   Jaynie CollinsUgonna Offie Waide, MD

## 2017-02-04 ENCOUNTER — Encounter (HOSPITAL_COMMUNITY): Payer: Self-pay

## 2017-02-16 ENCOUNTER — Telehealth: Payer: Self-pay | Admitting: Pharmacy Technician

## 2017-02-16 NOTE — Telephone Encounter (Signed)
Patient has active Medicaid.  [facility] unable to provide medication assistance.  Pt notified.  Holly DacostaBetty J. Raed Gordon Care Manager [facility]

## 2017-02-17 ENCOUNTER — Ambulatory Visit (INDEPENDENT_AMBULATORY_CARE_PROVIDER_SITE_OTHER): Payer: Medicaid Other | Admitting: Obstetrics and Gynecology

## 2017-02-17 VITALS — BP 117/86 | HR 72 | Wt 184.6 lb

## 2017-02-17 DIAGNOSIS — O34219 Maternal care for unspecified type scar from previous cesarean delivery: Secondary | ICD-10-CM

## 2017-02-17 MED ORDER — RANITIDINE HCL 150 MG PO CAPS: 150.0000 mg | ORAL_CAPSULE | Freq: Two times a day (BID) | ORAL | 1 refills | Status: DC

## 2017-02-17 NOTE — Progress Notes (Signed)
Prenatal Visit Note Date: 02/17/2017 Clinic: [facility]  Subjective:  Holly Holly Gordon is a 36 y.o. [id number] at 476w4d being seen today for ongoing prenatal care.  She is currently monitored for the following issues for this low-risk pregnancy and has Supervision of other normal pregnancy, antepartum; History of cesarean delivery affecting pregnancy; Obesity in pregnancy, antepartum; Body mass index (BMI) of 25.0 to 25.9 in adult; History of substance abuse; Tobacco abuse; History of depression; Imprisonment and other incarceration; Cervical dysplasia; and Marijuana abuse on their problem list.  Patient reports no complaints.   Contractions: Not present. Vag. Bleeding: None.  Movement: Present. Denies leaking of fluid.   The following portions of the patient's history were reviewed and updated as appropriate: allergies, current medications, past family history, past medical history, past social history, past surgical history and problem list. Problem list updated.  Objective:   Vitals:   02/17/17 1326  BP: 117/86  Pulse: 72  Weight: 184 lb 9.6 oz (83.7 kg)    Fetal Status: Fetal Heart Rate (bpm): 154 Fundal Height: 32 cm Movement: Present     General:  Alert, oriented and cooperative. Patient is in no acute distress.  Skin: Skin is warm and dry. No rash noted.   Cardiovascular: Normal heart rate noted  Respiratory: Normal respiratory effort, no problems with respiration noted  Abdomen: Soft, gravid, appropriate for gestational age. Pain/Pressure: Absent     Pelvic:  Cervical exam deferred        Extremities: Normal range of motion.  Edema: None  Mental Status: Normal mood and affect. Normal behavior. Normal judgment and thought content.   Urinalysis:      Assessment and Plan:  Pregnancy: [id number] at 6276w4d  1. History of cesarean delivery affecting pregnancy Already scheduled. Undecided on BC, maybe depo  Preterm labor symptoms and general obstetric  precautions including but not limited to vaginal bleeding, contractions, leaking of fluid and fetal movement were reviewed in detail with the patient. Please refer to After Visit Summary for other counseling recommendations.  Return in about 2 weeks (around 03/03/2017).   [place] Holly Gordon, Holly Seelye, MD

## 2017-03-04 ENCOUNTER — Ambulatory Visit (INDEPENDENT_AMBULATORY_CARE_PROVIDER_SITE_OTHER): Payer: Medicaid Other | Admitting: Student

## 2017-03-04 VITALS — BP 123/73 | HR 72 | Wt 186.0 lb

## 2017-03-04 DIAGNOSIS — O34219 Maternal care for unspecified type scar from previous cesarean delivery: Secondary | ICD-10-CM

## 2017-03-04 DIAGNOSIS — Z3480 Encounter for supervision of other normal pregnancy, unspecified trimester: Secondary | ICD-10-CM

## 2017-03-04 DIAGNOSIS — Z3483 Encounter for supervision of other normal pregnancy, third trimester: Secondary | ICD-10-CM

## 2017-03-04 NOTE — Patient Instructions (Signed)
Cesarean Delivery Cesarean birth, or cesarean delivery, is the surgical delivery of a baby through an incision in the abdomen and the uterus. This may be referred to as a C-section. This procedure may be scheduled ahead of time, or it may be done in an emergency situation. Tell a health care provider about:  Any allergies you have.  All medicines you are taking, including vitamins, herbs, eye drops, creams, and over-the-counter medicines.  Any problems you or family members have had with anesthetic medicines.  Any blood disorders you have.  Any surgeries you have had.  Any medical conditions you have.  Whether you or any members of your family have a history of deep vein thrombosis (DVT) or pulmonary embolism (PE). What are the risks? Generally, this is a safe procedure. However, problems may occur, including:  Infection.  Bleeding.  Allergic reactions to medicines.  Damage to other structures or organs.  Blood clots.  Injury to your baby.  What happens before the procedure?  Follow instructions from your health care provider about eating or drinking restrictions.  Follow instructions from your health care provider about bathing before your procedure to help reduce your risk of infection.  If you know that you are going to have a cesarean delivery, do not shave your pubic area. Shaving before the procedure may increase your risk of infection.  Ask your health care provider about: ? Changing or stopping your regular medicines. This is especially important if you are taking diabetes medicines or blood thinners. ? Your pain management plan. This is especially important if you plan to breastfeed your baby. ? How long you will be in the hospital after the procedure. ? Any concerns you may have about receiving blood products if you need them during the procedure. ? Cord blood banking, if you plan to collect your baby's umbilical cord blood.  You may also want to ask your  health care provider: ? Whether you will be able to hold or breastfeed your baby while you are still in the operating room. ? Whether your baby can stay with you immediately after the procedure and during your recovery. ? Whether a family member or a person of your choice can go with you into the operating room and stay with you during the procedure, immediately after the procedure, and during your recovery.  Plan to have someone drive you home when you are discharged from the hospital. What happens during the procedure?  Fetal monitors will be placed on your abdomen to monitor your heart rate and your baby's heart rate.  Depending on the reason for your cesarean delivery, you may have a physical exam or additional testing, such as an ultrasound.  An IV tube will be inserted into one of your veins.  You may have your blood or urine tested.  You will be given antibiotic medicine to help prevent infection.  You may be given a special warming gown to wear to keep your temperature stable.  Hair may be removed from your pubic area.  The skin of your pubic area and lower abdomen will be cleaned with a germ-killing solution (antiseptic).  A catheter may be inserted into your bladder through your urethra. This drains your urine during the procedure.  You may be given one or more of the following: ? A medicine to numb the area (local anesthetic). ? A medicine to make you fall asleep (general anesthetic). ? A medicine (regional anesthetic) that is injected into your back or through a small   thin tube placed in your back (spinal anesthetic or epidural anesthetic). This numbs everything below the injection site and allows you to stay awake during your procedure. If this makes you feel nauseous, tell your health care provider. Medicines will be available to help reduce any nausea you may feel.  An incision will be made in your abdomen, and then in your uterus.  If you are awake during your  procedure, you may feel tugging and pulling in your abdomen, but you should not feel pain. If you feel pain, tell your health care provider immediately.  Your baby will be removed from your uterus. You may feel more pressure or pushing while this happens.  Immediately after birth, your baby will be dried and kept warm. You may be able to hold and breastfeed your baby. The umbilical cord may be clamped and cut during this time.  Your placenta will be removed from your uterus.  Your incisions will be closed with stitches (sutures). Staples, skin glue, or adhesive strips may also be applied to the incision in your abdomen.  Bandages (dressings) will be placed over the incision in your abdomen. The procedure may vary among health care providers and hospitals. What happens after the procedure?  Your blood pressure, heart rate, breathing rate, and blood oxygen level will be monitored often until the medicines you were given have worn off.  You may continue to receive fluids and medicines through an IV tube.  You will have some pain. Medicines will be available to help control your pain.  To help prevent blood clots: ? You may be given medicines. ? You may have to wear compression stockings or devices. ? You will be encouraged to walk around when you are able.  Hospital staff will encourage and support bonding with your baby. Your hospital may allow you and your baby to stay in the same room (rooming in) during your hospital stay to encourage successful breastfeeding.  You may be encouraged to cough and breathe deeply often. This helps to prevent lung problems.  If you have a catheter draining your urine, it will be removed as soon as possible after your procedure. This information is not intended to replace advice given to you by your health care provider. Make sure you discuss any questions you have with your health care provider. Document Released: 02/17/2005 Document Revised: 07/26/2015  Document Reviewed: 11/28/2014 Elsevier Interactive Patient Education  2018 Elsevier Inc.  

## 2017-03-04 NOTE — Progress Notes (Signed)
   PRENATAL VISIT NOTE  Subjective:  Larene PickettStephanie M Everett is a 37 y.o. [id number] at 3747w6d being seen today for ongoing prenatal care.  She is currently monitored for the following issues for this low-risk pregnancy and has Supervision of other normal pregnancy, antepartum; History of cesarean delivery affecting pregnancy; Obesity in pregnancy, antepartum; Body mass index (BMI) of 25.0 to 25.9 in adult; History of substance abuse; Tobacco abuse; History of depression; Imprisonment and other incarceration; Cervical dysplasia; and Marijuana abuse on their problem list.  Patient reports no complaints. She has some slight ear pain and throat pain but denies cough, fever, chills, muscle aches, congestion. She says she doesn't feel sick.  Contractions: Not present.  .  Movement: Present. Denies leaking of fluid.   The following portions of the patient's history were reviewed and updated as appropriate: allergies, current medications, past family history, past medical history, past social history, past surgical history and problem list. Problem list updated.  Objective:   Vitals:   03/04/17 1527  BP: 123/73  Pulse: 72  Weight: 186 lb (84.4 kg)    Fetal Status: Fetal Heart Rate (bpm): 131 Fundal Height: 34 cm Movement: Present     General:  Alert, oriented and cooperative. Patient is in no acute distress.  Skin: Skin is warm and dry. No rash noted.   Cardiovascular: Normal heart rate noted  Respiratory: Normal respiratory effort, no problems with respiration noted  Abdomen: Soft, gravid, appropriate for gestational age.  Pain/Pressure: Absent     Pelvic: Cervical exam deferred        Extremities: Normal range of motion.  Edema: None  Mental Status:  Normal mood and affect. Normal behavior. Normal judgment and thought content.   Assessment and Plan:  Pregnancy: [id number] at 4647w6d  1. Supervision of other normal pregnancy, antepartum Doing well, no complaints. She is not sure about birth control.    -Advised patient to seek care at urgent care if she thinks she is developing a URI. Informed her that they can do strep and flu test much faster than we can; patient says that urgent care won't see her if her medicaid card says . Patient will make an appt with [facility] if she doesn't feel better in a week. Advised liquids, rest, tylenol to prevent developing URI.   2. History of cesarean delivery affecting pregnancy Repeat is already scheduled.   Preterm labor symptoms and general obstetric precautions including but not limited to vaginal bleeding, contractions, leaking of fluid and fetal movement were reviewed in detail with the patient. Please refer to After Visit Summary for other counseling recommendations.  Return in about 2 weeks (around 03/18/2017) for ROB.   Marylene LandKathryn Lorraine Rozelle Caudle, CNM

## 2017-03-06 ENCOUNTER — Encounter: Payer: Self-pay | Admitting: Student

## 2017-03-07 ENCOUNTER — Encounter (HOSPITAL_COMMUNITY): Payer: Self-pay

## 2017-03-07 ENCOUNTER — Other Ambulatory Visit: Payer: Self-pay

## 2017-03-07 ENCOUNTER — Inpatient Hospital Stay (HOSPITAL_COMMUNITY)
Admission: AD | Admit: 2017-03-07 | Discharge: 2017-03-07 | Disposition: A | Discharge status: Home / Self Care | Payer: Medicaid Other | Source: Ambulatory Visit | Attending: Obstetrics and Gynecology | Admitting: Obstetrics and Gynecology

## 2017-03-07 DIAGNOSIS — Z3A35 35 weeks gestation of pregnancy: Secondary | ICD-10-CM | POA: Insufficient documentation

## 2017-03-07 DIAGNOSIS — J0100 Acute maxillary sinusitis, unspecified: Secondary | ICD-10-CM | POA: Insufficient documentation

## 2017-03-07 DIAGNOSIS — Z885 Allergy status to narcotic agent status: Secondary | ICD-10-CM | POA: Insufficient documentation

## 2017-03-07 DIAGNOSIS — O99513 Diseases of the respiratory system complicating pregnancy, third trimester: Secondary | ICD-10-CM | POA: Insufficient documentation

## 2017-03-07 DIAGNOSIS — Z833 Family history of diabetes mellitus: Secondary | ICD-10-CM | POA: Insufficient documentation

## 2017-03-07 DIAGNOSIS — Z882 Allergy status to sulfonamides status: Secondary | ICD-10-CM | POA: Insufficient documentation

## 2017-03-07 DIAGNOSIS — Z3480 Encounter for supervision of other normal pregnancy, unspecified trimester: Secondary | ICD-10-CM

## 2017-03-07 DIAGNOSIS — O99333 Smoking (tobacco) complicating pregnancy, third trimester: Secondary | ICD-10-CM | POA: Diagnosis not present

## 2017-03-07 DIAGNOSIS — R51 Headache: Secondary | ICD-10-CM | POA: Diagnosis present

## 2017-03-07 DIAGNOSIS — O9989 Other specified diseases and conditions complicating pregnancy, childbirth and the puerperium: Secondary | ICD-10-CM

## 2017-03-07 DIAGNOSIS — Z8249 Family history of ischemic heart disease and other diseases of the circulatory system: Secondary | ICD-10-CM | POA: Diagnosis not present

## 2017-03-07 DIAGNOSIS — F17210 Nicotine dependence, cigarettes, uncomplicated: Secondary | ICD-10-CM | POA: Insufficient documentation

## 2017-03-07 LAB — CBC
HCT: 34.8 % — ABNORMAL LOW (ref 36.0–46.0)
Hemoglobin: 11.8 g/dL — ABNORMAL LOW (ref 12.0–15.0)
MCH: 30.7 pg (ref 26.0–34.0)
MCHC: 33.9 g/dL (ref 30.0–36.0)
MCV: 90.6 fL (ref 78.0–100.0)
Platelets: 141 10*3/uL — ABNORMAL LOW (ref 150–400)
RBC: 3.84 MIL/uL — AB (ref 3.87–5.11)
RDW: 12.9 % (ref 11.5–15.5)
WBC: 7.7 10*3/uL (ref 4.0–10.5)

## 2017-03-07 LAB — URINALYSIS, ROUTINE W REFLEX MICROSCOPIC
BILIRUBIN URINE: NEGATIVE
Glucose, UA: NEGATIVE mg/dL
Hgb urine dipstick: NEGATIVE
Ketones, ur: NEGATIVE mg/dL
Leukocytes, UA: NEGATIVE
NITRITE: NEGATIVE
Protein, ur: NEGATIVE mg/dL
Specific Gravity, Urine: 1.011 (ref 1.005–1.030)
pH: 6 (ref 5.0–8.0)

## 2017-03-07 LAB — RAPID STREP SCREEN (MED CTR MEBANE ONLY): STREPTOCOCCUS, GROUP A SCREEN (DIRECT): NEGATIVE

## 2017-03-07 LAB — INFLUENZA PANEL BY PCR (TYPE A & B)
INFLBPCR: NEGATIVE
Influenza A By PCR: NEGATIVE

## 2017-03-07 MED ORDER — BENZONATATE 100 MG PO CAPS: 100.0000 mg | ORAL_CAPSULE | Freq: Three times a day (TID) | ORAL | 0 refills | Status: DC

## 2017-03-07 MED ORDER — AMOXICILLIN-POT CLAVULANATE 875-125 MG PO TABS: 1.0000 | ORAL_TABLET | Freq: Two times a day (BID) | ORAL | 0 refills | Status: DC

## 2017-03-07 NOTE — MAU Provider Note (Signed)
Patient Holly Gordon is a 37 y.o. [phone] At [redacted]w[redacted]d here with complaints of SOB, sinus pressure, ear pain, sore throat.  She has had this pain off and on for the past month and a half. She was seen in ED on 11-17 but left after she was dissatisfied with her care (see note from PA).   She denies bleeding, leaking of fluid, or decreased fetal movements.  History     CSN: [id number]  Arrival date and time: 03/07/17 1558   First Provider Initiated Contact with Patient 03/07/17 1643      Chief Complaint  Patient presents with  . Headache   Headache   This is a chronic problem. Episode onset: her entire pregnancy. The problem occurs constantly. The quality of the pain is described as throbbing. The pain is at a severity of 4/10. Associated symptoms include coughing, ear pain, a sore throat and vomiting. Nothing aggravates the symptoms. She has tried acetaminophen for the symptoms. The treatment provided moderate relief.  URI   This is a new problem. The current episode started in the past 7 days. Progression since onset: She started feeling sick between Christmas and New Years. She got better but then it got worse around New Years.  Maximum temperature: No fever in MAU but she had fever "a little over 100" a few days ago.  Associated symptoms include congestion, coughing, ear pain, headaches, a sore throat and vomiting. Associated symptoms comments: She feels chest tightness and coughing, but the cough is a dry cough. She vomits due to coughing. She coughs up mucous and stomach acid. .  She says her HA was a 9/10 before, but then she took tylenol and it got better. It is now a 4/10.   She also has sinus pain underneath her eyes, across the bridge of her nose. She has tried Benadryl; it helped a little. She has also tried Vicks as well to open up her sinuses.  Her sinus pain has been going on since New Years.      OB History    Gravida Para Term Preterm AB Living   6 2 2  0 3 1   SAB TAB  Ectopic Multiple Live Births   2 1 0 0 2      Past Medical History:  Diagnosis Date  . Anemia   . Anxiety   . Bacterial vaginitis 08/26/2016  . Depression   . History of abnormal cervical Pap smear     Past Surgical History:  Procedure Laterality Date  . CESAREAN SECTION  2008   x1  . COLPOSCOPY     abnormal pap  . INDUCED ABORTION    . TONSILLECTOMY    . WISDOM TOOTH EXTRACTION     x4    Family History  Problem Relation Age of Onset  . Hypertension Father   . Diabetes Father   . Kidney disease Maternal Grandmother   . Heart disease Maternal Grandmother   . Alzheimer's disease Maternal Grandfather     Social History   Tobacco Use  . Smoking status: Current Every Day Smoker    Packs/day: 0.10    Types: Cigarettes  . Smokeless tobacco: Never Used  Substance Use Topics  . Alcohol use: No  . Drug use: No    Allergies:  Allergies  Allergen Reactions  . Morphine And Related Rash  . Sulfa Antibiotics Rash    Medications Prior to Admission  Medication Sig Dispense Refill Last Dose  . acetaminophen (TYLENOL) 500 MG  tablet Take 500 mg by mouth every 6 (six) hours as needed for mild pain.   03/07/2017 at 1400  . diphenhydrAMINE (BENADRYL) 25 MG tablet Take 25 mg by mouth 2 (two) times daily as needed for allergies.   03/07/2017 at 1300  . Prenatal Vit-Fe Fumarate-FA (PRENATAL MULTIVITAMIN) TABS tablet Take 1 tablet by mouth daily at 12 noon.   03/07/2017 at Unknown time  . ranitidine (ZANTAC) 150 MG capsule Take 1 capsule (150 mg total) by mouth 2 (two) times daily. (Patient taking differently: Take 150 mg by mouth 2 (two) times daily as needed for heartburn. ) 60 capsule 1 03/06/2017 at Unknown time  . triamcinolone ointment (KENALOG) 0.5 % Apply 1 application topically 2 (two) times daily. (Patient not taking: Reported on 02/17/2017) 30 g 6 Not Taking at Unknown time    Review of Systems  HENT: Positive for congestion, ear pain and sore throat.   Respiratory: Positive  for cough.   Gastrointestinal: Positive for vomiting.  Neurological: Positive for headaches.   Physical Exam   Blood pressure 126/76, pulse 82, temperature 98.6 F (37 C), temperature source Oral, resp. rate 18, height 5\' 4"  (1.626 m), weight 190 lb (86.2 kg), last menstrual period 07/04/2016, SpO2 99 %.  Physical Exam  Constitutional: She is oriented to person, place, and time. She appears well-developed.  HENT:  Head: Normocephalic.  UTA tympanic membranes due to waxy build-up. No tragal tenderness. Nares are pink and moist with no discharge or exudate; throat is slightly reddedened with no exudate.   Neck: Normal range of motion.  Respiratory: Effort normal and breath sounds normal. No respiratory distress. She has no wheezes. She has no rales. She exhibits no tenderness.  GI: Soft.  Musculoskeletal: Normal range of motion.  Neurological: She is alert and oriented to person, place, and time.  Skin: Skin is warm and dry.  Psychiatric: She has a normal mood and affect.    MAU Course  Procedures  MDM -CBC normal -Flu negative -Strep pending -NST 130 bpm with mod var, present acel, neg acel, no contractions.   Patient's vitals stable, afebrile with normal white count. Low suspicion for pneumonia; patient desires to be discharged so she can eat.   Assessment and Plan   1. Acute maxillary sinusitis, recurrence not specified   2. Supervision of other normal pregnancy, antepartum    2. Patient stable for discharge; RX for augmentin and tessalon perles sent to pharmacy.  3. List of safe medications in pregnancy given.  4. [facility].    Charlesetta GaribaldiKathryn Lorraine Bricen Victory CNM 03/07/2017, 4:44 PM

## 2017-03-07 NOTE — Discharge Instructions (Signed)
Upper Respiratory Infection, Adult Most upper respiratory infections (URIs) are caused by a virus. A URI affects the nose, throat, and upper air passages. The most common type of URI is often called "the common cold." Follow these instructions at home:  Take medicines only as told by your doctor.  Gargle warm saltwater or take cough drops to comfort your throat as told by your doctor.  Use a warm mist humidifier or inhale steam from a shower to increase air moisture. This may make it easier to breathe.  Drink enough fluid to keep your pee (urine) clear or pale yellow.  Eat soups and other clear broths.  Have a healthy diet.  Rest as needed.  Go back to work when your fever is gone or your doctor says it is okay. ? You may need to stay home longer to avoid giving your URI to others. ? You can also wear a face mask and wash your hands often to prevent spread of the virus.  Use your inhaler more if you have asthma.  Do not use any tobacco products, including cigarettes, chewing tobacco, or electronic cigarettes. If you need help quitting, ask your doctor. Contact a doctor if:  You are getting worse, not better.  Your symptoms are not helped by medicine.  You have chills.  You are getting more short of breath.  You have brown or red mucus.  You have yellow or brown discharge from your nose.  You have pain in your face, especially when you bend forward.  You have a fever.  You have puffy (swollen) neck glands.  You have pain while swallowing.  You have white areas in the back of your throat. Get help right away if:  You have very bad or constant: ? Headache. ? Ear pain. ? Pain in your forehead, behind your eyes, and over your cheekbones (sinus pain). ? Chest pain.  You have long-lasting (chronic) lung disease and any of the following: ? Wheezing. ? Long-lasting cough. ? Coughing up blood. ? A change in your usual mucus.  You have a stiff neck.  You have  changes in your: ? Vision. ? Hearing. ? Thinking. ? Mood. This information is not intended to replace advice given to you by your health care provider. Make sure you discuss any questions you have with your health care provider. Document Released: 08/06/2007 Document Revised: 10/21/2015 Document Reviewed: 05/25/2013 Elsevier Interactive Patient Education  2018 Elsevier Inc.  

## 2017-03-07 NOTE — MAU Note (Signed)
Pt reports nasal congestion, cough, sneezing, rib pain, sore throat.

## 2017-03-07 NOTE — Progress Notes (Addendum)
G6P2 @ 35.[redacted] wkga. Presents to triage for flu like symptoms. Flu and strip ordered and done. Denies lof or bleeding. +FM  EFM applied.  VSS see flow sheet for details.   Provider at bs assessing.   1800: pt d/c by RN Roda Shuttersachel S.

## 2017-03-08 ENCOUNTER — Encounter: Payer: Self-pay | Admitting: Student

## 2017-03-10 LAB — CULTURE, GROUP A STREP (THRC)

## 2017-03-18 ENCOUNTER — Other Ambulatory Visit (HOSPITAL_COMMUNITY)
Admission: RE | Admit: 2017-03-18 | Discharge: 2017-03-18 | Disposition: A | Discharge status: Home / Self Care | Payer: Medicaid Other | Source: Ambulatory Visit | Attending: Obstetrics and Gynecology | Admitting: Obstetrics and Gynecology

## 2017-03-18 ENCOUNTER — Ambulatory Visit (INDEPENDENT_AMBULATORY_CARE_PROVIDER_SITE_OTHER): Payer: Medicaid Other | Admitting: Obstetrics and Gynecology

## 2017-03-18 VITALS — BP 125/89 | HR 72 | Wt 191.4 lb

## 2017-03-18 DIAGNOSIS — Z3480 Encounter for supervision of other normal pregnancy, unspecified trimester: Secondary | ICD-10-CM

## 2017-03-18 DIAGNOSIS — O34219 Maternal care for unspecified type scar from previous cesarean delivery: Secondary | ICD-10-CM

## 2017-03-18 NOTE — Progress Notes (Signed)
   PRENATAL VISIT NOTE  Subjective:  Holly Gordon is a 37 y.o. [phone] at [redacted]w[redacted]d being seen today for ongoing prenatal care.  She is currently monitored for the following issues for this low-risk pregnancy and has Supervision of other normal pregnancy, antepartum; History of cesarean delivery affecting pregnancy; Obesity in pregnancy, antepartum; Body mass index (BMI) of 25.0 to 25.9 in adult; History of substance abuse; Tobacco abuse; History of depression; Imprisonment and other incarceration; Cervical dysplasia; and Marijuana abuse on their problem list.  Patient reports no complaints.  Contractions: Not present. Vag. Bleeding: None.  Movement: Present. Denies leaking of fluid.   The following portions of the patient's history were reviewed and updated as appropriate: allergies, current medications, past family history, past medical history, past social history, past surgical history and problem list. Problem list updated.  Objective:   Vitals:   03/18/17 1553  BP: 125/89  Pulse: 72  Weight: 191 lb 6.4 oz (86.8 kg)    Fetal Status: Fetal Heart Rate (bpm): 153 Fundal Height: 36 cm Movement: Present  Presentation: Undeterminable  General:  Alert, oriented and cooperative. Patient is in no acute distress.  Skin: Skin is warm and dry. No rash noted.   Cardiovascular: Normal heart rate noted  Respiratory: Normal respiratory effort, no problems with respiration noted  Abdomen: Soft, gravid, appropriate for gestational age.  Pain/Pressure: Absent     Pelvic: Cervical exam deferred Dilation: Closed Effacement (%): Thick Station: Ballotable  Extremities: Normal range of motion.  Edema: None  Mental Status:  Normal mood and affect. Normal behavior. Normal judgment and thought content.   Assessment and Plan:  Pregnancy: [id number] at [redacted]w[redacted]d  1. History of cesarean delivery affecting pregnancy - Planning scheduled repeat cesarean delivery  2. Supervision of other normal pregnancy,  antepartum - GBS, GC/CT done today  Preterm labor symptoms and general obstetric precautions including but not limited to vaginal bleeding, contractions, leaking of fluid and fetal movement were reviewed in detail with the patient. Please refer to After Visit Summary for other counseling recommendations.  Return in about 1 week (around 03/25/2017) for Return OB visit.   Raelyn Moraolitta Theresa Wedel, CNM

## 2017-03-18 NOTE — Patient Instructions (Signed)
Preventing Preterm Birth °Preterm birth is when your baby is delivered between 20 weeks and 37 weeks of pregnancy. A full-term pregnancy lasts for at least 37 weeks. Preterm birth can be dangerous for your baby because the last few weeks of pregnancy are an important time for your baby's brain and lungs to grow. Many things can cause a baby to be born early. Sometimes the cause is not known. There are certain factors that make you more likely to experience preterm birth, such as: °· Having a previous baby born preterm. °· Being pregnant with twins or other multiples. °· Having had fertility treatment. °· Being overweight or underweight at the start of your pregnancy. °· Having any of the following during pregnancy: °? An infection, including a urinary tract infection (UTI) or an STI (sexually transmitted infection). °? High blood pressure. °? Diabetes. °? Vaginal bleeding. °· Being age 35 or older. °· Being age 18 or younger. °· Getting pregnant within 6 months of a previous pregnancy. °· Suffering extreme stress or physical or emotional abuse during pregnancy. °· Standing for long periods of time during pregnancy, such as working at a job that requires standing. ° °What are the risks? °The most serious risk of preterm birth is that the baby may not survive. This is more likely to happen if a baby is born before 34 weeks. Other risks and complications of preterm birth may include your baby having: °· Breathing problems. °· Brain damage that affects movement and coordination (cerebral palsy). °· Feeding difficulties. °· Vision or hearing problems. °· Infections or inflammation of the digestive tract (colitis). °· Developmental delays. °· Learning disabilities. °· Higher risk for diabetes, heart disease, and high blood pressure later in life. ° °What can I do to lower my risk? °Medical care ° °The most important thing you can do to lower your risk for preterm birth is to get routine medical care during pregnancy  (prenatal care). If you have a high risk of preterm birth, you may be referred to a health care provider who specializes in managing high-risk pregnancies (perinatologist). You may be given medicine to help prevent preterm birth. °Lifestyle changes °Certain lifestyle changes can also lower your risk of preterm birth: °· Wait at least 6 months after a pregnancy to become pregnant again. °· Try to plan pregnancy for when you are between 19 and 35 years old. °· Get to a healthy weight before getting pregnant. If you are overweight, work with your health care provider to safely lose weight. °· Do not use any products that contain nicotine or tobacco, such as cigarettes and e-cigarettes. If you need help quitting, ask your health care provider. °· Do not drink alcohol. °· Do not use drugs. ° °Where to find support: °For more support, consider: °· Talking with your health care provider. °· Talking with a therapist or substance abuse counselor, if you need help quitting. °· Working with a diet and nutrition specialist (dietitian) or a personal trainer to maintain a healthy weight. °· Joining a support group. ° °Where to find more information: °Learn more about preventing preterm birth from: °· Centers for Disease Control and Prevention: cdc.gov/reproductivehealth/maternalinfanthealth/pretermbirth.htm °· March of Dimes: marchofdimes.org/complications/premature-babies.aspx °· American Pregnancy Association: americanpregnancy.org/labor-and-birth/premature-labor ° °Contact a health care provider if: °· You have any of the following signs of preterm labor before 37 weeks: °? A change or increase in vaginal discharge. °? Fluid leaking from your vagina. °? Pressure or cramps in your lower abdomen. °? A backache that does not   go away or gets worse. °? Regular tightening (contractions) in your lower abdomen. °Summary °· Preterm birth means having your baby during weeks 20-37 of pregnancy. °· Preterm birth may put your baby at risk  for physical and mental problems. °· Getting good prenatal care can help prevent preterm birth. °· You can lower your risk of preterm birth by making certain lifestyle changes, such as not smoking and not using alcohol. °This information is not intended to replace advice given to you by your health care provider. Make sure you discuss any questions you have with your health care provider. °Document Released: 04/03/2015 Document Revised: 10/27/2015 Document Reviewed: 10/27/2015 °Elsevier Interactive Patient Education © 2018 Elsevier Inc. ° °

## 2017-03-19 ENCOUNTER — Encounter (HOSPITAL_COMMUNITY): Payer: Self-pay

## 2017-03-19 LAB — CERVICOVAGINAL ANCILLARY ONLY
CHLAMYDIA, DNA PROBE: NEGATIVE
Neisseria Gonorrhea: NEGATIVE

## 2017-03-22 LAB — CULTURE, BETA STREP (GROUP B ONLY): Strep Gp B Culture: NEGATIVE

## 2017-03-25 ENCOUNTER — Ambulatory Visit (INDEPENDENT_AMBULATORY_CARE_PROVIDER_SITE_OTHER): Payer: Medicaid Other | Admitting: Family Medicine

## 2017-03-25 DIAGNOSIS — Z720 Tobacco use: Secondary | ICD-10-CM

## 2017-03-25 DIAGNOSIS — Z8659 Personal history of other mental and behavioral disorders: Secondary | ICD-10-CM

## 2017-03-25 DIAGNOSIS — O34219 Maternal care for unspecified type scar from previous cesarean delivery: Secondary | ICD-10-CM

## 2017-03-25 DIAGNOSIS — Z3480 Encounter for supervision of other normal pregnancy, unspecified trimester: Secondary | ICD-10-CM

## 2017-03-25 DIAGNOSIS — O99210 Obesity complicating pregnancy, unspecified trimester: Secondary | ICD-10-CM

## 2017-03-25 NOTE — Progress Notes (Signed)
   PRENATAL VISIT NOTE  Subjective:  Holly Gordon is a 37 y.o. [phone] at 6970w5d being seen today for ongoing prenatal care.  She is currently monitored for the following issues for this high-risk pregnancy and has Supervision of other normal pregnancy, antepartum; History of cesarean delivery affecting pregnancy; Obesity in pregnancy, antepartum; Body mass index (BMI) of 25.0 to 25.9 in adult; History of substance abuse; Tobacco abuse; History of depression; Imprisonment and other incarceration; Cervical dysplasia; and Marijuana abuse on their problem list.  Patient reports backache.  Contractions: Not present. Vag. Bleeding: None.  Movement: Present. Denies leaking of fluid.   The following portions of the patient's history were reviewed and updated as appropriate: allergies, current medications, past family history, past medical history, past social history, past surgical history and problem list. Problem list updated.  Objective:   Vitals:   03/25/17 1613  BP: 106/77  Pulse: 72    Fetal Status: Fetal Heart Rate (bpm): 141 Fundal Height: 37 cm Movement: Present  Presentation: Vertex  General:  Alert, oriented and cooperative. Patient is in no acute distress.  Skin: Skin is warm and dry. No rash noted.   Cardiovascular: Normal heart rate noted  Respiratory: Normal respiratory effort, no problems with respiration noted  Abdomen: Soft, gravid, appropriate for gestational age.  Pain/Pressure: Absent  + deep purple bruise on abdomen-- patient reports it is from the "the baby kicking her" and denies any other trauma   Pelvic: Cervical exam performed Dilation: 1 Effacement (%): 20 Station: -3  Extremities: Normal range of motion.  Edema: None  Mental Status:  Normal mood and affect. Normal behavior. Normal judgment and thought content.   Assessment and Plan:  Pregnancy: [id number] at 2570w5d  1. Imprisonment and other incarceration  2. Supervision of other normal pregnancy,  antepartum -UTD  3. History of cesarean delivery affecting pregnancy Discussed TOLAC if she goes into spontaneous labor Has CS scheduled on 1/31  4. Obesity in pregnancy, antepartum TWG= 22 lb 6.4 oz (10.2 kg)   5. Tobacco abuse Continues to smoke  6. History of depression Monitor pp for depression   Term labor symptoms and general obstetric precautions including but not limited to vaginal bleeding, contractions, leaking of fluid and fetal movement were reviewed in detail with the patient. Please refer to After Visit Summary for other counseling recommendations.  Return in 1 week (on 04/01/2017).  Future Appointments  Date Time Provider Department Center  04/02/2017  9:45 AM [facility] PAT 5 [facility] None    Federico FlakeKimberly Niles Daylyn Azbill, MD

## 2017-03-25 NOTE — Patient Instructions (Signed)
Postpartum Depression and Baby Blues The postpartum period begins right after the birth of a baby. During this time, there is often a great amount of joy and excitement. It is also a time of many changes in the life of the parents. Regardless of how many times a mother gives birth, each child brings new challenges and dynamics to the family. It is not unusual to have feelings of excitement along with confusing shifts in moods, emotions, and thoughts. All mothers are at risk of developing postpartum depression or the "baby blues." These mood changes can occur right after giving birth, or they may occur many months after giving birth. The baby blues or postpartum depression can be mild or severe. Additionally, postpartum depression can go away rather quickly, or it can be a long-term condition. What are the causes? Raised hormone levels and the rapid drop in those levels are thought to be a main cause of postpartum depression and the baby blues. A number of hormones change during and after pregnancy. Estrogen and progesterone usually decrease right after the delivery of your baby. The levels of thyroid hormone and various cortisol steroids also rapidly drop. Other factors that play a role in these mood changes include major life events and genetics. What increases the risk? If you have any of the following risks for the baby blues or postpartum depression, know what symptoms to watch out for during the postpartum period. Risk factors that may increase the likelihood of getting the baby blues or postpartum depression include:  Having a personal or family history of depression.  Having depression while being pregnant.  Having premenstrual mood issues or mood issues related to oral contraceptives.  Having a lot of life stress.  Having marital conflict.  Lacking a social support network.  Having a baby with special needs.  Having health problems, such as diabetes.  What are the signs or  symptoms? Symptoms of baby blues include:  Brief changes in mood, such as going from extreme happiness to sadness.  Decreased concentration.  Difficulty sleeping.  Crying spells, tearfulness.  Irritability.  Anxiety.  Symptoms of postpartum depression typically begin within the first month after giving birth. These symptoms include:  Difficulty sleeping or excessive sleepiness.  Marked weight loss.  Agitation.  Feelings of worthlessness.  Lack of interest in activity or food.  Postpartum psychosis is a very serious condition and can be dangerous. Fortunately, it is rare. Displaying any of the following symptoms is cause for immediate medical attention. Symptoms of postpartum psychosis include:  Hallucinations and delusions.  Bizarre or disorganized behavior.  Confusion or disorientation.  How is this diagnosed? A diagnosis is made by an evaluation of your symptoms. There are no medical or lab tests that lead to a diagnosis, but there are various questionnaires that a health care provider may use to identify those with the baby blues, postpartum depression, or psychosis. Often, a screening tool called the [place] Postnatal Depression Scale is used to diagnose depression in the postpartum period. How is this treated? The baby blues usually goes away on its own in 1-2 weeks. Social support is often all that is needed. You will be encouraged to get adequate sleep and rest. Occasionally, you may be given medicines to help you sleep. Postpartum depression requires treatment because it can last several months or longer if it is not treated. Treatment may include individual or group therapy, medicine, or both to address any social, physiological, and psychological factors that may play a role in the  depression. Regular exercise, a healthy diet, rest, and social support may also be strongly recommended. Postpartum psychosis is more serious and needs treatment right away.  Hospitalization is often needed. Follow these instructions at home:  Get as much rest as you can. Nap when the baby sleeps.  Exercise regularly. Some women find yoga and walking to be beneficial.  Eat a balanced and nourishing diet.  Do little things that you enjoy. Have a cup of tea, take a bubble bath, read your favorite magazine, or listen to your favorite music.  Avoid alcohol.  Ask for help with household chores, cooking, grocery shopping, or running errands as needed. Do not try to do everything.  Talk to people close to you about how you are feeling. Get support from your partner, family members, friends, or other new moms.  Try to stay positive in how you think. Think about the things you are grateful for.  Do not spend a lot of time alone.  Only take over-the-counter or prescription medicine as directed by your health care provider.  Keep all your postpartum appointments.  Let your health care provider know if you have any concerns. Contact a health care provider if: You are having a reaction to or problems with your medicine. Get help right away if:  You have suicidal feelings.  You think you may harm the baby or someone else. This information is not intended to replace advice given to you by your health care provider. Make sure you discuss any questions you have with your health care provider. Document Released: 11/22/2003 Document Revised: 07/26/2015 Document Reviewed: 11/29/2012 Elsevier Interactive Patient Education  2017 [employer]   Breastfeeding Choosing to breastfeed is one of the best decisions you can make for yourself and your baby. A change in hormones during pregnancy causes your breasts to make breast milk in your milk-producing glands. Hormones prevent breast milk from being released before your baby is born. They also prompt milk flow after birth. Once breastfeeding has begun, thoughts of your baby, as well as his or her sucking or crying, can  stimulate the release of milk from your milk-producing glands. Benefits of breastfeeding Research shows that breastfeeding offers many health benefits for infants and mothers. It also offers a cost-free and convenient way to feed your baby. For your baby  Your first milk (colostrum) helps your baby's digestive system to function better.  Special cells in your milk (antibodies) help your baby to fight off infections.  Breastfed babies are less likely to develop asthma, allergies, obesity, or type 2 diabetes. They are also at lower risk for sudden infant death syndrome (SIDS).  Nutrients in breast milk are better able to meet your baby's needs compared to infant formula.  Breast milk improves your baby's brain development. For you  Breastfeeding helps to create a very special bond between you and your baby.  Breastfeeding is convenient. Breast milk costs nothing and is always available at the correct temperature.  Breastfeeding helps to burn calories. It helps you to lose the weight that you gained during pregnancy.  Breastfeeding makes your uterus return faster to its size before pregnancy. It also slows bleeding (lochia) after you give birth.  Breastfeeding helps to lower your risk of developing type 2 diabetes, osteoporosis, rheumatoid arthritis, cardiovascular disease, and breast, ovarian, uterine, and endometrial cancer later in life. Breastfeeding basics Starting breastfeeding  Find a comfortable place to sit or lie down, with your neck and back well-supported.  Place a pillow or a  rolled-up blanket under your baby to bring him or her to the level of your breast (if you are seated). Nursing pillows are specially designed to help support your arms and your baby while you breastfeed.  Make sure that your baby's tummy (abdomen) is facing your abdomen.  Gently massage your breast. With your fingertips, massage from the outer edges of your breast inward toward the nipple. This  encourages milk flow. If your milk flows slowly, you may need to continue this action during the feeding.  Support your breast with 4 fingers underneath and your thumb above your nipple (make the letter "C" with your hand). Make sure your fingers are well away from your nipple and your baby's mouth.  Stroke your baby's lips gently with your finger or nipple.  When your baby's mouth is open wide enough, quickly bring your baby to your breast, placing your entire nipple and as much of the areola as possible into your baby's mouth. The areola is the colored area around your nipple. ? More areola should be visible above your baby's upper lip than below the lower lip. ? Your baby's lips should be opened and extended outward (flanged) to ensure an adequate, comfortable latch. ? Your baby's tongue should be between his or her lower gum and your breast.  Make sure that your baby's mouth is correctly positioned around your nipple (latched). Your baby's lips should create a seal on your breast and be turned out (everted).  It is common for your baby to suck about 2-3 minutes in order to start the flow of breast milk. Latching Teaching your baby how to latch onto your breast properly is very important. An improper latch can cause nipple pain, decreased milk supply, and poor weight gain in your baby. Also, if your baby is not latched onto your nipple properly, he or she may swallow some air during feeding. This can make your baby fussy. Burping your baby when you switch breasts during the feeding can help to get rid of the air. However, teaching your baby to latch on properly is still the best way to prevent fussiness from swallowing air while breastfeeding. Signs that your baby has successfully latched onto your nipple  Silent tugging or silent sucking, without causing you pain. Infant's lips should be extended outward (flanged).  Swallowing heard between every 3-4 sucks once your milk has started to flow  (after your let-down milk reflex occurs).  Muscle movement above and in front of his or her ears while sucking.  Signs that your baby has not successfully latched onto your nipple  Sucking sounds or smacking sounds from your baby while breastfeeding.  Nipple pain.  If you think your baby has not latched on correctly, slip your finger into the corner of your baby's mouth to break the suction and place it between your baby's gums. Attempt to start breastfeeding again. Signs of successful breastfeeding Signs from your baby  Your baby will gradually decrease the number of sucks or will completely stop sucking.  Your baby will fall asleep.  Your baby's body will relax.  Your baby will retain a small amount of milk in his or her mouth.  Your baby will let go of your breast by himself or herself.  Signs from you  Breasts that have increased in firmness, weight, and size 1-3 hours after feeding.  Breasts that are softer immediately after breastfeeding.  Increased milk volume, as well as a change in milk consistency and color by the fifth  day of breastfeeding.  Nipples that are not sore, cracked, or bleeding.  Signs that your baby is getting enough milk  Wetting at least 1-2 diapers during the first 24 hours after birth.  Wetting at least 5-6 diapers every 24 hours for the first week after birth. The urine should be clear or pale yellow by the age of 5 days.  Wetting 6-8 diapers every 24 hours as your baby continues to grow and develop.  At least 3 stools in a 24-hour period by the age of 5 days. The stool should be soft and yellow.  At least 3 stools in a 24-hour period by the age of 7 days. The stool should be seedy and yellow.  No loss of weight greater than 10% of birth weight during the first 3 days of life.  Average weight gain of 4-7 oz (113-198 g) per week after the age of 4 days.  Consistent daily weight gain by the age of 5 days, without weight loss after the age of  2 weeks. After a feeding, your baby may spit up a small amount of milk. This is normal. Breastfeeding frequency and duration Frequent feeding will help you make more milk and can prevent sore nipples and extremely full breasts (breast engorgement). Breastfeed when you feel the need to reduce the fullness of your breasts or when your baby shows signs of hunger. This is called "breastfeeding on demand." Signs that your baby is hungry include:  Increased alertness, activity, or restlessness.  Movement of the head from side to side.  Opening of the mouth when the corner of the mouth or cheek is stroked (rooting).  Increased sucking sounds, smacking lips, cooing, sighing, or squeaking.  Hand-to-mouth movements and sucking on fingers or hands.  Fussing or crying.  Avoid introducing a pacifier to your baby in the first 4-6 weeks after your baby is born. After this time, you may choose to use a pacifier. Research has shown that pacifier use during the first year of a baby's life decreases the risk of sudden infant death syndrome (SIDS). Allow your baby to feed on each breast as long as he or she wants. When your baby unlatches or falls asleep while feeding from the first breast, offer the second breast. Because newborns are often sleepy in the first few weeks of life, you may need to awaken your baby to get him or her to feed. Breastfeeding times will vary from baby to baby. However, the following rules can serve as a guide to help you make sure that your baby is properly fed:  Newborns (babies 75 weeks of age or younger) may breastfeed every 1-3 hours.  Newborns should not go without breastfeeding for longer than 3 hours during the day or 5 hours during the night.  You should breastfeed your baby a minimum of 8 times in a 24-hour period.  Breast milk pumping Pumping and storing breast milk allows you to make sure that your baby is exclusively fed your breast milk, even at times when you are  unable to breastfeed. This is especially important if you go back to work while you are still breastfeeding, or if you are not able to be present during feedings. Your lactation consultant can help you find a method of pumping that works best for you and give you guidelines about how long it is safe to store breast milk. Caring for your breasts while you breastfeed Nipples can become dry, cracked, and sore while breastfeeding. The following recommendations  can help keep your breasts moisturized and healthy:  Avoid using soap on your nipples.  Wear a supportive bra designed especially for nursing. Avoid wearing underwire-style bras or extremely tight bras (sports bras).  Air-dry your nipples for 3-4 minutes after each feeding.  Use only cotton bra pads to absorb leaked breast milk. Leaking of breast milk between feedings is normal.  Use lanolin on your nipples after breastfeeding. Lanolin helps to maintain your skin's normal moisture barrier. Pure lanolin is not harmful (not toxic) to your baby. You may also hand express a few drops of breast milk and gently massage that milk into your nipples and allow the milk to air-dry.  In the first few weeks after giving birth, some women experience breast engorgement. Engorgement can make your breasts feel heavy, warm, and tender to the touch. Engorgement peaks within 3-5 days after you give birth. The following recommendations can help to ease engorgement:  Completely empty your breasts while breastfeeding or pumping. You may want to start by applying warm, moist heat (in the shower or with warm, water-soaked hand towels) just before feeding or pumping. This increases circulation and helps the milk flow. If your baby does not completely empty your breasts while breastfeeding, pump any extra milk after he or she is finished.  Apply ice packs to your breasts immediately after breastfeeding or pumping, unless this is too uncomfortable for you. To do  this: ? Put ice in a plastic bag. ? Place a towel between your skin and the bag. ? Leave the ice on for 20 minutes, 2-3 times a day.  Make sure that your baby is latched on and positioned properly while breastfeeding.  If engorgement persists after 48 hours of following these recommendations, contact your health care provider or a Advertising copywriter. Overall health care recommendations while breastfeeding  Eat 3 healthy meals and 3 snacks every day. Well-nourished mothers who are breastfeeding need an additional 450-500 calories a day. You can meet this requirement by increasing the amount of a balanced diet that you eat.  Drink enough water to keep your urine pale yellow or clear.  Rest often, relax, and continue to take your prenatal vitamins to prevent fatigue, stress, and low vitamin and mineral levels in your body (nutrient deficiencies).  Do not use any products that contain nicotine or tobacco, such as cigarettes and e-cigarettes. Your baby may be harmed by chemicals from cigarettes that pass into breast milk and exposure to secondhand smoke. If you need help quitting, ask your health care provider.  Avoid alcohol.  Do not use illegal drugs or marijuana.  Talk with your health care provider before taking any medicines. These include over-the-counter and prescription medicines as well as vitamins and herbal supplements. Some medicines that may be harmful to your baby can pass through breast milk.  It is possible to become pregnant while breastfeeding. If birth control is desired, ask your health care provider about options that will be safe while breastfeeding your baby. Where to find more information: [employer]: www.llli.org Contact a health care provider if:  You feel like you want to stop breastfeeding or have become frustrated with breastfeeding.  Your nipples are cracked or bleeding.  Your breasts are red, tender, or warm.  You have: ? Painful  breasts or nipples. ? A swollen area on either breast. ? A fever or chills. ? Nausea or vomiting. ? Drainage other than breast milk from your nipples.  Your breasts do not become full before  feedings by the fifth day after you give birth.  You feel sad and depressed.  Your baby is: ? Too sleepy to eat well. ? Having trouble sleeping. ? More than 57 week old and wetting fewer than 6 diapers in a 24-hour period. ? Not gaining weight by 69 days of age.  Your baby has fewer than 3 stools in a 24-hour period.  Your baby's skin or the white parts of his or her eyes become yellow. Get help right away if:  Your baby is overly tired (lethargic) and does not want to wake up and feed.  Your baby develops an unexplained fever. Summary  Breastfeeding offers many health benefits for infant and mothers.  Try to breastfeed your infant when he or she shows early signs of hunger.  Gently tickle or stroke your baby's lips with your finger or nipple to allow the baby to open his or her mouth. Bring the baby to your breast. Make sure that much of the areola is in your baby's mouth. Offer one side and burp the baby before you offer the other side.  Talk with your health care provider or lactation consultant if you have questions or you face problems as you breastfeed. This information is not intended to replace advice given to you by your health care provider. Make sure you discuss any questions you have with your health care provider. Document Released: 02/17/2005 Document Revised: 03/21/2016 Document Reviewed: 03/21/2016 Elsevier Interactive Patient Education  [employer].

## 2017-04-01 ENCOUNTER — Ambulatory Visit (INDEPENDENT_AMBULATORY_CARE_PROVIDER_SITE_OTHER): Payer: Medicaid Other | Admitting: Student

## 2017-04-01 VITALS — BP 143/88

## 2017-04-01 DIAGNOSIS — Z3480 Encounter for supervision of other normal pregnancy, unspecified trimester: Secondary | ICD-10-CM

## 2017-04-01 DIAGNOSIS — O164 Unspecified maternal hypertension, complicating childbirth: Secondary | ICD-10-CM

## 2017-04-01 NOTE — Patient Instructions (Signed)

## 2017-04-01 NOTE — Progress Notes (Addendum)
   PRENATAL VISIT NOTE  Subjective:  Holly Gordon is a 37 y.o. [id number] at 845w5d being seen today for ongoing prenatal care.  She is currently monitored for the following issues for this low-risk pregnancy and has Supervision of other normal pregnancy, antepartum; History of cesarean delivery affecting pregnancy; Obesity in pregnancy, antepartum; Body mass index (BMI) of 25.0 to 25.9 in adult; History of substance abuse; Tobacco abuse; History of depression; Imprisonment and other incarceration; Cervical dysplasia; and Marijuana abuse on their problem list.  Patient reports no complaints She feels better; her URI has resolved.  Denies leaking of fluid.   The following portions of the patient's history were reviewed and updated as appropriate: allergies, current medications, past family history, past medical history, past social history, past surgical history and problem list. Problem list updated.  Objective:   Vitals:   04/01/17 1509  BP: (!) 143/88    Fetal Status: Fetal Heart Rate (bpm): 145 Fundal Height: 36 cm       General:  Alert, oriented and cooperative. Patient is in no acute distress.  Skin: Skin is warm and dry. No rash noted.   Cardiovascular: Normal heart rate noted  Respiratory: Normal respiratory effort, no problems with respiration noted  Abdomen: Soft, gravid, appropriate for gestational age.        Pelvic: Cervical exam deferred        Extremities: Normal range of motion.     Mental Status:  Normal mood and affect. Normal behavior. Normal judgment and thought content.   Assessment and Plan:  Pregnancy: [id number] at 6145w5d  1. Supervision of other normal pregnancy, antepartum -Patient doing well; ready for c-section on 04-03-2017.  -Two elevated blood pressure readings today (140/80-90s). Patient says it could be due to her smoking or caffeine intake today.  Denies HA, blurry vision, edema, epigastric pain. Will draw pre-e labs today; pre-eclamptic precautions  reviewed.   Term labor symptoms and general obstetric precautions including but not limited to vaginal bleeding, contractions, leaking of fluid and fetal movement were reviewed in detail with the patient. Please refer to After Visit Summary for other counseling recommendations.  No Follow-up on file.   Marylene LandKathryn Lorraine Sudais Banghart, CNM

## 2017-04-01 NOTE — Patient Instructions (Addendum)
   Elenore PaddyStephanie M Quiggle  04/01/2017   Your procedure is scheduled on:  04/03/2017  Enter through [facility] 0930 AM.  Pick up the phone at the desk and dial [phone]  Call this number if you have problems the morning of surgery:[phone]  Remember:   Do not eat food:(After Midnight) Desps de medianoche.  Do not drink clear liquids: (After Midnight) Desps de medianoche.  Take these medicines the morning of surgery with A SIP OF WATER: may take zantac   Do not wear jewelry, make-up or nail polish.  Do not wear lotions, powders, or perfumes. Do not wear deodorant.  Do not shave 48 hours prior to surgery.  Do not bring valuables to the hospital.  [facility] is not   responsible for any belongings or valuables brought to the hospital.  Contacts, dentures or bridgework may not be worn into surgery.  Leave suitcase in the car. After surgery it may be brought to your room.  For patients admitted to the hospital, checkout time is 11:00 AM the day of              discharge.    N/A   Please read over the following fact sheets that you were given:   Surgical Site Infection Prevention

## 2017-04-01 NOTE — Addendum Note (Signed)
Addended by: Chrystie NoseKOOISTRA, KATHRYN L on: 04/01/2017 03:22 PM   Modules accepted: Orders

## 2017-04-02 ENCOUNTER — Telehealth: Payer: Self-pay | Admitting: Student

## 2017-04-02 ENCOUNTER — Encounter (HOSPITAL_COMMUNITY)
Admission: RE | Admit: 2017-04-02 | Discharge: 2017-04-02 | Disposition: A | Discharge status: Home / Self Care | Payer: Medicaid Other | Source: Ambulatory Visit | Attending: Obstetrics and Gynecology | Admitting: Obstetrics and Gynecology

## 2017-04-02 HISTORY — DX: Unspecified abnormal cytological findings in specimens from vagina: R87.629

## 2017-04-02 LAB — COMPREHENSIVE METABOLIC PANEL
ALBUMIN: 3.9 g/dL (ref 3.5–5.5)
ALT: 13 IU/L (ref 0–32)
AST: 18 IU/L (ref 0–40)
Albumin/Globulin Ratio: 1.4 (ref 1.2–2.2)
Alkaline Phosphatase: 137 IU/L — ABNORMAL HIGH (ref 39–117)
BUN / CREAT RATIO: 8 — AB (ref 9–23)
BUN: 4 mg/dL — AB (ref 6–20)
Bilirubin Total: 0.3 mg/dL (ref 0.0–1.2)
CO2: 19 mmol/L — AB (ref 20–29)
CREATININE: 0.52 mg/dL — AB (ref 0.57–1.00)
Calcium: 9.6 mg/dL (ref 8.7–10.2)
Chloride: 101 mmol/L (ref 96–106)
GFR calc Af Amer: 144 mL/min/{1.73_m2} (ref >59)
GFR, EST NON AFRICAN AMERICAN: 125 mL/min/{1.73_m2} (ref >59)
GLOBULIN, TOTAL: 2.8 g/dL (ref 1.5–4.5)
GLUCOSE: 86 mg/dL (ref 65–99)
Potassium: 4.2 mmol/L (ref 3.5–5.2)
SODIUM: 137 mmol/L (ref 134–144)
Total Protein: 6.7 g/dL (ref 6.0–8.5)

## 2017-04-02 LAB — CBC
HCT: 40.3 % (ref 36.0–46.0)
HEMATOCRIT: 40.2 % (ref 34.0–46.6)
HEMOGLOBIN: 13.6 g/dL (ref 11.1–15.9)
HEMOGLOBIN: 13.9 g/dL (ref 12.0–15.0)
MCH: 30.4 pg (ref 26.6–33.0)
MCH: 31.2 pg (ref 26.0–34.0)
MCHC: 33.8 g/dL (ref 31.5–35.7)
MCHC: 34.5 g/dL (ref 30.0–36.0)
MCV: 90 fL (ref 79–97)
MCV: 90.4 fL (ref 78.0–100.0)
PLATELETS: 180 10*3/uL (ref 150–400)
Platelets: 211 10*3/uL (ref 150–379)
RBC: 4.48 x10E6/uL (ref 3.77–5.28)
RBC: 4.46 MIL/uL (ref 3.87–5.11)
RDW: 13.4 % (ref 12.3–15.4)
RDW: 13.4 % (ref 11.5–15.5)
WBC: 10.5 10*3/uL (ref 3.4–10.8)
WBC: 16 10*3/uL — AB (ref 4.0–10.5)

## 2017-04-02 LAB — TYPE AND SCREEN
ABO/RH(D): O POS
Antibody Screen: NEGATIVE

## 2017-04-02 LAB — PROTEIN / CREATININE RATIO, URINE
CREATININE, UR: 50.4 mg/dL
PROTEIN UR: 8.7 mg/dL
Protein/Creat Ratio: 173 mg/g creat (ref 0–200)

## 2017-04-02 NOTE — Telephone Encounter (Signed)
Left message with patient reporting normal labs; will call if urine comes back with any concerning results.

## 2017-04-03 ENCOUNTER — Inpatient Hospital Stay (HOSPITAL_COMMUNITY): Payer: Medicaid Other | Admitting: Registered Nurse

## 2017-04-03 ENCOUNTER — Encounter (HOSPITAL_COMMUNITY)
Admission: AD | Disposition: A | Discharge status: Home / Self Care | Payer: Self-pay | Source: Ambulatory Visit | Attending: Obstetrics and Gynecology

## 2017-04-03 ENCOUNTER — Encounter (HOSPITAL_COMMUNITY): Payer: Self-pay | Admitting: General Practice

## 2017-04-03 ENCOUNTER — Inpatient Hospital Stay (HOSPITAL_COMMUNITY)
Admission: AD | Admit: 2017-04-03 | Discharge: 2017-04-05 | DRG: 787 | Disposition: A | Discharge status: Home / Self Care | Payer: Medicaid Other | Source: Ambulatory Visit | Attending: Obstetrics and Gynecology | Admitting: Obstetrics and Gynecology

## 2017-04-03 DIAGNOSIS — Z3A39 39 weeks gestation of pregnancy: Secondary | ICD-10-CM

## 2017-04-03 DIAGNOSIS — O99323 Drug use complicating pregnancy, third trimester: Secondary | ICD-10-CM | POA: Diagnosis present

## 2017-04-03 DIAGNOSIS — F17210 Nicotine dependence, cigarettes, uncomplicated: Secondary | ICD-10-CM | POA: Diagnosis present

## 2017-04-03 DIAGNOSIS — O34211 Maternal care for low transverse scar from previous cesarean delivery: Secondary | ICD-10-CM | POA: Diagnosis not present

## 2017-04-03 DIAGNOSIS — O99334 Smoking (tobacco) complicating childbirth: Secondary | ICD-10-CM | POA: Diagnosis present

## 2017-04-03 DIAGNOSIS — F1290 Cannabis use, unspecified, uncomplicated: Secondary | ICD-10-CM | POA: Diagnosis present

## 2017-04-03 DIAGNOSIS — Z98891 History of uterine scar from previous surgery: Secondary | ICD-10-CM

## 2017-04-03 LAB — CREATININE, SERUM
CREATININE: 0.59 mg/dL (ref 0.44–1.00)
GFR calc Af Amer: >60 mL/min (ref >60)

## 2017-04-03 LAB — CBC
HCT: 39.4 % (ref 36.0–46.0)
Hemoglobin: 13.4 g/dL (ref 12.0–15.0)
MCH: 30.7 pg (ref 26.0–34.0)
MCHC: 34 g/dL (ref 30.0–36.0)
MCV: 90.4 fL (ref 78.0–100.0)
Platelets: 183 10*3/uL (ref 150–400)
RBC: 4.36 MIL/uL (ref 3.87–5.11)
RDW: 13.3 % (ref 11.5–15.5)
WBC: 23.1 10*3/uL — ABNORMAL HIGH (ref 4.0–10.5)

## 2017-04-03 LAB — RPR: RPR Ser Ql: NONREACTIVE

## 2017-04-03 SURGERY — Surgical Case
Anesthesia: Spinal | Wound class: Clean Contaminated

## 2017-04-03 MED ORDER — SCOPOLAMINE 1 MG/3DAYS TD PT72
MEDICATED_PATCH | TRANSDERMAL | Status: AC
  Filled 2017-04-03: qty 1

## 2017-04-03 MED ORDER — SOD CITRATE-CITRIC ACID 500-334 MG/5ML PO SOLN
30.0000 mL | ORAL | Status: AC
  Administered 2017-04-03: 30 mL via ORAL
  Filled 2017-04-03: qty 15

## 2017-04-03 MED ORDER — COCONUT OIL OIL: 1.0000 "application " | TOPICAL_OIL | Status: DC | PRN

## 2017-04-03 MED ORDER — FENTANYL CITRATE (PF) 100 MCG/2ML IJ SOLN
INTRAMUSCULAR | Status: AC
  Filled 2017-04-03: qty 2

## 2017-04-03 MED ORDER — OXYTOCIN 10 UNIT/ML IJ SOLN
INTRAMUSCULAR | Status: AC
  Filled 2017-04-03: qty 4

## 2017-04-03 MED ORDER — SOD CITRATE-CITRIC ACID 500-334 MG/5ML PO SOLN: 30.0000 mL | Freq: Once | ORAL | Status: DC

## 2017-04-03 MED ORDER — NALOXONE HCL 0.4 MG/ML IJ SOLN: 0.4000 mg | INTRAMUSCULAR | Status: DC | PRN

## 2017-04-03 MED ORDER — SCOPOLAMINE 1 MG/3DAYS TD PT72: 1.0000 | MEDICATED_PATCH | Freq: Once | TRANSDERMAL | Status: DC

## 2017-04-03 MED ORDER — SODIUM CHLORIDE 0.9 % IR SOLN
Status: DC | PRN
  Administered 2017-04-03: 1000 mL

## 2017-04-03 MED ORDER — MEPERIDINE HCL 25 MG/ML IJ SOLN
INTRAMUSCULAR | Status: DC | PRN
  Administered 2017-04-03 (×2): 12.5 mg via INTRAVENOUS

## 2017-04-03 MED ORDER — OXYTOCIN 40 UNITS IN LACTATED RINGERS INFUSION - SIMPLE MED: 2.5000 [IU]/h | INTRAVENOUS | Status: AC

## 2017-04-03 MED ORDER — MEPERIDINE HCL 25 MG/ML IJ SOLN: 6.2500 mg | INTRAMUSCULAR | Status: DC | PRN

## 2017-04-03 MED ORDER — SENNOSIDES-DOCUSATE SODIUM 8.6-50 MG PO TABS
2.0000 | ORAL_TABLET | ORAL | Status: DC
  Administered 2017-04-04: 2 via ORAL
  Filled 2017-04-03 (×3): qty 2

## 2017-04-03 MED ORDER — ENOXAPARIN SODIUM 40 MG/0.4ML ~~LOC~~ SOLN
40.0000 mg | SUBCUTANEOUS | Status: DC
  Filled 2017-04-03 (×3): qty 0.4

## 2017-04-03 MED ORDER — DIPHENHYDRAMINE HCL 50 MG/ML IJ SOLN: 12.5000 mg | INTRAMUSCULAR | Status: DC | PRN

## 2017-04-03 MED ORDER — WITCH HAZEL-GLYCERIN EX PADS: 1.0000 "application " | MEDICATED_PAD | CUTANEOUS | Status: DC | PRN

## 2017-04-03 MED ORDER — EPHEDRINE 5 MG/ML INJ
INTRAVENOUS | Status: AC
  Filled 2017-04-03: qty 10

## 2017-04-03 MED ORDER — OXYCODONE HCL 5 MG PO TABS
10.0000 mg | ORAL_TABLET | ORAL | Status: DC | PRN
  Administered 2017-04-05: 10 mg via ORAL
  Filled 2017-04-03: qty 2

## 2017-04-03 MED ORDER — ONDANSETRON HCL 4 MG/2ML IJ SOLN
INTRAMUSCULAR | Status: DC | PRN
  Administered 2017-04-03: 4 mg via INTRAVENOUS

## 2017-04-03 MED ORDER — FAMOTIDINE 20 MG PO TABS: 20.0000 mg | ORAL_TABLET | Freq: Once | ORAL | Status: DC

## 2017-04-03 MED ORDER — SODIUM CHLORIDE 0.9% FLUSH: 3.0000 mL | INTRAVENOUS | Status: DC | PRN

## 2017-04-03 MED ORDER — NALBUPHINE HCL 10 MG/ML IJ SOLN: 5.0000 mg | INTRAMUSCULAR | Status: DC | PRN

## 2017-04-03 MED ORDER — NALBUPHINE HCL 10 MG/ML IJ SOLN: 5.0000 mg | Freq: Once | INTRAMUSCULAR | Status: DC | PRN

## 2017-04-03 MED ORDER — ACETAMINOPHEN 500 MG PO TABS
1000.0000 mg | ORAL_TABLET | Freq: Four times a day (QID) | ORAL | Status: AC
  Administered 2017-04-04 (×3): 1000 mg via ORAL
  Filled 2017-04-03 (×3): qty 2

## 2017-04-03 MED ORDER — PHENYLEPHRINE 8 MG IN D5W 100 ML (0.08MG/ML) PREMIX OPTIME
INJECTION | INTRAVENOUS | Status: AC
  Filled 2017-04-03: qty 100

## 2017-04-03 MED ORDER — LACTATED RINGERS IV SOLN
INTRAVENOUS | Status: DC
  Administered 2017-04-03 (×3): via INTRAVENOUS

## 2017-04-03 MED ORDER — OXYTOCIN 10 UNIT/ML IJ SOLN
INTRAVENOUS | Status: DC | PRN
  Administered 2017-04-03: 40 [IU] via INTRAVENOUS

## 2017-04-03 MED ORDER — SCOPOLAMINE 1 MG/3DAYS TD PT72
1.0000 | MEDICATED_PATCH | Freq: Once | TRANSDERMAL | Status: AC
  Administered 2017-04-03: 1.5 mg via TRANSDERMAL
  Administered 2017-04-03: 1 via TRANSDERMAL

## 2017-04-03 MED ORDER — MEPERIDINE HCL 25 MG/ML IJ SOLN
INTRAMUSCULAR | Status: AC
  Filled 2017-04-03: qty 1

## 2017-04-03 MED ORDER — FENTANYL CITRATE (PF) 100 MCG/2ML IJ SOLN
INTRAMUSCULAR | Status: DC | PRN
  Administered 2017-04-03: 10 ug via INTRATHECAL

## 2017-04-03 MED ORDER — KETOROLAC TROMETHAMINE 30 MG/ML IJ SOLN: 30.0000 mg | Freq: Four times a day (QID) | INTRAMUSCULAR | Status: AC | PRN

## 2017-04-03 MED ORDER — BUPIVACAINE IN DEXTROSE 0.75-8.25 % IT SOLN
INTRATHECAL | Status: DC | PRN
  Administered 2017-04-03: 1.6 mL via INTRATHECAL

## 2017-04-03 MED ORDER — ZOLPIDEM TARTRATE 5 MG PO TABS: 5.0000 mg | ORAL_TABLET | Freq: Every evening | ORAL | Status: DC | PRN

## 2017-04-03 MED ORDER — MENTHOL 3 MG MT LOZG: 1.0000 | LOZENGE | OROMUCOSAL | Status: DC | PRN

## 2017-04-03 MED ORDER — FENTANYL CITRATE (PF) 100 MCG/2ML IJ SOLN: 25.0000 ug | INTRAMUSCULAR | Status: DC | PRN

## 2017-04-03 MED ORDER — KETOROLAC TROMETHAMINE 30 MG/ML IJ SOLN
INTRAMUSCULAR | Status: AC
  Filled 2017-04-03: qty 1

## 2017-04-03 MED ORDER — DIPHENHYDRAMINE HCL 25 MG PO CAPS: 25.0000 mg | ORAL_CAPSULE | ORAL | Status: DC | PRN

## 2017-04-03 MED ORDER — SIMETHICONE 80 MG PO CHEW
80.0000 mg | CHEWABLE_TABLET | Freq: Three times a day (TID) | ORAL | Status: DC
  Administered 2017-04-04 – 2017-04-05 (×4): 80 mg via ORAL
  Filled 2017-04-03 (×10): qty 1

## 2017-04-03 MED ORDER — MORPHINE SULFATE (PF) 0.5 MG/ML IJ SOLN
INTRAMUSCULAR | Status: AC
  Filled 2017-04-03: qty 10

## 2017-04-03 MED ORDER — DIPHENHYDRAMINE HCL 25 MG PO CAPS
25.0000 mg | ORAL_CAPSULE | Freq: Four times a day (QID) | ORAL | Status: DC | PRN
  Filled 2017-04-03: qty 1

## 2017-04-03 MED ORDER — IBUPROFEN 800 MG PO TABS
800.0000 mg | ORAL_TABLET | Freq: Four times a day (QID) | ORAL | Status: DC
  Administered 2017-04-03 – 2017-04-05 (×7): 800 mg via ORAL
  Filled 2017-04-03 (×7): qty 1

## 2017-04-03 MED ORDER — KETOROLAC TROMETHAMINE 30 MG/ML IJ SOLN
INTRAMUSCULAR | Status: DC | PRN
  Administered 2017-04-03: 30 mg via INTRAVENOUS

## 2017-04-03 MED ORDER — DIBUCAINE 1 % RE OINT: 1.0000 "application " | TOPICAL_OINTMENT | RECTAL | Status: DC | PRN

## 2017-04-03 MED ORDER — OXYCODONE HCL 5 MG PO TABS
5.0000 mg | ORAL_TABLET | ORAL | Status: DC | PRN
  Administered 2017-04-04 – 2017-04-05 (×5): 5 mg via ORAL
  Filled 2017-04-03 (×5): qty 1

## 2017-04-03 MED ORDER — ONDANSETRON HCL 4 MG/2ML IJ SOLN: 4.0000 mg | Freq: Three times a day (TID) | INTRAMUSCULAR | Status: DC | PRN

## 2017-04-03 MED ORDER — ONDANSETRON HCL 4 MG/2ML IJ SOLN
INTRAMUSCULAR | Status: AC
  Filled 2017-04-03: qty 2

## 2017-04-03 MED ORDER — LACTATED RINGERS IV SOLN
INTRAVENOUS | Status: DC
  Administered 2017-04-03: 22:00:00 via INTRAVENOUS

## 2017-04-03 MED ORDER — MEPERIDINE HCL 25 MG/ML IJ SOLN
INTRAMUSCULAR | Status: DC | PRN
Start: 2017-04-03 — End: 2017-04-03

## 2017-04-03 MED ORDER — CEFAZOLIN SODIUM-DEXTROSE 2-4 GM/100ML-% IV SOLN
2.0000 g | INTRAVENOUS | Status: AC
  Administered 2017-04-03: 2 g via INTRAVENOUS
  Filled 2017-04-03: qty 100

## 2017-04-03 MED ORDER — SODIUM CHLORIDE 0.9 % IJ SOLN
INTRAMUSCULAR | Status: AC
  Filled 2017-04-03: qty 20

## 2017-04-03 MED ORDER — PROMETHAZINE HCL 25 MG/ML IJ SOLN: 6.2500 mg | INTRAMUSCULAR | Status: DC | PRN

## 2017-04-03 MED ORDER — DIPHENHYDRAMINE HCL 50 MG/ML IJ SOLN
INTRAMUSCULAR | Status: DC | PRN
  Administered 2017-04-03: 25 mg via INTRAVENOUS

## 2017-04-03 MED ORDER — NICOTINE 14 MG/24HR TD PT24
14.0000 mg | MEDICATED_PATCH | Freq: Every day | TRANSDERMAL | Status: DC
  Administered 2017-04-03: 14 mg via TRANSDERMAL
  Filled 2017-04-03 (×4): qty 1

## 2017-04-03 MED ORDER — ACETAMINOPHEN 325 MG PO TABS
650.0000 mg | ORAL_TABLET | ORAL | Status: DC | PRN
  Administered 2017-04-04: 650 mg via ORAL
  Filled 2017-04-03: qty 2

## 2017-04-03 MED ORDER — MORPHINE SULFATE (PF) 0.5 MG/ML IJ SOLN
INTRAMUSCULAR | Status: DC | PRN
  Administered 2017-04-03: .2 mg via INTRATHECAL

## 2017-04-03 MED ORDER — DEXAMETHASONE SODIUM PHOSPHATE 10 MG/ML IJ SOLN
INTRAMUSCULAR | Status: DC | PRN
Start: 2017-04-03 — End: 2017-04-03
  Administered 2017-04-03: 10 mg via INTRAVENOUS

## 2017-04-03 MED ORDER — SIMETHICONE 80 MG PO CHEW: 80.0000 mg | CHEWABLE_TABLET | ORAL | Status: DC | PRN

## 2017-04-03 MED ORDER — DEXAMETHASONE SODIUM PHOSPHATE 10 MG/ML IJ SOLN
INTRAMUSCULAR | Status: AC
  Filled 2017-04-03: qty 1

## 2017-04-03 MED ORDER — SIMETHICONE 80 MG PO CHEW
80.0000 mg | CHEWABLE_TABLET | ORAL | Status: DC
  Administered 2017-04-04 (×2): 80 mg via ORAL
  Filled 2017-04-03 (×2): qty 1

## 2017-04-03 MED ORDER — PHENYLEPHRINE 8 MG IN D5W 100 ML (0.08MG/ML) PREMIX OPTIME
INJECTION | INTRAVENOUS | Status: DC | PRN
  Administered 2017-04-03: 60 ug/min via INTRAVENOUS

## 2017-04-03 MED ORDER — PRENATAL MULTIVITAMIN CH
1.0000 | ORAL_TABLET | Freq: Every day | ORAL | Status: DC
  Administered 2017-04-04 – 2017-04-05 (×2): 1 via ORAL
  Filled 2017-04-03 (×2): qty 1

## 2017-04-03 MED ORDER — NALOXONE HCL 4 MG/10ML IJ SOLN: 1.0000 ug/kg/h | INTRAVENOUS | Status: DC | PRN

## 2017-04-03 MED ORDER — TETANUS-DIPHTH-ACELL PERTUSSIS 5-2.5-18.5 LF-MCG/0.5 IM SUSP: 0.5000 mL | Freq: Once | INTRAMUSCULAR | Status: DC

## 2017-04-03 SURGICAL SUPPLY — 44 items
APL SKNCLS STERI-STRIP NONHPOA (GAUZE/BANDAGES/DRESSINGS) ×1
BENZOIN TINCTURE PRP APPL 2/3 (GAUZE/BANDAGES/DRESSINGS) ×3 IMPLANT
CHLORAPREP W/TINT 26ML (MISCELLANEOUS) ×3 IMPLANT
CLAMP CORD UMBIL (MISCELLANEOUS) IMPLANT
CLOSURE WOUND 1/2 X4 (GAUZE/BANDAGES/DRESSINGS) ×1
CLOTH BEACON ORANGE TIMEOUT ST (SAFETY) ×3 IMPLANT
DRAPE C SECTION CLR SCREEN (DRAPES) IMPLANT
DRSG OPSITE POSTOP 4X10 (GAUZE/BANDAGES/DRESSINGS) ×3 IMPLANT
ELECT REM PT RETURN 9FT ADLT (ELECTROSURGICAL) ×3
ELECTRODE REM PT RTRN 9FT ADLT (ELECTROSURGICAL) ×1 IMPLANT
EXTRACTOR VACUUM M CUP 4 TUBE (SUCTIONS) IMPLANT
EXTRACTOR VACUUM M CUP 4' TUBE (SUCTIONS)
GAUZE SPONGE 4X4 12PLY STRL LF (GAUZE/BANDAGES/DRESSINGS) ×4 IMPLANT
GLOVE BIO SURGEON STRL SZ7.5 (GLOVE) ×3 IMPLANT
GLOVE BIOGEL PI IND STRL 7.0 (GLOVE) ×1 IMPLANT
GLOVE BIOGEL PI INDICATOR 7.0 (GLOVE) ×2
GOWN STRL REUS W/TWL 2XL LVL3 (GOWN DISPOSABLE) ×3 IMPLANT
GOWN STRL REUS W/TWL LRG LVL3 (GOWN DISPOSABLE) ×6 IMPLANT
KIT ABG SYR 3ML LUER SLIP (SYRINGE) IMPLANT
NDL HYPO 25X5/8 SAFETYGLIDE (NEEDLE) IMPLANT
NEEDLE HYPO 22GX1.5 SAFETY (NEEDLE) ×3 IMPLANT
NEEDLE HYPO 25X5/8 SAFETYGLIDE (NEEDLE) IMPLANT
NS IRRIG 1000ML POUR BTL (IV SOLUTION) ×3 IMPLANT
PACK C SECTION WH (CUSTOM PROCEDURE TRAY) ×3 IMPLANT
PAD ABD 7.5X8 STRL (GAUZE/BANDAGES/DRESSINGS) ×2 IMPLANT
PAD OB MATERNITY 4.3X12.25 (PERSONAL CARE ITEMS) ×3 IMPLANT
PENCIL SMOKE EVAC W/HOLSTER (ELECTROSURGICAL) ×3 IMPLANT
RTRCTR C-SECT PINK 25CM LRG (MISCELLANEOUS) ×3 IMPLANT
SPONGE LAP 18X18 X RAY DECT (DISPOSABLE) ×2 IMPLANT
STRIP CLOSURE SKIN 1/2X4 (GAUZE/BANDAGES/DRESSINGS) ×2 IMPLANT
SUT CHROMIC 1 CTX 36 (SUTURE) ×8 IMPLANT
SUT VIC AB 1 CT1 27 (SUTURE) ×6
SUT VIC AB 1 CT1 27XBRD ANTBC (SUTURE) ×2 IMPLANT
SUT VIC AB 2-0 CT1 (SUTURE) ×3 IMPLANT
SUT VIC AB 2-0 CT1 27 (SUTURE) ×3
SUT VIC AB 2-0 CT1 TAPERPNT 27 (SUTURE) ×1 IMPLANT
SUT VIC AB 3-0 CT1 27 (SUTURE) ×6
SUT VIC AB 3-0 CT1 TAPERPNT 27 (SUTURE) ×2 IMPLANT
SUT VIC AB 3-0 SH 27 (SUTURE)
SUT VIC AB 3-0 SH 27X BRD (SUTURE) IMPLANT
SUT VIC AB 4-0 KS 27 (SUTURE) ×3 IMPLANT
SYR BULB IRRIGATION 50ML (SYRINGE) ×2 IMPLANT
TOWEL OR 17X24 6PK STRL BLUE (TOWEL DISPOSABLE) ×3 IMPLANT
TRAY FOLEY BAG SILVER LF 14FR (SET/KITS/TRAYS/PACK) ×3 IMPLANT

## 2017-04-03 NOTE — Anesthesia Preprocedure Evaluation (Addendum)
Anesthesia Evaluation  Patient identified by MRN, date of birth, ID band Patient awake  General Assessment Comment:Morphine allergy: patient states she had a rash on her abdomen, resolved with benadryl. Denied itching, SOB.  Reviewed: Allergy & Precautions, NPO status , Patient's Chart, lab work & pertinent test results  Airway Mallampati: II  TM Distance: >3 FB Neck ROM: Full    Dental  (+) Teeth Intact, Dental Advisory Given   Pulmonary Current Smoker,    Pulmonary exam normal breath sounds clear to auscultation       Cardiovascular negative cardio ROS Normal cardiovascular exam Rhythm:Regular Rate:Normal     Neuro/Psych PSYCHIATRIC DISORDERS Anxiety Depression negative neurological ROS     GI/Hepatic negative GI ROS, Neg liver ROS,   Endo/Other  negative endocrine ROSObesity   Renal/GU negative Renal ROS     Musculoskeletal negative musculoskeletal ROS (+)   Abdominal   Peds  Hematology  (+) Blood dyscrasia, anemia , Plt 180k   Anesthesia Other Findings Day of surgery medications reviewed with the patient.  Reproductive/Obstetrics (+) Pregnancy H/o C-section x1                            Anesthesia Physical Anesthesia Plan  ASA: II  Anesthesia Plan: Spinal   Post-op Pain Management:    Induction:   PONV Risk Score and Plan: 1 and Treatment may vary due to age or medical condition, Scopolamine patch - Pre-op, Dexamethasone and Ondansetron  Airway Management Planned:   Additional Equipment:   Intra-op Plan:   Post-operative Plan:   Informed Consent: I have reviewed the patients History and Physical, chart, labs and discussed the procedure including the risks, benefits and alternatives for the proposed anesthesia with the patient or authorized representative who has indicated his/her understanding and acceptance.   Dental advisory given  Plan Discussed with: CRNA,  Anesthesiologist and Surgeon  Anesthesia Plan Comments: (Discussed purported morphine allergy with patient in pre-op. She states she got a small rash on her abdomen with her last C-section after receiving duramorph.  Denied itching, difficulty breathing.  States she was given some benadryl and rash improved.  No other sequelae noted.  Discussed risks, benefits, alternatives to using duramorph in her spinal today, and patient states she would like to receive duramorph for post-op analgesia.  Given mild reaction previously, patient accepts that benefits outweigh risks. )       Anesthesia Quick Evaluation

## 2017-04-03 NOTE — Addendum Note (Signed)
Addendum  created 04/03/17 1819 by Shanon PayorGregory, Josseline Reddin M, CRNA   Sign clinical note

## 2017-04-03 NOTE — Anesthesia Procedure Notes (Signed)
Spinal  Patient location during procedure: OR Start time: 04/03/2017 11:28 AM End time: 04/03/2017 11:30 AM Staffing Anesthesiologist: Cecile Hearingurk, Stephen Edward, MD Performed: anesthesiologist  Preanesthetic Checklist Completed: patient identified, surgical consent, pre-op evaluation, timeout performed, IV checked, risks and benefits discussed and monitors and equipment checked Spinal Block Patient position: sitting Prep: site prepped and draped and DuraPrep Patient monitoring: continuous pulse ox and blood pressure Approach: midline Location: L3-4 Injection technique: single-shot Needle Needle type: Pencan  Needle gauge: 24 G Assessment Sensory level: T4 Additional Notes Functioning IV was confirmed and monitors were applied. Sterile prep and drape, including hand hygiene, mask and sterile gloves were used. The patient was positioned and the spine was prepped. The skin was anesthetized with lidocaine.  Free flow of clear CSF was obtained prior to injecting local anesthetic into the CSF.  The spinal needle aspirated freely following injection.  The needle was carefully withdrawn.  The patient tolerated the procedure well. Consent was obtained prior to procedure with all questions answered and concerns addressed. Risks including but not limited to bleeding, infection, nerve damage, paralysis, failed block, inadequate analgesia, allergic reaction, high spinal, itching and headache were discussed and the patient wished to proceed.   Arrie AranStephen Turk, MD

## 2017-04-03 NOTE — Progress Notes (Signed)
Patient ID: Holly PickettStephanie M Sproles, female   DOB: 04/02/1980, 37 y.o.   MRN: [id number] The risks of cesarean section discussed with the patient included but were not limited to: bleeding which may require transfusion or reoperation; infection which may require antibiotics; injury to bowel, bladder, ureters or other surrounding organs; injury to the fetus; need for additional procedures including hysterectomy in the event of a life-threatening hemorrhage; placental abnormalities wth subsequent pregnancies, incisional problems, thromboembolic phenomenon and other postoperative/anesthesia complications. The patient concurred with the proposed plan, giving informed written consent for the procedure.   Anesthesia and OR aware. Preoperative prophylactic antibiotics and SCDs ordered on call to the OR.  To OR when ready.  Louana Fontenot L. Alysia PennaErvin, MD

## 2017-04-03 NOTE — Op Note (Signed)
Cesarean Section Procedure Note  04/03/2017  12:48 PM  PATIENT:  Holly Gordon  37 y.o. female  PRE-OPERATIVE DIAGNOSIS:  repeat cesarean section   POST-OPERATIVE DIAGNOSIS:  repeat cesarean section, viable female infant, declines trial of labor after cesarean   PROCEDURE:  Procedure(s): CESAREAN SECTION (N/A)  SURGEON:  Surgeon(s) and Role:    Hermina StaggersErvin, Michael L, MD - Primary    Rolm BookbinderAmber Shabreka Coulon, DO- Fellow  ANESTHESIA:   spinal  EBL:  Total I/O In: 2400 [I.V.:2400] Out: 514 [Urine:75; Blood:439]  BLOOD ADMINISTERED:none  DRAINS: none   LOCAL MEDICATIONS USED:  NONE   Procedure Details   The patient was seen in the Holding Room. The risks, benefits, complications, treatment options, and expected outcomes were discussed with the patient.  The patient concurred with the proposed plan, giving informed consent.  The site of surgery properly noted/marked. The patient was taken to Operating Room # 9, identified as Holly Gordon and the procedure verified as C-Section Delivery. A Time Out was held and the above information confirmed.  After induction of anesthesia, the patient was draped and prepped in the usual sterile manner. A Pfannenstiel incision was made and carried down through the subcutaneous tissue to the fascia. Fascial incision was made and extended transversely. The fascia was separated from the underlying rectus tissue superiorly and inferiorly. The peritoneum was identified and entered. Peritoneal incision was extended longitudinally. The utero-vesical peritoneal reflection was incised transversely and the bladder flap was bluntly freed from the lower uterine segment. A low transverse uterine incision was made. Delivered from cephalic presentation was a Female with Apgar scores of 9 at one minute and 9 at five minutes. After the umbilical cord was clamped and cut cord blood was obtained for evaluation. The placenta was removed intact and appeared normal. The uterine  outline, tubes and ovaries appeared normal. The uterine incision was closed with running locked sutures of 3-0 Chromic. Hemostasis was observed. Lavage was carried out until clear. The fascia was then reapproximated with running sutures of 3-0 Vicryl. The skin was reapproximated with 4-0 Monocryl.  Instrument, sponge, and needle counts were correct prior the abdominal closure and at the conclusion of the case.   Complications:  None; patient tolerated the procedure well.  COUNTS:  YES   PATIENT DISPOSITION:  PACU - hemodynamically stable.   Delay start of Pharmacological VTE agent (>24hrs) due to surgical blood loss or risk of bleeding: no             Disposition: PACU - hemodynamically stable.         Condition: stable   Rolm BookbinderMoss, Aaliyah Gavel, DO 04/03/2017 12:48 PM

## 2017-04-03 NOTE — Transfer of Care (Signed)
Immediate Anesthesia Transfer of Care Note  Patient: Holly Gordon  Procedure(s) Performed: CESAREAN SECTION (N/A )  Patient Location: PACU  Anesthesia Type:Spinal  Level of Consciousness: awake, alert  and oriented  Airway & Oxygen Therapy: Patient Spontanous Breathing  Post-op Assessment: Report given to RN and Post -op Vital signs reviewed and stable  Post vital signs: Reviewed and stable  Last Vitals:  Vitals:   04/03/17 0950 04/03/17 1252  BP: (!) 128/91 136/66  Pulse: 87 69  Resp: 18 18  Temp: 36.6 C (!) 36.3 C  SpO2:  100%    Last Pain:  Vitals:   04/03/17 1252  TempSrc: Oral         Complications: No apparent anesthesia complications

## 2017-04-03 NOTE — H&P (Signed)
Obstetric Preoperative History and Physical  Holly Gordon is a 37 y.o. [id number] with IUP at [redacted]w[redacted]d presenting for repeat scheduled cesarean section.  First c-section due to transverse fetal lie. She does smoke cigarettes and requests postpartum nicotine patch. No acute concerns.   Prenatal Course Source of Care: [facility] Pregnancy complications or risks: Patient Active Problem List   Diagnosis Date Noted  . Marijuana abuse 09/18/2016  . Cervical dysplasia 09/15/2016  . Supervision of other normal pregnancy, antepartum 09/09/2016  . History of cesarean delivery affecting pregnancy 09/09/2016  . Obesity in pregnancy, antepartum 09/09/2016  . Body mass index (BMI) of 25.0 to 25.9 in adult 09/09/2016  . History of substance abuse 09/09/2016  . Tobacco abuse 09/09/2016  . History of depression 09/09/2016  . Imprisonment and other incarceration 09/09/2016   She plans to bottle feed She desires no method for postpartum contraception.   Prenatal labs and studies: ABO, Rh: --/--/O POS (01/31 1030) Antibody: NEG (01/31 1030) Rubella: 4.19 (07/10 0944) RPR: Non Reactive (01/31 1030)  HBsAg: Negative (07/10 0944)  HIV: Non Reactive (11/20 0815)  GBS: negative 2 hr Glucola  70/133/92 Genetic screening normal Anatomy US normal  Prenatal Transfer Tool  Maternal Diabetes: No Genetic Screening: Normal Maternal Ultrasounds/Referrals: Normal Fetal Ultrasounds or other Referrals:  None Maternal Substance Abuse:  Yes:  Type: Smoker Significant Maternal Medications:  None Significant Maternal Lab Results: Lab values include: Group B Strep negative  Past Medical History:  Diagnosis Date  . Anemia   . Anxiety   . Bacterial vaginitis 08/26/2016  . Depression    had counseling in the passt no meds  . History of abnormal cervical Pap smear   . Vaginal Pap smear, abnormal     Past Surgical History:  Procedure Laterality Date  . CESAREAN SECTION  2008   x1  . COLPOSCOPY     abnormal pap   . INDUCED ABORTION    . TONSILLECTOMY    . WISDOM TOOTH EXTRACTION     x4    OB History  Gravida Para Term Preterm AB Living  6 2 2  0 3 1  SAB TAB Ectopic Multiple Live Births  2 1 0 0 2    # Outcome Date GA Lbr Len/2nd Weight Sex Delivery Anes PTL Lv  6 Current           5 Term 2008    F CS-Unspec   DEC  4 Term 2007    M Vag-Spont   LIV  3 SAB      SAB     2 TAB      TAB     1 SAB      SAB         Social History   Socioeconomic History  . Marital status: Single    Spouse name: None  . Number of children: None  . Years of education: None  . Highest education level: None  Social Needs  . Financial resource strain: None  . Food insecurity - worry: None  . Food insecurity - inability: None  . Transportation needs - medical: None  . Transportation needs - non-medical: None  Occupational History  . None  Tobacco Use  . Smoking status: Current Every Day Smoker    Packs/day: 0.10    Types: Cigarettes  . Smokeless tobacco: Never Used  Substance and Sexual Activity  . Alcohol use: No  . Drug use: No    Comment: uses vape a month ago  .  Sexual activity: Yes    Partners: Male    Birth control/protection: None  Other Topics Concern  . None  Social History Narrative  . None    Family History  Problem Relation Age of Onset  . Hypertension Father   . Diabetes Father   . Kidney disease Maternal Grandmother   . Heart disease Maternal Grandmother   . Alzheimer's disease Maternal Grandfather     Medications Prior to Admission  Medication Sig Dispense Refill Last Dose  . acetaminophen (TYLENOL) 500 MG tablet Take 500 mg by mouth daily as needed for mild pain.    Past Month at Unknown time  . diphenhydrAMINE (BENADRYL) 25 MG tablet Take 25 mg by mouth 2 (two) times daily as needed for allergies.   Past Week at Unknown time  . ranitidine (ZANTAC) 150 MG capsule Take 1 capsule (150 mg total) by mouth 2 (two) times daily. (Patient taking differently: Take 150 mg by  mouth 2 (two) times daily as needed for heartburn. ) 60 capsule 1 04/03/2017 at Unknown time  . amoxicillin-clavulanate (AUGMENTIN) 875-125 MG tablet Take 1 tablet by mouth 2 (two) times daily. (Patient not taking: Reported on 03/18/2017) 14 tablet 0 Not Taking  . benzonatate (TESSALON) 100 MG capsule Take 1 capsule (100 mg total) by mouth every 8 (eight) hours. (Patient not taking: Reported on 03/18/2017) 21 capsule 0 Not Taking  . Prenatal Vit-Fe Fumarate-FA (PRENATAL MULTIVITAMIN) TABS tablet Take 1 tablet by mouth daily at 12 noon.   Unknown at Unknown time  . triamcinolone ointment (KENALOG) 0.5 % Apply 1 application topically 2 (two) times daily. (Patient not taking: Reported on 03/25/2017) 30 g 6 Unknown at Unknown time    Allergies  Allergen Reactions  . Morphine And Related Shortness Of Breath and Rash    Stopped breathing  . Sulfa Antibiotics Rash    Review of Systems: Negative except for what is mentioned in HPI.  Physical Exam: BP (!) 128/91   Pulse 87   Temp 97.9 F (36.6 C) (Oral)   Resp 18   Ht 5\' 4"  (1.626 m)   Wt 190 lb 9.6 oz (86.5 kg)   LMP 07/04/2016 (Approximate)   BMI 32.72 kg/m  CONSTITUTIONAL: Well-developed, well-nourished female in no acute distress.  HENT:  Normocephalic, atraumatic. Oropharynx is clear and moist EYES: Conjunctivae and EOM are normal. No scleral icterus.  NECK: Normal range of motion, supple SKIN: Skin is warm and dry. No rash noted. Not diaphoretic. No erythema. NEUROLGIC: Alert and oriented to person, place, and time. Normal reflexes, muscle tone coordination. No cranial nerve deficit noted. PSYCHIATRIC: Normal mood and affect. Normal behavior. CARDIOVASCULAR: Normal heart rate noted, regular rhythm RESPIRATORY: Effort and breath sounds normal, no problems with respiration noted ABDOMEN: Soft, nontender, nondistended, gravid. Well-healed Pfannenstiel incision. PELVIC: Deferred MUSCULOSKELETAL: Normal range of motion. No edema and no  tenderness. 2+ distal pulses.   Pertinent Labs/Studies:   Results for orders placed or performed during the hospital encounter of 04/02/17 (from the past 72 hour(s))  CBC     Status: Abnormal   Collection Time: 04/02/17 10:30 AM  Result Value Ref Range   WBC 16.0 (H) 4.0 - 10.5 K/uL   RBC 4.46 3.87 - 5.11 MIL/uL   Hemoglobin 13.9 12.0 - 15.0 g/dL   HCT 16.140.3 09.636.0 - 04.546.0 %   MCV 90.4 78.0 - 100.0 fL   MCH 31.2 26.0 - 34.0 pg   MCHC 34.5 30.0 - 36.0 g/dL   RDW 40.913.4 81.111.5 -  15.5 %   Platelets 180 150 - 400 K/uL  RPR     Status: None   Collection Time: 04/02/17 10:30 AM  Result Value Ref Range   RPR Ser Ql Non Reactive Non Reactive    Comment: (NOTE) Performed At: [facility] [address] [place], [place] [id number] Jolene Schimke MD WU:[id number]   Type and screen     Status: None   Collection Time: 04/02/17 10:30 AM  Result Value Ref Range   ABO/RH(D) O POS    Antibody Screen NEG    Sample Expiration 04/05/2017     Assessment and Plan :KALEIAH KUTZER is a 37 y.o. [id number] at [redacted]w[redacted]d being admitted for repeat scheduled cesarean section. The risks of cesarean section discussed with the patient included but were not limited to: bleeding which may require transfusion or reoperation; infection which may require antibiotics; injury to bowel, bladder, ureters or other surrounding organs; injury to the fetus; need for additional procedures including hysterectomy in the event of a life-threatening hemorrhage; placental abnormalities wth subsequent pregnancies, incisional problems, thromboembolic phenomenon and other postoperative/anesthesia complications. The patient concurred with the proposed plan, giving informed written consent for the procedure. Patient has been NPO since last night she will remain NPO for procedure. Anesthesia and OR aware. Preoperative prophylactic antibiotics and SCDs ordered on call to the OR. To OR when ready.    Rolm Bookbinder, DO OB Fellow Faculty  Practice, [facility]
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
## 2017-04-03 NOTE — Anesthesia Postprocedure Evaluation (Signed)
Anesthesia Post Note  Patient: Holly Gordon  Procedure(s) Performed: CESAREAN SECTION (N/A )     Patient location during evaluation: PACU Anesthesia Type: Spinal Level of consciousness: oriented and awake and alert Pain management: pain level controlled Vital Signs Assessment: post-procedure vital signs reviewed and stable Respiratory status: spontaneous breathing, respiratory function stable and patient connected to nasal cannula oxygen Cardiovascular status: blood pressure returned to baseline and stable Postop Assessment: no headache, no backache, no apparent nausea or vomiting, spinal receding and patient able to bend at knees Anesthetic complications: no Comments: NO REACTION TO DURAMORPH NOTED IN PACU. Patient satisfied with analgesic therapy.    Last Vitals:  Vitals:   04/03/17 1345 04/03/17 1404  BP: 124/76 139/89  Pulse: 76 69  Resp: 20 20  Temp: (!) 36.3 C (!) 36.3 C  SpO2: 100% 100%    Last Pain:  Vitals:   04/03/17 1404  TempSrc: Axillary   Pain Goal:                 Cecile HearingStephen Edward Tyus Kallam

## 2017-04-03 NOTE — Anesthesia Postprocedure Evaluation (Signed)
Anesthesia Post Note  Patient: Holly Gordon  Procedure(s) Performed: CESAREAN SECTION (N/A )     Patient location during evaluation: Mother Baby Anesthesia Type: Spinal Level of consciousness: awake and alert and oriented Pain management: satisfactory to patient Vital Signs Assessment: post-procedure vital signs reviewed and stable Respiratory status: spontaneous breathing and nonlabored ventilation Cardiovascular status: stable Postop Assessment: no headache, no backache, patient able to bend at knees, no signs of nausea or vomiting and adequate PO intake Anesthetic complications: no    Last Vitals:  Vitals:   04/03/17 1702 04/03/17 1703  BP:    Pulse:    Resp:    Temp:    SpO2: 99% 99%    Last Pain:  Vitals:   04/03/17 1700  TempSrc: Axillary  PainSc: 3    Pain Goal: Patients Stated Pain Goal: 3 (04/03/17 1700)               Madison HickmanGREGORY,Brylyn Novakovich

## 2017-04-04 LAB — CBC
HCT: 32.1 % — ABNORMAL LOW (ref 36.0–46.0)
HEMOGLOBIN: 11 g/dL — AB (ref 12.0–15.0)
MCH: 30.6 pg (ref 26.0–34.0)
MCHC: 34.3 g/dL (ref 30.0–36.0)
MCV: 89.2 fL (ref 78.0–100.0)
Platelets: 170 10*3/uL (ref 150–400)
RBC: 3.6 MIL/uL — AB (ref 3.87–5.11)
RDW: 13.2 % (ref 11.5–15.5)
WBC: 18.8 10*3/uL — ABNORMAL HIGH (ref 4.0–10.5)

## 2017-04-04 MED ORDER — FAMOTIDINE 20 MG PO TABS: 10.0000 mg | ORAL_TABLET | Freq: Two times a day (BID) | ORAL | Status: DC

## 2017-04-04 MED ORDER — FAMOTIDINE 20 MG PO TABS
10.0000 mg | ORAL_TABLET | Freq: Two times a day (BID) | ORAL | Status: DC
  Administered 2017-04-04 – 2017-04-05 (×3): 10 mg via ORAL
  Filled 2017-04-04 (×3): qty 1

## 2017-04-04 NOTE — Progress Notes (Signed)
POSTPARTUM PROGRESS NOTE  Post Partum Day 1  Subjective:  Larene PickettStephanie M Loretto is a 37 y.o. [id number] s/p rLTCS at 3152w0d.  No acute events overnight.  Pt denies problems with ambulating, voiding or po intake.  She denies nausea or vomiting.  Pain is well controlled.  She has had flatus. She has not had bowel movement.  Lochia Minimal.   Objective: Blood pressure 113/63, pulse 67, temperature 98.1 F (36.7 C), temperature source Oral, resp. rate 18, height 5\' 4"  (1.626 m), weight 86.5 kg (190 lb 9.6 oz), last menstrual period 07/04/2016, SpO2 97 %, unknown if currently breastfeeding.  Physical Exam:  General: alert, cooperative and no distress Chest: no respiratory distress Heart:regular rate, distal pulses intact Abdomen: soft, nontender,  Uterine Fundus: firm, appropriately tender DVT Evaluation: No calf swelling or tenderness Extremities: trace edema Skin: warm, dry; incision clean/dry/intact  Recent Labs    04/03/17 1503 04/04/17 0505  HGB 13.4 11.0*  HCT 39.4 32.1*    Assessment/Plan: Larene PickettStephanie M Mastrogiovanni is a 37 y.o. [id number] s/p rLTCS at 2552w0d   PPD#1 - Doing well, continue expectant management Contraception: undecided Feeding: bottle Dispo: Plan for discharge tomorrow.   LOS: 1 day   Alroy BailiffParker W Leland MD 04/04/2017, 6:22 AM   I confirm that I have verified the information documented in the resident's note and that I have also personally reperformed the physical exam and all medical decision making activities.  Patient is refusing lovenox shot; she does not like needles and does not want the shot. She insists that she will ambulate instead of using lovenox. I encouraged her to consider the shot since she just had surgery but she refused.   Luna KitchensKathryn Armond Cuthrell CNM

## 2017-04-05 MED ORDER — OXYCODONE HCL 5 MG PO TABS: 5.0000 mg | ORAL_TABLET | Freq: Four times a day (QID) | ORAL | 0 refills | Status: DC | PRN

## 2017-04-05 MED ORDER — SENNOSIDES-DOCUSATE SODIUM 8.6-50 MG PO TABS: 2.0000 | ORAL_TABLET | ORAL | 0 refills | Status: DC

## 2017-04-05 MED ORDER — IBUPROFEN 800 MG PO TABS: 800.0000 mg | ORAL_TABLET | Freq: Four times a day (QID) | ORAL | 0 refills | Status: DC

## 2017-04-05 NOTE — Discharge Summary (Signed)
OB Discharge Summary     Patient Name: Holly PickettStephanie M Gordon DOB: 09/17/1980 MRN: [id number]  Date of admission: 04/03/2017 Delivering MD: Hermina StaggersERVIN, MICHAEL L   Date of discharge: 04/05/2017  Admitting diagnosis: RCS Intrauterine pregnancy: 1612w0d     Secondary diagnosis:  Active Problems:   Status post C-section  Additional problems: tobacco abuse, h/o marijuana abuse  Patient Active Problem List   Diagnosis Date Noted  . Status post C-section 04/03/2017  . Marijuana abuse 09/18/2016  . Cervical dysplasia 09/15/2016  . Supervision of other normal pregnancy, antepartum 09/09/2016  . History of cesarean delivery affecting pregnancy 09/09/2016  . Obesity in pregnancy, antepartum 09/09/2016  . Body mass index (BMI) of 25.0 to 25.9 in adult 09/09/2016  . History of substance abuse 09/09/2016  . Tobacco abuse 09/09/2016  . History of depression 09/09/2016  . Imprisonment and other incarceration 09/09/2016      Discharge diagnosis: Term Pregnancy Delivered                                                                                                Post partum procedures:none  Augmentation: scheduled c-section  Complications: None  Hospital course:  Sceduled C/S   37 y.o. yo [id number] at 8012w0d was admitted to the hospital 04/03/2017 for scheduled cesarean section with the following indication:Prior c-section.  Membrane Rupture Time/Date: 11:52 AM ,04/03/2017   Patient delivered a Viable infant.04/03/2017  Details of operation can be found in separate operative note.  Pateint had an uncomplicated postpartum course.  She is ambulating, tolerating a regular diet, passing flatus, and urinating well. Patient is discharged home in stable condition on  04/05/17         Physical exam  Vitals:   04/04/17 0525 04/04/17 0937 04/04/17 1806 04/05/17 0500  BP: 113/63 105/65 (!) 104/59 124/86  Pulse: 67 64 77 67  Resp: 18 18 18 18   Temp: 98.1 F (36.7 C) 98 F (36.7 C) 98.3 F (36.8 C) 98.4 F  (36.9 C)  TempSrc: Oral Oral Oral Oral  SpO2: 97% 98%    Weight:      Height:       General: alert, cooperative and no distress Lochia: appropriate Uterine Fundus: firm Incision: Healing well with no significant drainage, Dressing is clean, dry, and intact DVT Evaluation: No evidence of DVT seen on physical exam. Labs: Lab Results  Component Value Date   WBC 18.8 (H) 04/04/2017   HGB 11.0 (L) 04/04/2017   HCT 32.1 (L) 04/04/2017   MCV 89.2 04/04/2017   PLT 170 04/04/2017   CMP Latest Ref Rng & Units 04/03/2017  Glucose 65 - 99 mg/dL -  BUN 6 - 20 mg/dL -  Creatinine 1.300.44 - 8.651.00 mg/dL 7.840.59  Sodium 696134 - 295144 mmol/L -  Potassium 3.5 - 5.2 mmol/L -  Chloride 96 - 106 mmol/L -  CO2 20 - 29 mmol/L -  Calcium 8.7 - 10.2 mg/dL -  Total Protein 6.0 - 8.5 g/dL -  Total Bilirubin 0.0 - 1.2 mg/dL -  Alkaline Phos 39 - 284117 IU/L -  AST 0 - 40  IU/L -  ALT 0 - 32 IU/L -    Discharge instruction: per After Visit Summary and "Baby and Me Booklet".  After visit meds:  Allergies as of 04/05/2017      Reactions   Morphine And Related Rash   Received duramorph 04/03/17; tolerated   Sulfa Antibiotics Rash      Medication List    STOP taking these medications   acetaminophen 500 MG tablet Commonly known as:  TYLENOL   amoxicillin-clavulanate 875-125 MG tablet Commonly known as:  AUGMENTIN   benzonatate 100 MG capsule Commonly known as:  TESSALON   diphenhydrAMINE 25 MG tablet Commonly known as:  BENADRYL   triamcinolone ointment 0.5 % Commonly known as:  KENALOG     TAKE these medications   ibuprofen 800 MG tablet Commonly known as:  ADVIL,MOTRIN Take 1 tablet (800 mg total) by mouth every 6 (six) hours.   oxyCODONE 5 MG immediate release tablet Commonly known as:  Oxy IR/ROXICODONE Take 1 tablet (5 mg total) by mouth every 6 (six) hours as needed for moderate pain.   prenatal multivitamin Tabs tablet Take 1 tablet by mouth daily at 12 noon.   ranitidine 150 MG  capsule Commonly known as:  ZANTAC Take 1 capsule (150 mg total) by mouth 2 (two) times daily. What changed:    when to take this  reasons to take this   senna-docusate 8.6-50 MG tablet Commonly known as:  Senokot-S Take 2 tablets by mouth daily. Start taking on:  04/06/2017       Diet: routine diet  Activity: Advance as tolerated. Pelvic rest for 6 weeks.   Outpatient follow up:2 weeks Follow up Appt: Future Appointments  Date Time Provider Department Center  04/15/2017  3:00 PM [facility] NURSE [facility]  05/13/2017  3:00 PM Marylene Land, CNM [facility]   Follow up Visit:No Follow-up on file. Follow-up Information    [facility]. Schedule an appointment as soon as possible for a visit in 2 week(s).   Why:  Please follow up in 2 weeks for incision check Contact information: [address] [place] [address] [phone]          Postpartum contraception: IUD  Newborn Data: Live born female  Birth Weight: 5 lb 12.4 oz (2620 g) APGAR: 9, 9  Newborn Delivery   Birth date/time:  04/03/2017 11:53:00 Delivery type:  C-Section, Low Transverse C-section categorization:  Repeat     Baby Feeding: Bottle Disposition:home with mother   Caryl Ada, DO OB Fellow Center for [facility], [facility]

## 2017-04-05 NOTE — Progress Notes (Addendum)
Rn went into room to give medicine. Mom stated baby has been up all night. Mom stated I have not gotten any "damn sleep". Mom stated she had to hold the baby all night.. Mom has had no support person for two nights. Mom had mentioned prior that baby had a high pitch cry. Rn is concerned about mom having assistance when she goes home.   The Rn took baby to nursery at 0500 am so mom could rest. Mom agreed.

## 2017-04-05 NOTE — Discharge Instructions (Signed)
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
## 2017-04-07 ENCOUNTER — Ambulatory Visit (INDEPENDENT_AMBULATORY_CARE_PROVIDER_SITE_OTHER): Payer: Medicaid Other

## 2017-04-07 VITALS — BP 112/78 | HR 72

## 2017-04-07 DIAGNOSIS — Z9889 Other specified postprocedural states: Secondary | ICD-10-CM

## 2017-04-07 DIAGNOSIS — Z5189 Encounter for other specified aftercare: Secondary | ICD-10-CM

## 2017-04-08 NOTE — Progress Notes (Signed)
Subjective:     Holly Gordon is a 37 y.o. female who presents to the clinic one week status post  for c-section. Eating a regular diet without difficulty. Bowel movements are normal.    Review of Systems   Objective:    BP 112/78   Pulse 72  General:  Normal appearing   Abdomen: active, bowel sounds active, non-tender  Incision:  incision:no dehiscence,incision well approximated,healing well no drainage,no erythema,no hernia,no seroma,no swelling     Assessment:    Doing well   Plan:    1. Continue any current medications. 2. Wound care discussed. 3. Activity restrictions: none  4. Anticipated return to work: not currently working 5. Follow up: PRN  Shaleena Crusoe Emeline DarlingA Sherrica Niehaus, CMA

## 2017-04-15 ENCOUNTER — Other Ambulatory Visit: Payer: Medicaid Other | Admitting: *Deleted

## 2017-04-15 DIAGNOSIS — Z5189 Encounter for other specified aftercare: Secondary | ICD-10-CM

## 2017-04-15 NOTE — Progress Notes (Signed)
Subjective:     Holly PickettStephanie M Vuolo is a 37 y.o. female who presents to the clinic 2 weeks status post cesarean section.  Eating a regular diet without difficulty. Bowel movements are normal. Pain is controlled with current analgesics. Medications being used: acetaminophen and ibuprofen (OTC). Still has oxycodone left over incase she has increased pain.    Objective:   Incision:   healing well, no drainage, no erythema, no hernia, no seroma, no swelling, no dehiscence, incision well approximated     Assessment:    Doing well postoperatively.   Plan:    1. Continue any current medications. 2. Wound care discussed. 3. Activity restrictions: no bending, stooping, or squatting and no lifting more than 5 pounds 4. Anticipated return to work: 4 weeks. 5. Follow up: 4 weeks for postpartum visit.   Janit BernMandy Shiane Wenberg, CMA

## 2017-04-28 ENCOUNTER — Emergency Department
Admission: EM | Admit: 2017-04-28 | Discharge: 2017-04-28 | Disposition: A | Discharge status: Home / Self Care | Payer: Medicaid Other | Attending: Emergency Medicine | Admitting: Emergency Medicine

## 2017-04-28 ENCOUNTER — Other Ambulatory Visit: Payer: Self-pay

## 2017-04-28 ENCOUNTER — Encounter: Payer: Self-pay | Admitting: Emergency Medicine

## 2017-04-28 DIAGNOSIS — J101 Influenza due to other identified influenza virus with other respiratory manifestations: Secondary | ICD-10-CM | POA: Diagnosis not present

## 2017-04-28 DIAGNOSIS — F17210 Nicotine dependence, cigarettes, uncomplicated: Secondary | ICD-10-CM | POA: Diagnosis not present

## 2017-04-28 DIAGNOSIS — Z79899 Other long term (current) drug therapy: Secondary | ICD-10-CM | POA: Diagnosis not present

## 2017-04-28 DIAGNOSIS — R509 Fever, unspecified: Secondary | ICD-10-CM | POA: Diagnosis present

## 2017-04-28 LAB — URINALYSIS, COMPLETE (UACMP) WITH MICROSCOPIC
BILIRUBIN URINE: NEGATIVE
Glucose, UA: NEGATIVE mg/dL
KETONES UR: NEGATIVE mg/dL
LEUKOCYTES UA: NEGATIVE
Nitrite: NEGATIVE
PROTEIN: NEGATIVE mg/dL
Specific Gravity, Urine: 1.015 (ref 1.005–1.030)
pH: 5 (ref 5.0–8.0)

## 2017-04-28 LAB — GROUP A STREP BY PCR: GROUP A STREP BY PCR: NOT DETECTED

## 2017-04-28 LAB — INFLUENZA PANEL BY PCR (TYPE A & B)
INFLBPCR: NEGATIVE
Influenza A By PCR: POSITIVE — AB

## 2017-04-28 MED ORDER — ACETAMINOPHEN 500 MG PO TABS
1000.0000 mg | ORAL_TABLET | Freq: Once | ORAL | Status: AC
  Administered 2017-04-28: 1000 mg via ORAL

## 2017-04-28 MED ORDER — OSELTAMIVIR PHOSPHATE 75 MG PO CAPS: 75.0000 mg | ORAL_CAPSULE | Freq: Two times a day (BID) | ORAL | 0 refills | Status: DC

## 2017-04-28 MED ORDER — IBUPROFEN 800 MG PO TABS
800.0000 mg | ORAL_TABLET | Freq: Once | ORAL | Status: AC
Start: 2017-04-28 — End: 2017-04-28
  Administered 2017-04-28: 800 mg via ORAL
  Filled 2017-04-28: qty 1

## 2017-04-28 MED ORDER — SODIUM CHLORIDE 0.9 % IV BOLUS (SEPSIS)
1000.0000 mL | Freq: Once | INTRAVENOUS | Status: AC
  Administered 2017-04-28: 1000 mL via INTRAVENOUS

## 2017-04-28 MED ORDER — ACETAMINOPHEN 500 MG PO TABS
ORAL_TABLET | ORAL | Status: AC
  Filled 2017-04-28: qty 2

## 2017-04-28 NOTE — ED Triage Notes (Addendum)
States she developed body aches with fever and cough 2 days ago    States she also has had sore throat  cough has been prod at times..vomiting yesterday

## 2017-04-28 NOTE — ED Provider Notes (Signed)
[facility] Emergency Department Provider Note  ____________________________________________   First MD Initiated Contact with Patient 04/28/17 1622     (approximate)  I have reviewed the triage vital signs and the nursing notes.   HISTORY  Chief Complaint Generalized Body Aches    HPI Holly Gordon is a 37 y.o. female presents to the emergency department complaining of fever.  She states she feels like she has the flu.  She has had a cough and congestion with sore throat.  Mucous is clear to brown.  The patient is 3 weeks postpartum.  She had a C-section.  She denies any abdominal pain or weakness  Past Medical History:  Diagnosis Date  . Anemia   . Anxiety   . Bacterial vaginitis 08/26/2016  . Depression    had counseling in the passt no meds  . History of abnormal cervical Pap smear   . Vaginal Pap smear, abnormal     Patient Active Problem List   Diagnosis Date Noted  . Status post C-section 04/03/2017  . Marijuana abuse 09/18/2016  . Cervical dysplasia 09/15/2016  . Supervision of other normal pregnancy, antepartum 09/09/2016  . History of cesarean delivery affecting pregnancy 09/09/2016  . Obesity in pregnancy, antepartum 09/09/2016  . Body mass index (BMI) of 25.0 to 25.9 in adult 09/09/2016  . History of substance abuse 09/09/2016  . Tobacco abuse 09/09/2016  . History of depression 09/09/2016  . Imprisonment and other incarceration 09/09/2016    Past Surgical History:  Procedure Laterality Date  . CESAREAN SECTION  2008   x1  . CESAREAN SECTION N/A 04/03/2017   Procedure: CESAREAN SECTION;  Surgeon: Hermina StaggersErvin, Michael L, MD;  [facility];  Service: Obstetrics;  Laterality: N/A;  . COLPOSCOPY     abnormal pap  . INDUCED ABORTION    . TONSILLECTOMY    . WISDOM TOOTH EXTRACTION     x4    Prior to Admission medications   Medication Sig Start Date End Date Taking? Authorizing Provider  ibuprofen  (ADVIL,MOTRIN) 800 MG tablet Take 1 tablet (800 mg total) by mouth every 6 (six) hours. 04/05/17   Oralia ManisAbraham, Sherin, DO  oseltamivir (TAMIFLU) 75 MG capsule Take 1 capsule (75 mg total) by mouth 2 (two) times daily. 04/28/17   Fisher, Roselyn BeringSusan W, PA-C  senna-docusate (SENOKOT-S) 8.6-50 MG tablet Take 2 tablets by mouth daily. 04/06/17   Oralia ManisAbraham, Sherin, DO    Allergies Morphine and related and Sulfa antibiotics  Family History  Problem Relation Age of Onset  . Hypertension Father   . Diabetes Father   . Kidney disease Maternal Grandmother   . Heart disease Maternal Grandmother   . Alzheimer's disease Maternal Grandfather     Social History Social History   Tobacco Use  . Smoking status: Current Every Day Smoker    Packs/day: 0.10    Types: Cigarettes  . Smokeless tobacco: Never Used  Substance Use Topics  . Alcohol use: No  . Drug use: No    Comment: uses vape a month ago    Review of Systems  Constitutional: Positive fever/chills Eyes: No visual changes. ENT: Positive sore throat. Respiratory: Positive cough Gastrointestinal: Positive for vomiting yesterday, none today denies diarrhea Genitourinary: Negative for dysuria. Musculoskeletal: Negative for back pain. Skin: Negative for rash.    ____________________________________________   PHYSICAL EXAM:  VITAL SIGNS: ED Triage Vitals  Enc Vitals Group     BP 04/28/17 1626 130/83     Pulse Rate  04/28/17 1626 (!) 103     Resp 04/28/17 1626 20     Temp 04/28/17 1626 (!) 103.1 F (39.5 C)     Temp Source 04/28/17 1626 Oral     SpO2 04/28/17 1626 98 %     Weight 04/28/17 1627 180 lb (81.6 kg)     Height 04/28/17 1627 5\' 4"  (1.626 m)     Head Circumference --      Peak Flow --      Pain Score 04/28/17 1627 5     Pain Loc --      Pain Edu? --      Excl. in GC? --     Constitutional: Alert and oriented. Well appearing and in no acute distress.  Patient is febrile Eyes: Conjunctivae are normal.  Head:  Atraumatic. Nose: No congestion/rhinnorhea. Mouth/Throat: Mucous membranes are moist.  Throat is red Neck: Is supple, no lymphadenopathy is noted Cardiovascular: Normal rate, regular rhythm.  Heart sounds are normal Respiratory: Normal respiratory effort.  No retractions, lungs are clear to auscultation Abdomen: Soft, nontender, C-section suture line is well approximated, the area is not tender or red GU: deferred Musculoskeletal: FROM all extremities, warm and well perfused Neurologic:  Normal speech and language.  Skin:  Skin is warm, dry and intact. No rash noted. Psychiatric: Mood and affect are normal. Speech and behavior are normal.  ____________________________________________   LABS (all labs ordered are listed, but only abnormal results are displayed)  Labs Reviewed  URINALYSIS, COMPLETE (UACMP) WITH MICROSCOPIC - Abnormal; Notable for the following components:      Result Value   Color, Urine YELLOW (*)    APPearance CLEAR (*)    Hgb urine dipstick SMALL (*)    Bacteria, UA RARE (*)    Squamous Epithelial / LPF 0-5 (*)    All other components within normal limits  INFLUENZA PANEL BY PCR (TYPE A & B) - Abnormal; Notable for the following components:   Influenza A By PCR POSITIVE (*)    All other components within normal limits  GROUP A STREP BY PCR   ____________________________________________   ____________________________________________  RADIOLOGY   ____________________________________________   PROCEDURES  Procedure(s) performed: Saline lock, 1 L bolus of normal saline  Procedures    ____________________________________________   INITIAL IMPRESSION / ASSESSMENT AND PLAN / ED COURSE  Pertinent labs & imaging results that were available during my care of the patient were reviewed by me and considered in my medical decision making (see chart for details).  Patient is 36 year old female who is 3 weeks postpartum from C-section delivery.  She is  complaining of flulike symptoms.  She has cough congestion body aches  On physical exam patient is febrile, lungs are clear to auscultation and the abdomen is soft and nontender with no signs of infection at the C-section incision  Flu test, strep test, and urinalysis are ordered, patient was given a saline lock and 1 L bolus normal saline    ----------------------------------------- 6:21 PM on 04/28/2017 -----------------------------------------  Flu test is positive for influenza A, strep test negative, UA is normal.  Test results were discussed with the patient and her family.  She is to alternate Tylenol and ibuprofen for fever.  She is to remove herself from the care of the infant at this time.  She states she  found a sitter to take care of the baby. She is not breast-feeding.  Secondary infections were discussed with patient.  She is to return if she is  worsening.  States she understands.  She was discharged in stable condition  As part of my medical decision making, I reviewed the following data within the electronic MEDICAL RECORD NUMBER Nursing notes reviewed and incorporated, Labs reviewed flu test positive, strep test negative, UA normal, Notes from prior ED visits and [place] Controlled Substance Database  ____________________________________________   FINAL CLINICAL IMPRESSION(S) / ED DIAGNOSES  Final diagnoses:  Influenza A      NEW MEDICATIONS STARTED DURING THIS VISIT:  Discharge Medication List as of 04/28/2017  5:59 PM    START taking these medications   Details  oseltamivir (TAMIFLU) 75 MG capsule Take 1 capsule (75 mg total) by mouth 2 (two) times daily., Starting Tue 04/28/2017, Print         Note:  This document was prepared using Dragon voice recognition software and may include unintentional dictation errors.    Faythe Ghee, PA-C 04/28/17 Job Founds, MD 04/28/17 2117

## 2017-04-28 NOTE — Discharge Instructions (Signed)
Follow-up with your regular doctor if not better in 3 days.  Return to the emergency department if you are worsening.  Use the Tamiflu as prescribed.  Tylenol and ibuprofen as needed for fever.  Drink plenty of fluids.  Please wear a mask around the infant.  If the child begins to act sick please see the pediatrician as soon as possible

## 2017-04-30 ENCOUNTER — Other Ambulatory Visit: Payer: Self-pay

## 2017-04-30 ENCOUNTER — Encounter: Payer: Self-pay | Admitting: Emergency Medicine

## 2017-04-30 ENCOUNTER — Emergency Department
Admission: EM | Admit: 2017-04-30 | Discharge: 2017-04-30 | Disposition: A | Discharge status: Home / Self Care | Payer: Medicaid Other | Attending: Emergency Medicine | Admitting: Emergency Medicine

## 2017-04-30 DIAGNOSIS — F17210 Nicotine dependence, cigarettes, uncomplicated: Secondary | ICD-10-CM | POA: Insufficient documentation

## 2017-04-30 DIAGNOSIS — L270 Generalized skin eruption due to drugs and medicaments taken internally: Secondary | ICD-10-CM | POA: Diagnosis not present

## 2017-04-30 DIAGNOSIS — R21 Rash and other nonspecific skin eruption: Secondary | ICD-10-CM | POA: Diagnosis present

## 2017-04-30 NOTE — Discharge Instructions (Signed)
Follow-up with your primary care doctor if any continued problems.  Discontinue taking Tamiflu.  Benadryl 1 or 2 capsules every 6 hours as needed for itching and rash.  Increase fluids.

## 2017-04-30 NOTE — ED Triage Notes (Signed)
Pt states she started Tamiflu Tuesday night, states hives yesterday, took Bendaryl last night with little relief.  No respiratory issues noted, pt did not take Tamiflu this am, nor Benadryl.

## 2017-04-30 NOTE — ED Provider Notes (Signed)
[facility] Emergency Department Provider Note  ____________________________________________   First MD Initiated Contact with Patient 04/30/17 1129     (approximate)  I have reviewed the triage vital signs and the nursing notes.   HISTORY  Chief Complaint Rash  HPI Holly PickettStephanie M Gordon is a 37 y.o. female is here with a rash that she developed after her third dose of Tamiflu.  Patient states that she took 2 Benadryl (50 mg) last evening and rash is improved this morning.  Patient states she still itches some but less than yesterday.  She denies any difficulty with breathing or swallowing.  Patient has not taken any Tamiflu since she began with her rash.  Past Medical History:  Diagnosis Date  . Anemia   . Anxiety   . Bacterial vaginitis 08/26/2016  . Depression    had counseling in the passt no meds  . History of abnormal cervical Pap smear   . Vaginal Pap smear, abnormal     Patient Active Problem List   Diagnosis Date Noted  . Status post C-section 04/03/2017  . Marijuana abuse 09/18/2016  . Cervical dysplasia 09/15/2016  . Supervision of other normal pregnancy, antepartum 09/09/2016  . History of cesarean delivery affecting pregnancy 09/09/2016  . Obesity in pregnancy, antepartum 09/09/2016  . Body mass index (BMI) of 25.0 to 25.9 in adult 09/09/2016  . History of substance abuse 09/09/2016  . Tobacco abuse 09/09/2016  . History of depression 09/09/2016  . Imprisonment and other incarceration 09/09/2016    Past Surgical History:  Procedure Laterality Date  . CESAREAN SECTION  2008   x1  . CESAREAN SECTION N/A 04/03/2017   Procedure: CESAREAN SECTION;  Surgeon: Hermina StaggersErvin, Michael L, MD;  [facility];  Service: Obstetrics;  Laterality: N/A;  . COLPOSCOPY     abnormal pap  . INDUCED ABORTION    . TONSILLECTOMY    . WISDOM TOOTH EXTRACTION     x4    Prior to Admission medications   Medication Sig Start Date End Date  Taking? Authorizing Provider  ibuprofen (ADVIL,MOTRIN) 800 MG tablet Take 1 tablet (800 mg total) by mouth every 6 (six) hours. 04/05/17   Darin EngelsAbraham, Sherin, DO  senna-docusate (SENOKOT-S) 8.6-50 MG tablet Take 2 tablets by mouth daily. 04/06/17   Oralia ManisAbraham, Sherin, DO    Allergies Tamiflu [oseltamivir phosphate]; Morphine and related; and Sulfa antibiotics  Family History  Problem Relation Age of Onset  . Hypertension Father   . Diabetes Father   . Kidney disease Maternal Grandmother   . Heart disease Maternal Grandmother   . Alzheimer's disease Maternal Grandfather     Social History Social History   Tobacco Use  . Smoking status: Current Every Day Smoker    Packs/day: 0.10    Types: Cigarettes  . Smokeless tobacco: Never Used  Substance Use Topics  . Alcohol use: No  . Drug use: No    Comment: uses vape a month ago    Review of Systems Constitutional: No fever/chills Cardiovascular: Denies chest pain. Respiratory: Denies shortness of breath. Musculoskeletal: Negative for back pain. Skin: Positive for rash Neurological: Negative for headaches, focal weakness or numbness. ___________________________________________   PHYSICAL EXAM:  VITAL SIGNS: ED Triage Vitals  Enc Vitals Group     BP 04/30/17 1125 116/88     Pulse Rate 04/30/17 1123 81     Resp 04/30/17 1123 18     Temp 04/30/17 1123 98.9 F (37.2 C)     Temp  Source 04/30/17 1123 Oral     SpO2 04/30/17 1123 98 %     Weight 04/30/17 1124 180 lb (81.6 kg)     Height 04/30/17 1124 5\' 4"  (1.626 m)     Head Circumference --      Peak Flow --      Pain Score --      Pain Loc --      Pain Edu? --      Excl. in GC? --    Constitutional: Alert and oriented. Well appearing and in no acute distress. Eyes: Conjunctivae are normal. PERRL. EOMI. Head: Atraumatic. Nose: No congestion/rhinnorhea. Mouth/Throat: Mucous membranes are moist.  Oropharynx non-erythematous.  Uvula is midline. Neck: No stridor.     Hematological/Lymphatic/Immunilogical: No cervical lymphadenopathy. Cardiovascular: Normal rate, regular rhythm. Grossly normal heart sounds.  Good peripheral circulation. Respiratory: Normal respiratory effort.  No retractions. Lungs CTAB. Musculoskeletal: Moves upper and lower extremities without any difficulty.  Normal gait noted. Neurologic:  Normal speech and language. No gross focal neurologic deficits are appreciated.  Skin:  Skin is warm, dry.  Erythematous macular papular areas noted on the upper and lower extremities and anterior/posterior trunk.  Patient has a picture of her rash from last evening prior to Benadryl and rash today appears to have improved.  No drainage is noted. Psychiatric: Mood and affect are normal. Speech and behavior are normal.  ____________________________________________   LABS (all labs ordered are listed, but only abnormal results are displayed)  Labs Reviewed - No data to display  PROCEDURES  Procedure(s) performed: None  Procedures  Critical Care performed: No  ____________________________________________   INITIAL IMPRESSION / ASSESSMENT AND PLAN / ED COURSE  Patient is afebrile in the department today and states that her symptoms of influenza have improved greatly.  She is to discontinue taking the Tamiflu at this time.  She will continue taking Benadryl 1 or 2 capsules every 6 hours as needed for itching.  Increase fluids.  Follow-up with her PCP or return to the emergency room if any worsening of her symptoms. ____________________________________________   FINAL CLINICAL IMPRESSION(S) / ED DIAGNOSES  Final diagnoses:  Dermatitis due to drug reaction     ED Discharge Orders    None       Note:  This document was prepared using Dragon voice recognition software and may include unintentional dictation errors.    Tommi Rumps, PA-C 04/30/17 1359    Governor Rooks, MD 04/30/17 [phone]

## 2017-04-30 NOTE — ED Notes (Signed)
See triage note  Developed a generalized body rash  With itching    Rash started after taking tamiflu  No resp issues.

## 2017-04-30 NOTE — ED Notes (Signed)
See triage note ..developed a rash after starting tamiflu

## 2017-05-13 ENCOUNTER — Ambulatory Visit (INDEPENDENT_AMBULATORY_CARE_PROVIDER_SITE_OTHER): Payer: Medicaid Other | Admitting: Student

## 2017-05-13 ENCOUNTER — Encounter: Payer: Self-pay | Admitting: Student

## 2017-05-13 VITALS — BP 112/74 | HR 72 | Wt 173.4 lb

## 2017-05-13 DIAGNOSIS — Z3480 Encounter for supervision of other normal pregnancy, unspecified trimester: Secondary | ICD-10-CM

## 2017-05-13 DIAGNOSIS — Z1389 Encounter for screening for other disorder: Secondary | ICD-10-CM | POA: Diagnosis not present

## 2017-05-13 DIAGNOSIS — E039 Hypothyroidism, unspecified: Secondary | ICD-10-CM | POA: Insufficient documentation

## 2017-05-13 MED ORDER — NORETHINDRONE 0.35 MG PO TABS: 1.0000 | ORAL_TABLET | Freq: Every day | ORAL | 11 refills | Status: DC

## 2017-05-13 NOTE — Patient Instructions (Addendum)
Hormonal Contraception Information °Hormonal contraception is a type of birth control that uses hormones to prevent pregnancy. It usually involves a combination of the hormones estrogen and progesterone or only the hormone progesterone. Hormonal contraception works in these ways: °· It thickens the mucus in the cervix, making it harder for sperm to enter the uterus. °· It changes the lining of the uterus, making it harder for an egg to implant. °· It may stop the ovaries from releasing eggs (ovulation). Some women who take hormonal contraceptives that contain only progesterone may continue to ovulate. ° °Hormonal contraception cannot prevent sexually transmitted infections (STIs). Pregnancy may still occur. °Estrogen and progesterone contraceptives °Contraceptives that use a combination of estrogen and progesterone are available in these forms: °· Pill. Pills come in different combinations of hormones. They must be taken at the same time each day. Pills can affect your period, causing you to get your period once every three months or not at all. °· Patch. The patch must be worn on the lower abdomen for three weeks and then removed on the fourth. °· Vaginal ring. The ring is placed in the vagina and left there for three weeks. It is then removed for one week. ° °Progesterone contraceptives °Contraceptives that use progesterone only are available in these forms: °· Pill. Pills should be taken every day of the cycle. °· Intrauterine device (IUD). This device is inserted into the uterus and removed or replaced every five years or sooner. °· Implant. Plastic rods are placed under the skin of the upper arm. They are removed or replaced every three years or sooner. °· Injection. The injection is given once every 90 days. ° °What are the side effects? °The side effects of estrogen and progesterone contraceptives include: °· Nausea. °· Headaches. °· Breast tenderness. °· Bleeding or spotting between menstrual cycles. °· High  blood pressure (rare). °· Strokes, heart attacks, or blood clots (rare) ° °Side effects of progesterone-only contraceptives include: °· Nausea. °· Headaches. °· Breast tenderness. °· Unpredictable menstrual bleeding. °· High blood pressure (rare). ° °Talk to your health care provider about what side effects may affect you. °Where to find more information: °· Ask your health care provider for more information and resources about hormonal contraception. °· U.S. Department of Health and Human Services Office on Women's Health: www.womenshealth.gov °Questions to ask: °· What type of hormonal contraception is right for me? °· How long should I plan to use hormonal contraception? °· What are the side effects of the hormonal contraception method I choose? °· How can I prevent STIs while using hormonal contraception? °Contact a health care provider if: °· You start taking hormonal contraceptives and you develop persistent or severe side effects. °Summary °· Estrogen and progesterone are hormones used in many forms of birth control. °· Talk to your health care provider about what side effects may affect you. °· Hormonal contraception cannot prevent sexually transmitted infections (STIs). °· Ask your health care provider for more information and resources about hormonal contraception. °This information is not intended to replace advice given to you by your health care provider. Make sure you discuss any questions you have with your health care provider. °Document Released: 03/09/2007 Document Revised: 01/18/2016 Document Reviewed: 01/18/2016 °Elsevier Interactive Patient Education © 2018 Elsevier Inc. ° °

## 2017-05-13 NOTE — Progress Notes (Signed)
Post Partum Exam  Holly PickettStephanie M Gordon is a 37 y.o. [id number] female who presents for a postpartum visit. She is six weeks postpartum following a delivery on 04/03/2017. I have fully reviewed the prenatal and intrapartum course. The delivery was at 39 gestational weeks.  Anesthesia: Spinal . Postpartum course has been uncomplicated. Baby's course has been uncomplicated. Baby is feeding by bottle. Bleeding not at this time. Bowel function is normal. Bladder function is normal. Patient is not  sexually active. Contraception method is nothing at this time. Postpartum depression screening:neg  Patient doing well; no complaints. Had the flu earlier but recovered. Infant did not get sick.  Last pap smear done 2017 and was +HPV 16: Colpo needed PP.   Review of Systems Pertinent items are noted in HPI.    Objective:  Blood pressure 112/74, pulse 72, weight 173 lb 6.4 oz (78.7 kg), unknown if currently breastfeeding.  General:  alert, cooperative and no distress   Breasts:  inspection negative, no nipple discharge or bleeding, no masses or nodularity palpable  Lungs: clear to auscultation bilaterally  Heart:  regular rate and rhythm, S1, S2 normal, no murmur, click, rub or gallop  Abdomen: soft, non-tender; bowel sounds normal; no masses,  no organomegaly   Vulva:  not evaluated  Vagina: not evaluated  Cervix:  not evaluated.   Corpus: not examined  Adnexa:  not evaluated  Rectal Exam: Not performed.        Assessment:    Healthy postpartum exam. Pap smear not done at today's visit.   Plan:   1. Contraception: oral progesterone-only contraceptive 2. Patient will return for pp colposcopy. Emphasized the importance of keeping this appointment.  3. Patient wants her thyroid tested today as she heard that thyroid issues run in her family.  3. Follow up for PP colposcopy.  Holly KitchensKathryn Gordon CNM

## 2017-05-14 LAB — TSH: TSH: 1.29 u[IU]/mL (ref 0.450–4.500)

## 2017-05-25 ENCOUNTER — Encounter: Payer: Self-pay | Admitting: Obstetrics & Gynecology

## 2017-05-25 ENCOUNTER — Ambulatory Visit (INDEPENDENT_AMBULATORY_CARE_PROVIDER_SITE_OTHER): Payer: Medicaid Other | Admitting: Obstetrics & Gynecology

## 2017-05-25 VITALS — BP 128/81 | HR 72 | Wt 181.0 lb

## 2017-05-25 DIAGNOSIS — R87810 Cervical high risk human papillomavirus (HPV) DNA test positive: Secondary | ICD-10-CM | POA: Diagnosis not present

## 2017-05-25 NOTE — Addendum Note (Signed)
Addended by: Jaynie CollinsANYANWU, Yareliz Thorstenson A on: 05/25/2017 02:12 PM   Modules accepted: Level of Service

## 2017-05-25 NOTE — Progress Notes (Signed)
    GYNECOLOGY CLINIC COLPOSCOPY PROCEDURE NOTE  37 y.o. [id number] here for colposcopy for benign cellular changes and HPV 16 on  pap smear on 09/09/16. Discussed role for HPV in cervical dysplasia, need for surveillance.  Patient given informed consent, signed copy in the chart, time out was performed.  Placed in lithotomy position. Cervix viewed with speculum and colposcope after application of acetic acid.   Colposcopy adequate? Yes Acetowhite lesion(s) noted at 11 o'clock; corresponding biopsies obtained.  ECC specimen obtained. All specimens were labeled and sent to pathology.   Patient was given post procedure instructions.  Will follow up pathology and manage accordingly; patient will be contacted with results and recommendations.  Routine preventative health maintenance measures emphasized.    Jaynie CollinsUGONNA  Hernan Turnage, MD, FACOG Obstetrician & Gynecologist, [facility], [facility]

## 2017-05-25 NOTE — Patient Instructions (Signed)

## 2017-05-27 ENCOUNTER — Encounter: Payer: Self-pay | Admitting: Obstetrics & Gynecology

## 2017-05-27 DIAGNOSIS — N870 Mild cervical dysplasia: Secondary | ICD-10-CM

## 2018-09-17 ENCOUNTER — Emergency Department: Payer: Medicaid Other

## 2018-09-17 ENCOUNTER — Emergency Department
Admission: EM | Admit: 2018-09-17 | Discharge: 2018-09-17 | Disposition: A | Discharge status: Home / Self Care | Payer: Medicaid Other | Attending: Emergency Medicine | Admitting: Emergency Medicine

## 2018-09-17 ENCOUNTER — Encounter: Payer: Self-pay | Admitting: Emergency Medicine

## 2018-09-17 ENCOUNTER — Other Ambulatory Visit: Payer: Self-pay

## 2018-09-17 DIAGNOSIS — O99332 Smoking (tobacco) complicating pregnancy, second trimester: Secondary | ICD-10-CM | POA: Diagnosis not present

## 2018-09-17 DIAGNOSIS — S93401A Sprain of unspecified ligament of right ankle, initial encounter: Secondary | ICD-10-CM

## 2018-09-17 DIAGNOSIS — O9A212 Injury, poisoning and certain other consequences of external causes complicating pregnancy, second trimester: Secondary | ICD-10-CM | POA: Diagnosis not present

## 2018-09-17 DIAGNOSIS — Z3A27 27 weeks gestation of pregnancy: Secondary | ICD-10-CM

## 2018-09-17 DIAGNOSIS — Y998 Other external cause status: Secondary | ICD-10-CM | POA: Insufficient documentation

## 2018-09-17 DIAGNOSIS — W010XXA Fall on same level from slipping, tripping and stumbling without subsequent striking against object, initial encounter: Secondary | ICD-10-CM | POA: Diagnosis not present

## 2018-09-17 DIAGNOSIS — Y9301 Activity, walking, marching and hiking: Secondary | ICD-10-CM | POA: Diagnosis not present

## 2018-09-17 DIAGNOSIS — F17210 Nicotine dependence, cigarettes, uncomplicated: Secondary | ICD-10-CM | POA: Diagnosis not present

## 2018-09-17 DIAGNOSIS — E039 Hypothyroidism, unspecified: Secondary | ICD-10-CM | POA: Diagnosis not present

## 2018-09-17 DIAGNOSIS — Y92093 Driveway of other non-institutional residence as the place of occurrence of the external cause: Secondary | ICD-10-CM | POA: Diagnosis not present

## 2018-09-17 DIAGNOSIS — Z5320 Procedure and treatment not carried out because of patient's decision for unspecified reasons: Secondary | ICD-10-CM | POA: Insufficient documentation

## 2018-09-17 DIAGNOSIS — W19XXXA Unspecified fall, initial encounter: Secondary | ICD-10-CM

## 2018-09-17 NOTE — ED Provider Notes (Signed)
[facility] Emergency Department Provider Note  ____________________________________________  Time seen: Approximately 7:24 PM  I have reviewed the triage vital signs and the nursing notes.   HISTORY  Chief Complaint Fall    HPI Holly PickettStephanie M Jerez is a 38 y.o. female that presents to the emergency department for evaluation after fall.  Patient was walking on her driveway when she tripped in a divot in the driveway and her ankle twisted inwards.  Patient landed on her bottom.  Patient is [redacted] weeks pregnant with her fourth child.  Patient has not seen OB for this pregnancy yet.  [facility].  No abdominal cramping, vaginal bleeding, numbness, tingling.  Past Medical History:  Diagnosis Date  . Anemia   . Anxiety   . Bacterial vaginitis 08/26/2016  . Depression    had counseling in the passt no meds  . History of abnormal cervical Pap smear   . Vaginal Pap smear, abnormal     Patient Active Problem List   Diagnosis Date Noted  . Mild dysplasia of cervix (CIN I) 05/27/2017  . Hypothyroidism 05/13/2017  . Marijuana abuse 09/18/2016  . Cervical high risk HPV (human papillomavirus) test positive with normal cytology 08/2016 09/15/2016  . Body mass index (BMI) of 25.0 to 25.9 in adult 09/09/2016  . History of substance abuse (HCC) 09/09/2016  . Tobacco abuse 09/09/2016  . History of depression 09/09/2016    Past Surgical History:  Procedure Laterality Date  . CESAREAN SECTION  2008   x1  . CESAREAN SECTION N/A 04/03/2017   Procedure: CESAREAN SECTION;  Surgeon: Hermina StaggersErvin, Michael L, MD;  [facility];  Service: Obstetrics;  Laterality: N/A;  . COLPOSCOPY     abnormal pap  . INDUCED ABORTION    . TONSILLECTOMY    . WISDOM TOOTH EXTRACTION     x4    Prior to Admission medications   Medication Sig Start Date End Date Taking? Authorizing Provider  norethindrone (MICRONOR,CAMILA,ERRIN) 0.35 MG tablet Take 1  tablet (0.35 mg total) by mouth daily. 05/13/17   Marylene LandKooistra, Kathryn Lorraine, CNM    Allergies Tamiflu [oseltamivir phosphate], Morphine and related, and Sulfa antibiotics  Family History  Problem Relation Age of Onset  . Hypertension Father   . Diabetes Father   . Kidney disease Maternal Grandmother   . Heart disease Maternal Grandmother   . Alzheimer's disease Maternal Grandfather     Social History Social History   Tobacco Use  . Smoking status: Current Every Day Smoker    Packs/day: 0.10    Types: Cigarettes  . Smokeless tobacco: Never Used  Substance Use Topics  . Alcohol use: No  . Drug use: No    Comment: uses vape a month ago     Review of Systems  Respiratory: No SOB. Gastrointestinal: No abdominal pain.  No nausea, no vomiting.  Musculoskeletal: Positive for ankle pain. Skin: Negative for rash, abrasions, lacerations.positive for ecchymosis. Neurological: Negative for numbness or tingling   ____________________________________________   PHYSICAL EXAM:  VITAL SIGNS: ED Triage Vitals  Enc Vitals Group     BP 09/17/18 1437 137/71     Pulse Rate 09/17/18 1437 80     Resp 09/17/18 1437 18     Temp 09/17/18 1437 98.9 F (37.2 C)     Temp Source 09/17/18 1437 Oral     SpO2 09/17/18 1437 99 %     Weight 09/17/18 1444 174 lb (78.9 kg)  Height 09/17/18 1444 5\' 4"  (1.626 m)     Head Circumference --      Peak Flow --      Pain Score 09/17/18 1444 5     Pain Loc --      Pain Edu? --      Excl. in GC? --      Constitutional: Alert and oriented. Well appearing and in no acute distress. Eyes: Conjunctivae are normal. PERRL. EOMI. Head: Atraumatic. ENT:      Ears:      Nose: No congestion/rhinnorhea.      Mouth/Throat: Mucous membranes are moist.  Neck: No stridor.   Cardiovascular: Normal rate, regular rhythm.  Good peripheral circulation. Respiratory: Normal respiratory effort without tachypnea or retractions. Lungs CTAB. Good air entry to the  bases with no decreased or absent breath sounds. Gastrointestinal: Bowel sounds 4 quadrants. Soft and nontender to palpation. No guarding or rigidity. No palpable masses. No distention.  Musculoskeletal: Full range of motion to all extremities. No gross deformities appreciated.  Mild swelling and ecchymosis to right lateral malleolus.  Symmetric dorsalis pedis pulses. Neurologic:  Normal speech and language. No gross focal neurologic deficits are appreciated.  Skin:  Skin is warm, dry and intact. No rash noted. Psychiatric: Mood and affect are normal. Speech and behavior are normal. Patient exhibits appropriate insight and judgement.   ____________________________________________   LABS (all labs ordered are listed, but only abnormal results are displayed)  Labs Reviewed - No data to display ____________________________________________  EKG   ____________________________________________  RADIOLOGY Lexine BatonI, Ileen Kahre, personally viewed and evaluated these images (plain radiographs) as part of my medical decision making, as well as reviewing the written report by the radiologist.  Dg Ankle Complete Right  Result Date: 09/17/2018 CLINICAL DATA:  Pain and swelling following fall with inversion injury EXAM: RIGHT ANKLE - COMPLETE 3+ VIEW COMPARISON:  None. FINDINGS: Frontal, oblique, and lateral views were obtained. There is marked soft tissue swelling laterally. There is no appreciable fracture or joint effusion. There is no appreciable joint space narrowing or erosion. Ankle mortise appears intact. IMPRESSION: Soft tissue swelling laterally. No evident fracture. No appreciable arthropathy. Ankle mortise appears intact. Electronically Signed   By: Bretta BangWilliam  Woodruff III M.D.   On: 09/17/2018 15:13   [place] Ob Limited  Result Date: 09/17/2018 CLINICAL DATA:  Fall, no prenatal care EXAM: LIMITED OBSTETRIC ULTRASOUND FINDINGS: Number of Fetuses: 1 Heart Rate:  153 bpm Movement: Visualized  Presentation: Cephalic Placental Location: Posterior Previa: Absent Amniotic Fluid (Subjective):  Within normal limits. AFI:  cm BPD: 6.8 cm 27 w  3 d MATERNAL FINDINGS: Cervix:  Appears closed. Uterus/Adnexae: No abnormality visualized. IMPRESSION: Approximately 27 week 3 day intrauterine pregnancy. Fetal heart rate 153 beats per minute. No acute maternal findings. This exam is performed on an emergent basis and does not comprehensively evaluate fetal size, dating, or anatomy; follow-up complete OB US should be considered if further fetal assessment is warranted. Electronically Signed   By: Charlett NoseKevin  Dover M.D.   On: 09/17/2018 18:44    ____________________________________________    PROCEDURES  Procedure(s) performed:    Procedures    Medications - No data to display   ____________________________________________   INITIAL IMPRESSION / ASSESSMENT AND PLAN / ED COURSE  Pertinent labs & imaging results that were available during my care of the patient were reviewed by me and considered in my medical decision making (see chart for details).  Review of the [place] CSRS was performed in accordance of  the [place] prior to dispensing any controlled drugs.     Patient presented to emergency department for evaluation after fall today.  Vital signs and exam are reassuring.  Ankle x-ray negative for acute bony abnormalities.  Ankle was splinted and crutches were given.  Fetal heart tones in emergency department at 140 bpm.  Ultrasound shows intrauterine pregnancy about 27 weeks with fetal heart rate 153 in no acute findings.  Patient does not want to stay in the emergency department any longer for observation of fetus.  Patient understands risks.  She will sign out [facility].  Patient is to follow up with OB and orthopedics as directed.  Referrals were given to [facility] and Dr. Rudene Christians.  Patient is given ED precautions to return to the ED for any worsening or new symptoms.  Alease Fait  Bonito was evaluated in Emergency Department on 09/17/2018 for the symptoms described in the history of present illness. She was evaluated in the context of the global COVID-19 pandemic, which necessitated consideration that the patient might be at risk for infection with the SARS-CoV-2 virus that causes COVID-19. Institutional protocols and algorithms that pertain to the evaluation of patients at risk for COVID-19 are in a state of rapid change based on information released by regulatory bodies including the CDC and federal and state organizations. These policies and algorithms were followed during the patient's care in the ED.   ____________________________________________  FINAL CLINICAL IMPRESSION(S) / ED DIAGNOSES  Final diagnoses:  Fall, initial encounter  Sprain of right ankle, unspecified ligament, initial encounter  [redacted] weeks gestation of pregnancy      NEW MEDICATIONS STARTED DURING THIS VISIT:  ED Discharge Orders    None          This chart was dictated using voice recognition software/Dragon. Despite best efforts to proofread, errors can occur which can change the meaning. Any change was purely unintentional.    Laban Emperor, PA-C 09/17/18 2041    Nance Pear, MD 09/17/18 2133

## 2018-09-17 NOTE — Discharge Instructions (Signed)
Your x-ray is negative for any bony fracture to your foot.  Please wear ankle splint.  Keep ankle elevated tonight.  Ice ankle when you are home.  I recommend that you stay after your fall to have the baby monitored.  Please return to the emergency department for any abdominal pain, vaginal bleeding.  Please follow-up with OB and orthopedics.

## 2018-09-17 NOTE — ED Notes (Signed)
Pt refuses d/c vital signs.

## 2018-09-17 NOTE — ED Triage Notes (Signed)
Patient states she stepped off the side of her driveway and felt a pop in her right ankle. Now reports pain with movement and weight bearing. Reports she landed on her bottom. Patient states she is [redacted] weeks pregnant and want to have baby evaluated as well.   Patient reports feeling baby move a normal amount since fall. Denies abdominal cramping or discharge.  Fetal HR: 140

## 2018-09-28 ENCOUNTER — Telehealth: Payer: Self-pay

## 2018-09-28 NOTE — Telephone Encounter (Signed)
Call to follow-up +PT; reports has not seen a care provider yet-no Medicaid except [facility] and states has left messages for Medicaid worker; informed can be seen at [facility] without prior Medicaid and discussed appt scheduling; states is on crutches with sprained ankle and will try to come in soon; desires proof of pregnancy to be given to Medicaid-informed will send to Medicaid worker today Debera Lat, RN

## 2018-10-26 ENCOUNTER — Encounter: Payer: Self-pay | Admitting: Obstetrics and Gynecology

## 2018-10-26 ENCOUNTER — Other Ambulatory Visit (HOSPITAL_COMMUNITY)
Admission: RE | Admit: 2018-10-26 | Discharge: 2018-10-26 | Disposition: A | Discharge status: Home / Self Care | Payer: Medicaid Other | Source: Ambulatory Visit | Attending: Obstetrics and Gynecology | Admitting: Obstetrics and Gynecology

## 2018-10-26 ENCOUNTER — Other Ambulatory Visit: Payer: Self-pay

## 2018-10-26 ENCOUNTER — Ambulatory Visit (INDEPENDENT_AMBULATORY_CARE_PROVIDER_SITE_OTHER): Payer: Medicaid Other | Admitting: Obstetrics and Gynecology

## 2018-10-26 VITALS — BP 134/84 | HR 96 | Wt 180.2 lb

## 2018-10-26 DIAGNOSIS — F1911 Other psychoactive substance abuse, in remission: Secondary | ICD-10-CM

## 2018-10-26 DIAGNOSIS — O09523 Supervision of elderly multigravida, third trimester: Secondary | ICD-10-CM

## 2018-10-26 DIAGNOSIS — Z720 Tobacco use: Secondary | ICD-10-CM

## 2018-10-26 DIAGNOSIS — E038 Other specified hypothyroidism: Secondary | ICD-10-CM

## 2018-10-26 DIAGNOSIS — N870 Mild cervical dysplasia: Secondary | ICD-10-CM

## 2018-10-26 DIAGNOSIS — O0933 Supervision of pregnancy with insufficient antenatal care, third trimester: Secondary | ICD-10-CM | POA: Diagnosis not present

## 2018-10-26 DIAGNOSIS — R87810 Cervical high risk human papillomavirus (HPV) DNA test positive: Secondary | ICD-10-CM

## 2018-10-26 DIAGNOSIS — Z98891 History of uterine scar from previous surgery: Secondary | ICD-10-CM

## 2018-10-26 DIAGNOSIS — O0993 Supervision of high risk pregnancy, unspecified, third trimester: Secondary | ICD-10-CM | POA: Diagnosis not present

## 2018-10-26 DIAGNOSIS — O26893 Other specified pregnancy related conditions, third trimester: Secondary | ICD-10-CM

## 2018-10-26 DIAGNOSIS — Z23 Encounter for immunization: Secondary | ICD-10-CM | POA: Diagnosis not present

## 2018-10-26 DIAGNOSIS — O0930 Supervision of pregnancy with insufficient antenatal care, unspecified trimester: Secondary | ICD-10-CM

## 2018-10-26 DIAGNOSIS — Z3480 Encounter for supervision of other normal pregnancy, unspecified trimester: Secondary | ICD-10-CM | POA: Diagnosis not present

## 2018-10-26 DIAGNOSIS — O0990 Supervision of high risk pregnancy, unspecified, unspecified trimester: Secondary | ICD-10-CM | POA: Insufficient documentation

## 2018-10-26 NOTE — Addendum Note (Signed)
Addended by: Phillip Heal, Cire Clute A on: 10/26/2018 02:47 PM   Modules accepted: Orders

## 2018-10-26 NOTE — Progress Notes (Signed)
New OB Note  10/26/2018   Clinic: [facility]  Chief Complaint: NOB  Transfer of Care Patient: no  History of Present Illness: Ms. Korte is a 38 y.o. [id number] @ 32/4 weeks ([place] 10/16, based on Patient's last menstrual period was 03/12/2018 (exact date).=27wk u/s.  Preg complicated by has Body mass index (BMI) of 25.0 to 25.9 in adult; History of substance abuse ([place]); Tobacco abuse; History of depression; Cervical high risk HPV (human papillomavirus) test positive with normal cytology 08/2016; Marijuana abuse; Hypothyroidism; Mild dysplasia of cervix (CIN I); and Supervision of other normal pregnancy, antepartum on their problem list.   Any events prior to today's visit: no She was using no method when she conceived.  She has Negative signs or symptoms of nausea/vomiting of pregnancy. She has Negative signs or symptoms of miscarriage or preterm labor On any medications around the time she conceived/early pregnancy: No    ROS: A 12-point review of systems was performed and negative, except as stated in the above HPI.  OBGYN History: As per HPI. OB History  Gravida Para Term Preterm AB Living  7 3 3  0 3 2  SAB TAB Ectopic Multiple Live Births  2 1 0 0 3    # Outcome Date GA Lbr Len/2nd Weight Sex Delivery Anes PTL Lv  7 Current           6 Term 04/03/17 [redacted]w[redacted]d  5 lb 12.4 oz (2.62 kg) F CS-LTranv Spinal  LIV  5 Term 2008    F CS-Unspec   DEC  4 Term 2007    M Vag-Spont   LIV  3 SAB      SAB     2 TAB      TAB     1 SAB      SAB       Any issues with any prior pregnancies: no Prior children are healthy, doing well, and without any problems or issues: yes History of pap smears: Yes. Last pap smear 2018 and results were CIN 1 on pp colposcopy   Past Medical History: Past Medical History:  Diagnosis Date  . Anemia   . Anxiety   . Bacterial vaginitis 08/26/2016  . Depression    had counseling in the passt no meds  . History of abnormal cervical  Pap smear   . Vaginal Pap smear, abnormal     Past Surgical History: Past Surgical History:  Procedure Laterality Date  . CESAREAN SECTION  2008   x1  . CESAREAN SECTION N/A 04/03/2017   Procedure: CESAREAN SECTION;  Surgeon: Chancy Milroy, MD;  Location: [place];  Service: Obstetrics;  Laterality: N/A;  . COLPOSCOPY     abnormal pap  . INDUCED ABORTION    . TONSILLECTOMY    . WISDOM TOOTH EXTRACTION     x4    Family History:  Family History  Problem Relation Age of Onset  . Hypertension Father   . Diabetes Father   . Kidney disease Maternal Grandmother   . Heart disease Maternal Grandmother   . Alzheimer's disease Maternal Grandfather     Social History:  Social History   Socioeconomic History  . Marital status: Single    Spouse name: Not on file  . Number of children: Not on file  . Years of education: Not on file  . Highest education level: Not on file  Occupational History  . Not on file  Social Needs  . Financial resource strain:  Not on file  . Food insecurity    Worry: Not on file    Inability: Not on file  . Transportation needs    Medical: Not on file    Non-medical: Not on file  Tobacco Use  . Smoking status: Current Every Day Smoker    Packs/day: 0.10    Types: Cigarettes  . Smokeless tobacco: Never Used  Substance and Sexual Activity  . Alcohol use: No  . Drug use: No    Comment: uses vape a month ago  . Sexual activity: Yes    Partners: Male    Birth control/protection: None  Lifestyle  . Physical activity    Days per week: Not on file    Minutes per session: Not on file  . Stress: Not on file  Relationships  . Social Musician on phone: Not on file    Gets together: Not on file    Attends religious service: Not on file    Active member of club or organization: Not on file    Attends meetings of clubs or organizations: Not on file    Relationship status: Not on file  . Intimate partner violence    Fear of  current or ex partner: Not on file    Emotionally abused: Not on file    Physically abused: Not on file    Forced sexual activity: Not on file  Other Topics Concern  . Not on file  Social History Narrative  . Not on file     Allergy: Allergies  Allergen Reactions  . Tamiflu [Oseltamivir Phosphate] Hives  . Morphine And Related Rash    Received duramorph 04/03/17; tolerated  . Sulfa Antibiotics Rash    Health Maintenance:  Mammogram Up to Date: not applicable  Current Outpatient Medications: PNV  Physical Exam:   BP 134/84   Pulse 96   Wt 180 lb 3.2 oz (81.7 kg)   LMP 03/12/2018 (Exact Date)   BMI 30.93 kg/m  Body mass index is 30.93 kg/m. Contractions: Not present Vag. Bleeding: None. Fundal height: 33 FHTs: 140s  General appearance: Well nourished, well developed female in no acute distress.  Declines exam today  Laboratory: none   Imaging:  09/17/18 ED limited u/s c/w 27/3  Assessment: pt stable  Plan: 1. Supervision of high risk elderly multigravida in third trimester Routine care. BTL Papers today. Down to two cigarettes per day - Glucose Tolerance, 2 Hours w/1 Hour - Obstetric Panel, Including HIV - [place] MFM OB DETAIL +14 WK; Future - Culture, OB Urine - Genetic Screening - [facility] AMB BABYSCRIPTS SCHEDULE OPTIMIZATION - TSH - [id number] 11+Oxyco+Alc+Crt-Bund - Cytology - PAP( [place]) - Comprehensive metabolic panel - Protein / creatinine ratio, urine  2. Late prenatal care Patient states she was knew she was pregnant but didn't feel need to get [facility] - [id number] 11+Oxyco+Alc+Crt-Bund  3. Mild dysplasia of cervix (CIN I) Patient declines pap. Risk of cx cancer d/w her. Try and do PP  4. Other specified hypothyroidism - TSH  5. Multigravida of advanced maternal age in third trimester Panorama today - Genetic Screening  6. History of substance abuse (HCC) - [id number] 11+Oxyco+Alc+Crt-Bund  7. History of c-section Emailed for 39wk c-section  8.  Right foot discomfort Pt able to walk on it. Looks normal. Has pregnancy medicaid. Recommend UC eval. No e/o VTE  Problem list reviewed and updated.  Follow up in 2 weeks.  The nature of [place] [facility] Faculty  Practice with multiple MDs and other Advanced Practice Providers was explained to patient; also emphasized that residents, students are part of our team.  >50% of 25 min visit spent on counseling and coordination of care.     Cornelia Copaharlie Zafar Debrosse, Jr. MD Attending [facility](Faculty Practice)

## 2018-10-27 LAB — GLUCOSE TOLERANCE, 2 HOURS W/ 1HR
Glucose, 1 hour: 170 mg/dL (ref 65–179)
Glucose, 2 hour: 82 mg/dL (ref 65–152)
Glucose, Fasting: 69 mg/dL (ref 65–91)

## 2018-10-27 LAB — OBSTETRIC PANEL, INCLUDING HIV
Antibody Screen: NEGATIVE
Basophils Absolute: 0.1 10*3/uL (ref 0.0–0.2)
Basos: 0 %
EOS (ABSOLUTE): 0.2 10*3/uL (ref 0.0–0.4)
Eos: 2 %
HIV Screen 4th Generation wRfx: NONREACTIVE
Hematocrit: 39.1 % (ref 34.0–46.6)
Hemoglobin: 12.5 g/dL (ref 11.1–15.9)
Hepatitis B Surface Ag: NEGATIVE
Immature Grans (Abs): 0 10*3/uL (ref 0.0–0.1)
Immature Granulocytes: 0 %
Lymphocytes Absolute: 2.7 10*3/uL (ref 0.7–3.1)
Lymphs: 18 %
MCH: 30.9 pg (ref 26.6–33.0)
MCHC: 32 g/dL (ref 31.5–35.7)
MCV: 97 fL (ref 79–97)
Monocytes Absolute: 1.2 10*3/uL — ABNORMAL HIGH (ref 0.1–0.9)
Monocytes: 8 %
Neutrophils Absolute: 10.3 10*3/uL — ABNORMAL HIGH (ref 1.4–7.0)
Neutrophils: 72 %
Platelets: 249 10*3/uL (ref 150–450)
RBC: 4.05 x10E6/uL (ref 3.77–5.28)
RDW: 12.8 % (ref 11.7–15.4)
RPR Ser Ql: NONREACTIVE
Rh Factor: POSITIVE
Rubella Antibodies, IGG: 2.98 index (ref >0.99)
WBC: 14.5 10*3/uL — ABNORMAL HIGH (ref 3.4–10.8)

## 2018-10-27 LAB — COMPREHENSIVE METABOLIC PANEL
ALT: 18 IU/L (ref 0–32)
AST: 20 IU/L (ref 0–40)
Albumin/Globulin Ratio: 1.5 (ref 1.2–2.2)
Albumin: 3.8 g/dL (ref 3.8–4.8)
Alkaline Phosphatase: 150 IU/L — ABNORMAL HIGH (ref 39–117)
BUN/Creatinine Ratio: 12 (ref 9–23)
BUN: 7 mg/dL (ref 6–20)
Bilirubin Total: 0.4 mg/dL (ref 0.0–1.2)
CO2: 21 mmol/L (ref 20–29)
Calcium: 9 mg/dL (ref 8.7–10.2)
Chloride: 103 mmol/L (ref 96–106)
Creatinine, Ser: 0.58 mg/dL (ref 0.57–1.00)
GFR calc Af Amer: 137 mL/min/{1.73_m2} (ref >59)
GFR calc non Af Amer: 119 mL/min/{1.73_m2} (ref >59)
Globulin, Total: 2.6 g/dL (ref 1.5–4.5)
Glucose: 69 mg/dL (ref 65–99)
Potassium: 4.2 mmol/L (ref 3.5–5.2)
Sodium: 139 mmol/L (ref 134–144)
Total Protein: 6.4 g/dL (ref 6.0–8.5)

## 2018-10-27 LAB — TSH: TSH: 1.24 u[IU]/mL (ref 0.450–4.500)

## 2018-10-27 LAB — DRUG SCREEN 764883 11+OXYCO+ALC+CRT-BUND
Amphetamines, Urine: NEGATIVE ng/mL
BENZODIAZ UR QL: NEGATIVE ng/mL
Barbiturate: NEGATIVE ng/mL
Cannabinoid Quant, Ur: NEGATIVE ng/mL
Cocaine (Metabolite): NEGATIVE ng/mL
Creatinine: 25.7 mg/dL (ref 20.0–300.0)
Ethanol: NEGATIVE %
Meperidine: NEGATIVE ng/mL
Methadone Screen, Urine: NEGATIVE ng/mL
OPIATE SCREEN URINE: NEGATIVE ng/mL
Oxycodone/Oxymorphone, Urine: NEGATIVE ng/mL
Phencyclidine: NEGATIVE ng/mL
Propoxyphene: NEGATIVE ng/mL
Tramadol: NEGATIVE ng/mL
pH, Urine: 6.7 (ref 4.5–8.9)

## 2018-10-27 LAB — PROTEIN / CREATININE RATIO, URINE
Creatinine, Urine: 22.1 mg/dL
Protein, Ur: <4 mg/dL

## 2018-10-28 LAB — CULTURE, OB URINE

## 2018-10-28 LAB — GC/CHLAMYDIA PROBE AMP (~~LOC~~) NOT AT ARMC
Chlamydia: NEGATIVE
Neisseria Gonorrhea: NEGATIVE

## 2018-10-28 LAB — URINE CULTURE, OB REFLEX

## 2018-11-01 ENCOUNTER — Telehealth: Payer: Self-pay | Admitting: Radiology

## 2018-11-01 ENCOUNTER — Encounter: Payer: Self-pay | Admitting: Radiology

## 2018-11-01 NOTE — Telephone Encounter (Signed)
Left message to call [facility] for Panorama results

## 2018-11-03 ENCOUNTER — Encounter (HOSPITAL_COMMUNITY): Payer: Self-pay

## 2018-11-03 ENCOUNTER — Other Ambulatory Visit: Payer: Self-pay

## 2018-11-03 ENCOUNTER — Ambulatory Visit (HOSPITAL_COMMUNITY)
Admission: RE | Admit: 2018-11-03 | Discharge: 2018-11-03 | Disposition: A | Discharge status: Home / Self Care | Payer: Medicaid Other | Source: Ambulatory Visit | Attending: Obstetrics and Gynecology | Admitting: Obstetrics and Gynecology

## 2018-11-03 ENCOUNTER — Ambulatory Visit (HOSPITAL_COMMUNITY): Payer: Medicaid Other | Admitting: *Deleted

## 2018-11-03 DIAGNOSIS — O0990 Supervision of high risk pregnancy, unspecified, unspecified trimester: Secondary | ICD-10-CM | POA: Diagnosis not present

## 2018-11-03 DIAGNOSIS — O34219 Maternal care for unspecified type scar from previous cesarean delivery: Secondary | ICD-10-CM

## 2018-11-03 DIAGNOSIS — O09523 Supervision of elderly multigravida, third trimester: Secondary | ICD-10-CM | POA: Insufficient documentation

## 2018-11-03 DIAGNOSIS — Z3A33 33 weeks gestation of pregnancy: Secondary | ICD-10-CM

## 2018-11-04 ENCOUNTER — Other Ambulatory Visit (HOSPITAL_COMMUNITY): Payer: Self-pay | Admitting: *Deleted

## 2018-11-04 DIAGNOSIS — O359XX0 Maternal care for (suspected) fetal abnormality and damage, unspecified, not applicable or unspecified: Secondary | ICD-10-CM

## 2018-11-05 ENCOUNTER — Encounter (HOSPITAL_COMMUNITY): Payer: Self-pay | Admitting: Pediatric Cardiology

## 2018-11-05 DIAGNOSIS — O359XX1 Maternal care for (suspected) fetal abnormality and damage, unspecified, fetus 1: Secondary | ICD-10-CM | POA: Diagnosis not present

## 2018-11-05 DIAGNOSIS — Z3A20 20 weeks gestation of pregnancy: Secondary | ICD-10-CM | POA: Diagnosis not present

## 2018-11-09 ENCOUNTER — Encounter: Payer: Self-pay | Admitting: Obstetrics & Gynecology

## 2018-11-09 ENCOUNTER — Ambulatory Visit (INDEPENDENT_AMBULATORY_CARE_PROVIDER_SITE_OTHER): Payer: Medicaid Other | Admitting: Obstetrics & Gynecology

## 2018-11-09 ENCOUNTER — Other Ambulatory Visit: Payer: Self-pay

## 2018-11-09 VITALS — BP 128/85 | HR 80

## 2018-11-09 DIAGNOSIS — O0990 Supervision of high risk pregnancy, unspecified, unspecified trimester: Secondary | ICD-10-CM

## 2018-11-09 DIAGNOSIS — O358XX0 Maternal care for other (suspected) fetal abnormality and damage, not applicable or unspecified: Secondary | ICD-10-CM | POA: Insufficient documentation

## 2018-11-09 DIAGNOSIS — Z3A34 34 weeks gestation of pregnancy: Secondary | ICD-10-CM

## 2018-11-09 DIAGNOSIS — O35BXX0 Maternal care for other (suspected) fetal abnormality and damage, fetal cardiac anomalies, not applicable or unspecified: Secondary | ICD-10-CM | POA: Insufficient documentation

## 2018-11-09 DIAGNOSIS — O0993 Supervision of high risk pregnancy, unspecified, third trimester: Secondary | ICD-10-CM

## 2018-11-09 NOTE — Progress Notes (Signed)
Pt declined flu shot 09.08.2020

## 2018-11-09 NOTE — Progress Notes (Addendum)
PRENATAL VISIT NOTE  Subjective:  Holly Gordon is a 38 y.o. [id number] at [redacted]w[redacted]d being seen today for ongoing prenatal care.  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  Patient reports no complaints. Worried about fetal cardiac anomaly (see summary below), was told she will need to be transferred to [facility] for delivery.  Contractions: Not present. Vag. Bleeding: None.  Movement: Present. Denies leaking of fluid.   The following portions of the patient's history were reviewed and updated as appropriate: allergies, current medications, past family history, past medical history, past social history, past surgical history and problem list.   Objective:   Vitals:   11/09/18 1615  BP: 128/85  Pulse: 80    Fetal Status: Fetal Heart Rate (bpm): 144   Movement: Present     General:  Alert, oriented and cooperative. Patient is in no acute distress.  Skin: Skin is warm and dry. No rash noted.   Cardiovascular: Normal heart rate noted  Respiratory: Normal respiratory effort, no problems with respiration noted  Abdomen: Soft, gravid, appropriate for gestational age.  Pain/Pressure: Absent     Pelvic: Cervical exam deferred        Extremities: Normal range of motion.  Edema: None  Mental Status: Normal mood and affect. Normal behavior. Normal judgment and thought content.   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  [place] Mfm Ob Detail +14 Wk  Result Date: 11/03/2018 ----------------------------------------------------------------------  OBSTETRICS REPORT                       (Signed Final 11/03/2018 04:40 pm) ---------------------------------------------------------------------- Patient Info  ID #:       [id number]                          D.O.B.:  Feb 13, 1981 (38 yrs)  Name:       Holly Gordon [facility]            Visit Date: 11/03/2018 02:58 pm ---------------------------------------------------------------------- Performed By  Performed By:     Earley Brooke     Ref. Address:     [address]                    [address]  Attending:        Sander Nephew      [facility]                    MD                                       [facility]etal [facility]  Referred By:      [place] ---------------------------------------------------------------------- Orders   #  Description                          Code         Ordered By   1  [place] MFM OB DETAIL +14 [place]              [id number]     Aletha Halim  ----------------------------------------------------------------------   #  Order #                    Accession #                 Episode #   1  [id number]                  [id number]                   [id number]  ---------------------------------------------------------------------- Indications   Encounter for antenatal screening for          Z36.3   malformations (LOW risk NIPS)   [redacted] weeks gestation of pregnancy                Z3A.38   Advanced maternal age multigravida 65+,        O35.522   second trimester   Previous cesarean delivery, antepartum x 2     O34.219  ---------------------------------------------------------------------- Fetal Evaluation  Num Of Fetuses:         1  Fetal Heart Rate(bpm):  137  Cardiac Activity:       Observed  Presentation:           Cephalic  Placenta:               Posterior  P. Cord Insertion:      Visualized  Amniotic Fluid  AFI FV:      Within normal limits  AFI Sum(cm)     %Tile       Largest Pocket(cm)  21.65           82          6.74  RUQ(cm)       RLQ(cm)       LUQ(cm)        LLQ(cm)  6.74          5.76          5.11           4.04 ---------------------------------------------------------------------- Biometry  BPD:      83.5  mm     G. Age:  33w 4d         43  %    CI:        77.98   %    70 - 86  FL/HC:      19.9   %    19.4 - 21.8  HC:      299.2  mm     G. Age:  33w 1d          8  %    HC/AC:      0.99        0.96 - 1.11  AC:      303.5  mm     G. Age:  34w 2d         70  %    FL/BPD:     71.4   %    71 - 87  FL:       59.6  mm     G. Age:  31w 0d          1  %    FL/AC:      19.6   %    20 - 24  HUM:      52.2  mm     G. Age:  30w 3d        < 5  %  LV:        2.8  mm  Est. FW:    2145  gm    4 lb 12 oz      28  % ---------------------------------------------------------------------- OB History  Gravidity:    7         Term:   3        Prem:   0        SAB:   0  TOP:          0       Ectopic:  0        Living: 2 ---------------------------------------------------------------------- Gestational Age  LMP:           33w 5d        Date:  03/12/18                 EDD:   12/17/18  U/S Today:     33w 0d                                         EDD:   12/22/18  Best:          33w 5d     Det. By:  LMP  (03/12/18)          EDD:   12/17/18 ---------------------------------------------------------------------- Anatomy  Cranium:               Appears normal         Diaphragm:              Appears normal  Cavum:                 Appears normal         Stomach:                Appears normal, left  sided  Ventricles:            Appears normal         Abdomen:                Appears normal  Choroid Plexus:        Appears normal         Abdominal Wall:         Not well visualized  Cerebellum:            Appears normal         Cord Vessels:           Appears normal (3                                                                        vessel cord)  Posterior Fossa:       Appears normal         Kidneys:                Appear normal  Nuchal Fold:           Not applicable (>20    Bladder:                Appears normal                         wks GA)  Face:                  Not well visualized    Spine:                  Limited views                                                                        appear normal  Lips:                  Not well visualized    Upper Extremities:      Visualized  Palate:                Not well visualized    Lower Extremities:      Visualized  Heart:                 Abnormal, see                         comments  Other:  Gender not well visualized. Technically difficult due to advanced GA          and fetal position. ---------------------------------------------------------------------- Cervix Uterus Adnexa  Cervix  Not visualized (advanced GA >24wks)  Left Ovary  Within normal limits.  Right Ovary  Within normal limits.  Adnexa  No abnormality visualized. ---------------------------------------------------------------------- Impression  Normal growth  We observed small right ventricle with a thickened right  ventricle. The  RVOT was seen but could not observe the  pulmonary artery  bifurcation. The aorta appeared enlarged as  well as the left ventricle. I discussed today's findings with Ms.  Gaetz, he differential diagnosis would include a normal  finding limited views secondary to advanced gestational,  pulmonary atresia, and balanced AVSD canal defect. There  was no obvious septal defect.  Suboptimal views of fetal anatomy was limited to advanced  gestational age and maternal habitus.  I discussed today's findings and managent plan with Ms.  Pillars ---------------------------------------------------------------------- Recommendations  Fetal echocardiogram ordered  Follow up growth in 4 weeks. ----------------------------------------------------------------------               Lin Landsmanorenthian Booker, MD Electronically Signed Final Report   11/03/2018 04:40 pm ----------------------------------------------------------------------   Assessment and Plan:  Pregnancy: [id number] at 3325w4d 1. 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 Supervision of high risk pregnancy, antepartum She has appointment with [facility] this month, will follow up recommendations especially regarding transfer of care. They will facilitate this as per their notes. Support given to her. She is appropriately sad, no other concerning symptoms.  Preterm labor symptoms and general obstetric precautions including but not limited to vaginal bleeding, contractions, leaking of fluid and fetal movement were reviewed in detail with the patient. Please refer to After Visit Summary for other counseling recommendations.   Return in about 2 weeks (around 11/23/2018) for OFFICE OB Visit, Pelvic cultures (in case her care is not transferred by then).    Jaynie CollinsUgonna Tavion Senkbeil, MD

## 2018-11-09 NOTE — Patient Instructions (Signed)
Return to office for any scheduled appointments. Call the office or go to the MAU at [facility] if:  You begin to have strong, frequent contractions  Your water breaks.  Sometimes it is a big gush of fluid, sometimes it is just a trickle that keeps getting your panties wet or running down your legs  You have vaginal bleeding.  It is normal to have a small amount of spotting if your cervix was checked.   You do not feel your baby moving like normal.  If you do not, get something to eat and drink and lay down and focus on feeling your baby move.   If your baby is still not moving like normal, you should call the office or go to MAU.  Any other obstetric concerns.   

## 2018-11-13 IMAGING — US US OB COMP LESS 14 WK
1 series · 15 of 28 positions shown · non-contrast
Comparison: None.

CLINICAL DATA: Abdominal cramping for 4 days

EXAM:
OBSTETRIC <14 WK US AND TRANSVAGINAL OB US
TECHNIQUE: Both transabdominal and transvaginal ultrasound examinations were
performed for complete evaluation of the gestation as well as the
maternal uterus, adnexal regions, and pelvic cul-de-sac.
Transvaginal technique was performed to assess early pregnancy.

[Series 1: us ob comp less 14 wk · 132 acquisitions, 15 frames shown]
[im 1/132]
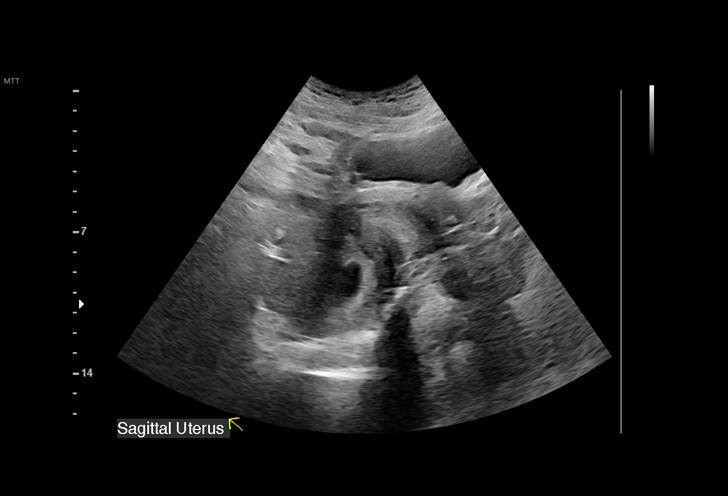
[im 10/132]
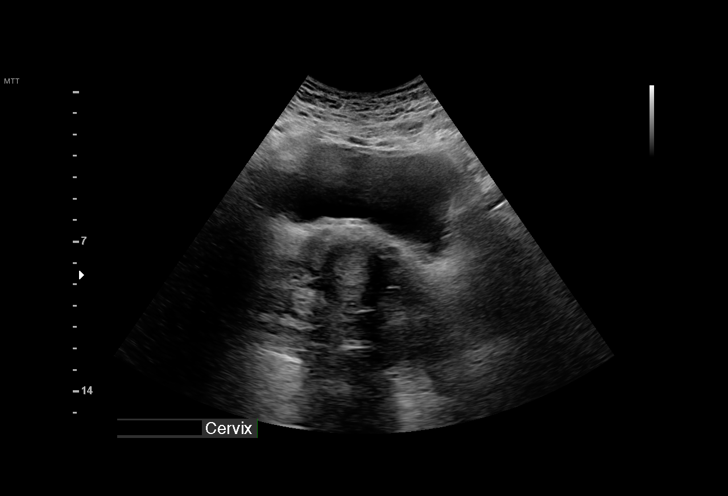
[im 20/132]
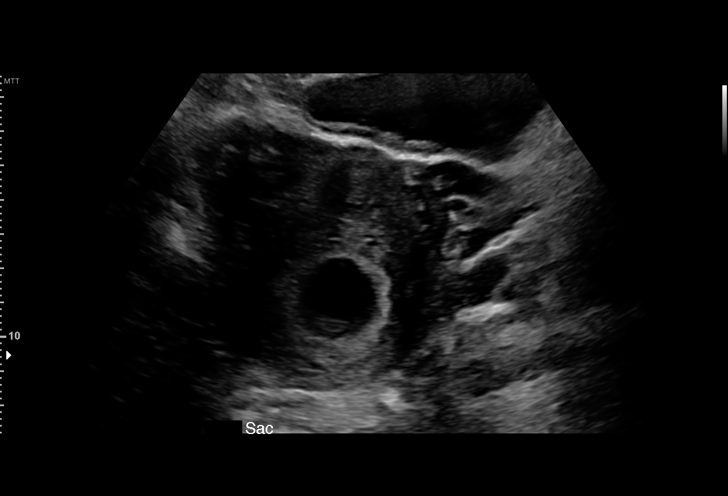
[im 30/132]
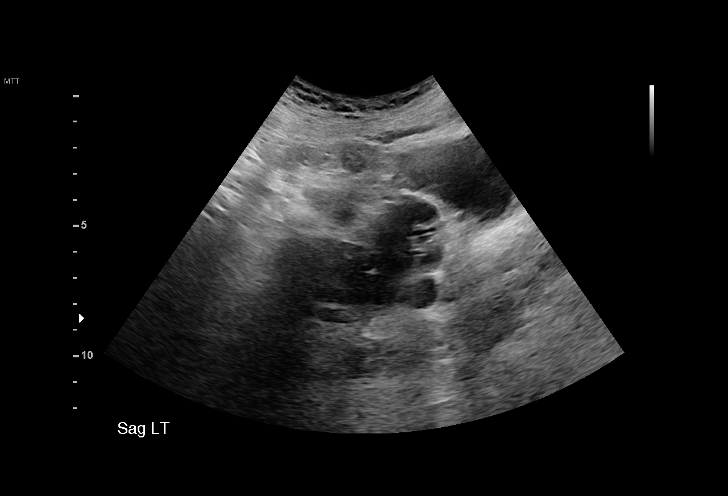
[im 39/132]
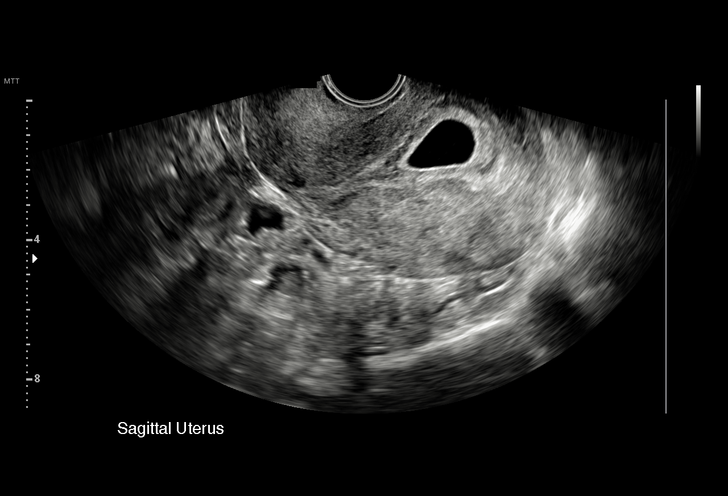
[im 49/132]
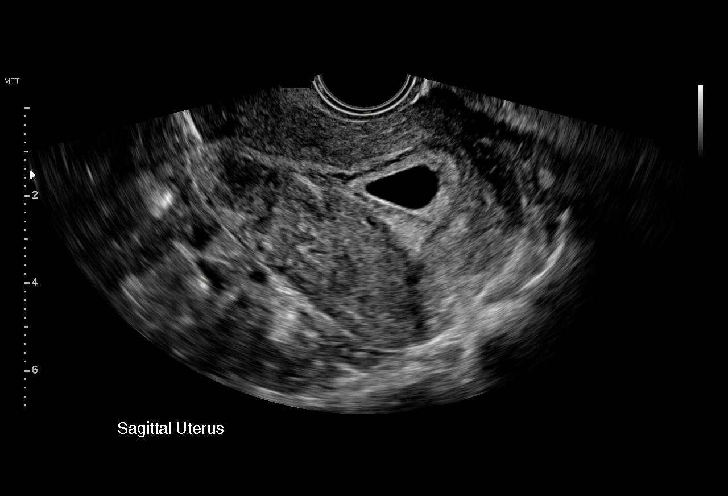
[im 59/132]
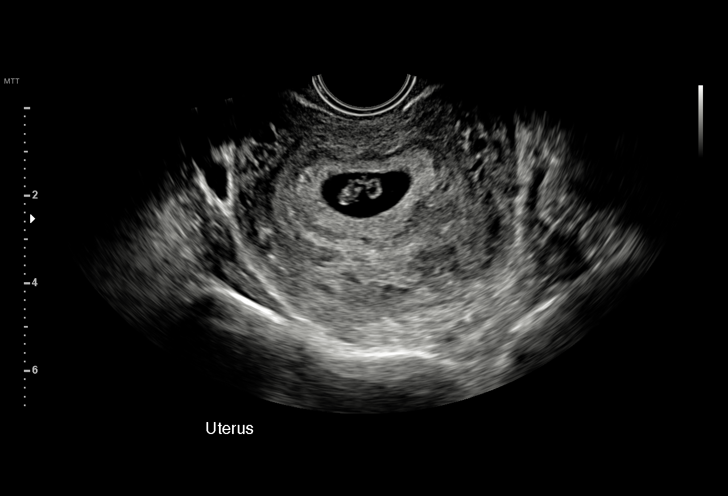
[im 68/132]
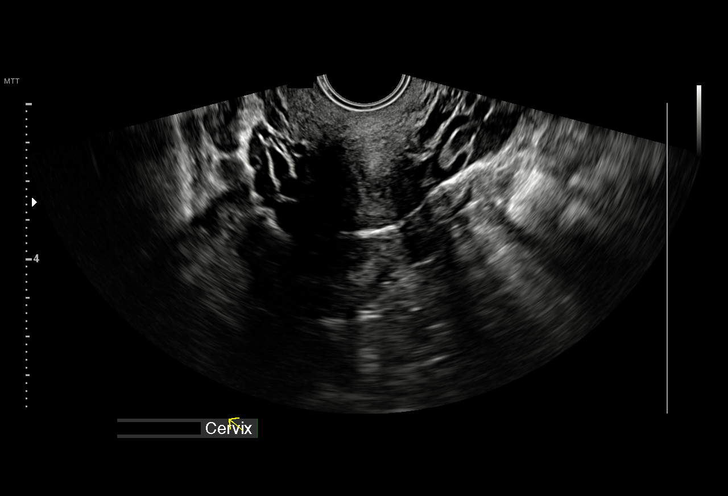
[im 73/132]
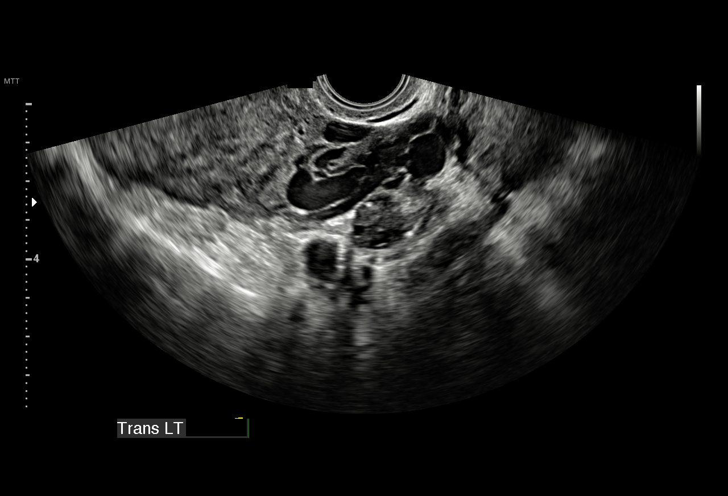
[im 83/132]
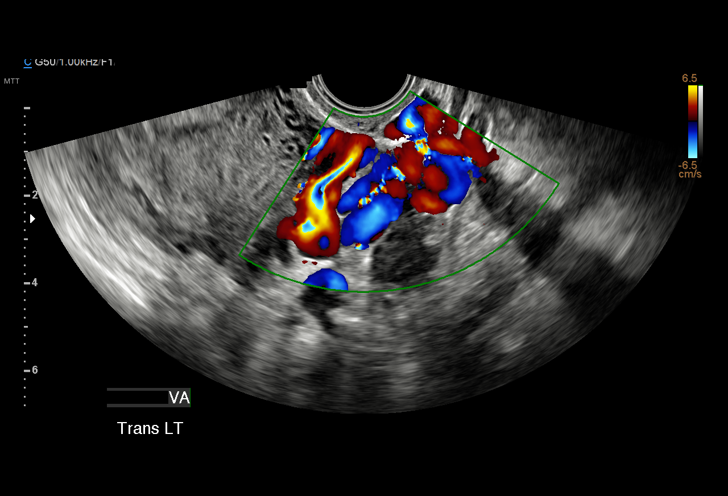
[im 93/132]
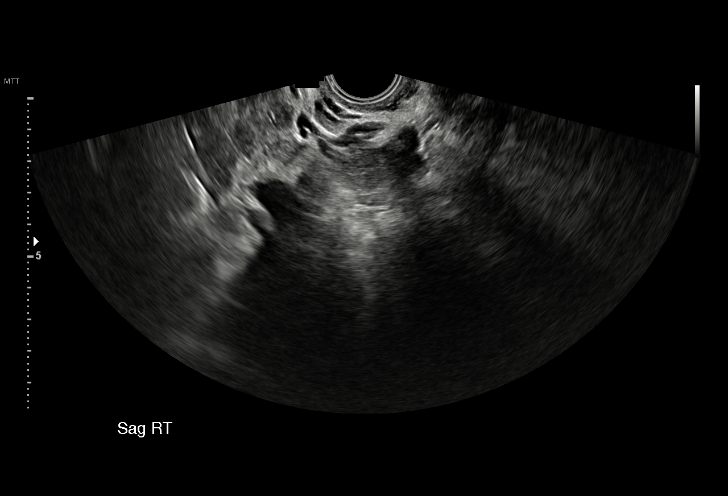
[im 102/132]
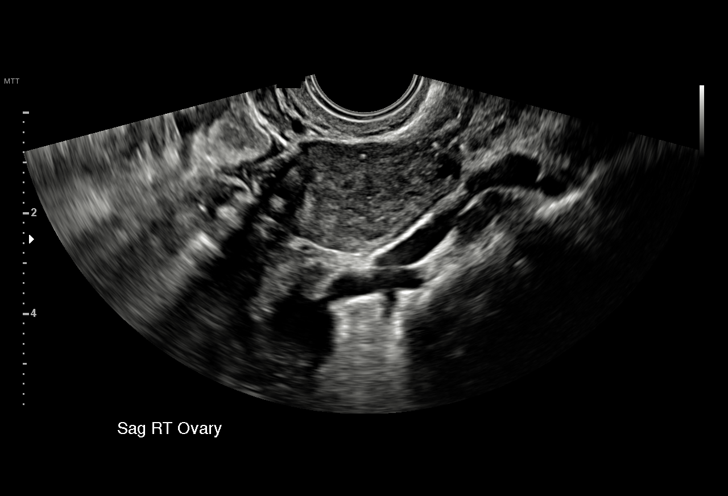
[im 112/132]
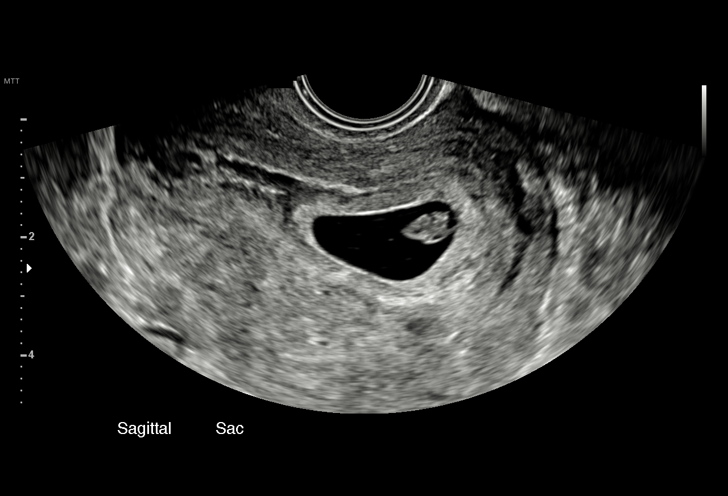
[im 122/132]
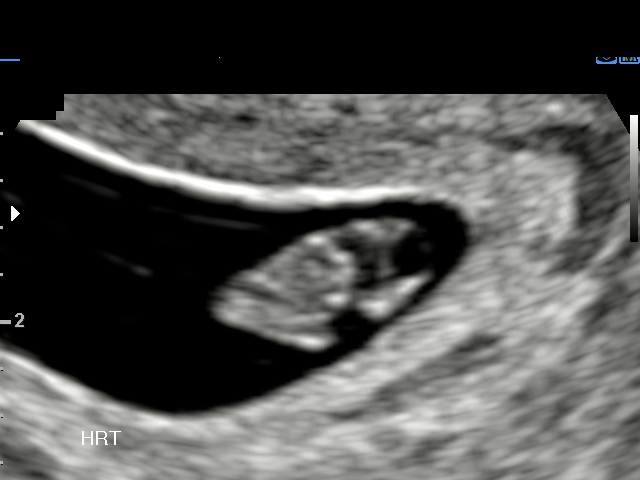
[im 132/132]
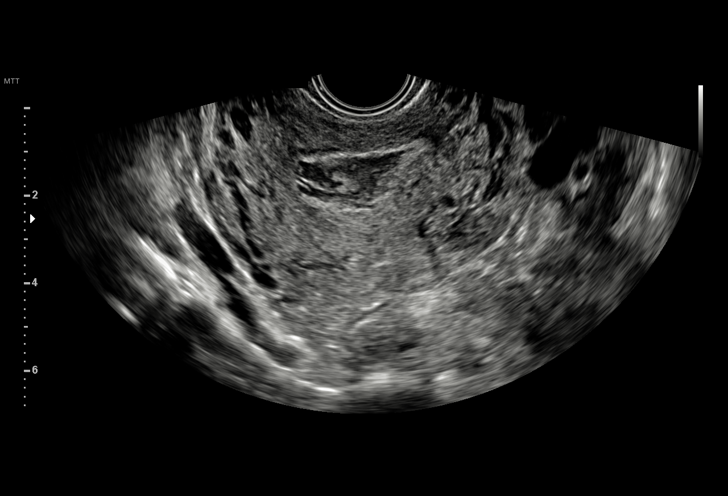

[15 of 28 positions shown; findings below may reference images not displayed]

FINDINGS: Intrauterine gestational sac: Single intrauterine gestation

Yolk sac:  Visualized

Embryo:  Visualized

Cardiac Activity: Visualized

Heart Rate: 140  bpm

CRL:  10  mm   7 w   1 d                  US EDC: 04/12/2017

Subchorionic hemorrhage: Possible small subchorionic hemorrhage
right posterior sac.

Maternal uterus/adnexae: Ovaries are within normal limits. The left
ovary measures 3.1 x 1.3 x 1.8 cm. The right ovary measures 3 x
by 3.2 cm. Trace free fluid in the pelvis. Dilated left pelvic/
adnexal vessels.
IMPRESSION: 1. Single viable intrauterine gestation as above
2. Possible small subchorionic hemorrhage.
3. Trace free fluid in the pelvis

## 2018-11-19 DIAGNOSIS — Q249 Congenital malformation of heart, unspecified: Secondary | ICD-10-CM | POA: Diagnosis not present

## 2018-11-19 DIAGNOSIS — Z3A00 Weeks of gestation of pregnancy not specified: Secondary | ICD-10-CM | POA: Diagnosis not present

## 2018-11-19 DIAGNOSIS — O0930 Supervision of pregnancy with insufficient antenatal care, unspecified trimester: Secondary | ICD-10-CM | POA: Diagnosis not present

## 2018-11-19 DIAGNOSIS — Z363 Encounter for antenatal screening for malformations: Secondary | ICD-10-CM | POA: Diagnosis not present

## 2018-11-19 DIAGNOSIS — O99413 Diseases of the circulatory system complicating pregnancy, third trimester: Secondary | ICD-10-CM | POA: Diagnosis not present

## 2018-11-19 DIAGNOSIS — O358XX0 Maternal care for other (suspected) fetal abnormality and damage, not applicable or unspecified: Secondary | ICD-10-CM | POA: Diagnosis not present

## 2018-11-19 DIAGNOSIS — Z3A36 36 weeks gestation of pregnancy: Secondary | ICD-10-CM | POA: Diagnosis not present

## 2018-11-19 DIAGNOSIS — O0933 Supervision of pregnancy with insufficient antenatal care, third trimester: Secondary | ICD-10-CM | POA: Diagnosis not present

## 2018-11-23 ENCOUNTER — Encounter: Payer: Medicaid Other | Admitting: Obstetrics & Gynecology

## 2018-11-25 ENCOUNTER — Encounter (HOSPITAL_COMMUNITY): Payer: Self-pay | Admitting: Obstetrics and Gynecology

## 2018-11-25 DIAGNOSIS — Z8659 Personal history of other mental and behavioral disorders: Secondary | ICD-10-CM | POA: Diagnosis not present

## 2018-11-25 DIAGNOSIS — Z98891 History of uterine scar from previous surgery: Secondary | ICD-10-CM | POA: Diagnosis not present

## 2018-11-25 DIAGNOSIS — O0991 Supervision of high risk pregnancy, unspecified, first trimester: Secondary | ICD-10-CM | POA: Diagnosis not present

## 2018-11-25 DIAGNOSIS — O358XX0 Maternal care for other (suspected) fetal abnormality and damage, not applicable or unspecified: Secondary | ICD-10-CM | POA: Diagnosis not present

## 2018-11-25 DIAGNOSIS — O0993 Supervision of high risk pregnancy, unspecified, third trimester: Secondary | ICD-10-CM | POA: Diagnosis not present

## 2018-11-26 ENCOUNTER — Encounter (HOSPITAL_COMMUNITY): Payer: Self-pay | Admitting: Obstetrics and Gynecology

## 2018-12-01 ENCOUNTER — Ambulatory Visit (HOSPITAL_COMMUNITY): Payer: Medicaid Other

## 2018-12-01 ENCOUNTER — Encounter (HOSPITAL_COMMUNITY): Payer: Self-pay

## 2018-12-03 ENCOUNTER — Ambulatory Visit
Admission: EM | Admit: 2018-12-03 | Discharge: 2018-12-03 | Disposition: A | Discharge status: Home / Self Care | Payer: Medicaid Other | Attending: Family Medicine | Admitting: Family Medicine

## 2018-12-03 ENCOUNTER — Other Ambulatory Visit: Payer: Self-pay

## 2018-12-03 ENCOUNTER — Encounter: Payer: Self-pay | Admitting: Emergency Medicine

## 2018-12-03 DIAGNOSIS — Z7189 Other specified counseling: Secondary | ICD-10-CM

## 2018-12-03 DIAGNOSIS — Z3A38 38 weeks gestation of pregnancy: Secondary | ICD-10-CM | POA: Diagnosis not present

## 2018-12-03 DIAGNOSIS — J40 Bronchitis, not specified as acute or chronic: Secondary | ICD-10-CM

## 2018-12-03 DIAGNOSIS — F17200 Nicotine dependence, unspecified, uncomplicated: Secondary | ICD-10-CM

## 2018-12-03 DIAGNOSIS — Z20828 Contact with and (suspected) exposure to other viral communicable diseases: Secondary | ICD-10-CM | POA: Insufficient documentation

## 2018-12-03 DIAGNOSIS — Z20822 Contact with and (suspected) exposure to covid-19: Secondary | ICD-10-CM

## 2018-12-03 MED ORDER — AZITHROMYCIN 250 MG PO TABS: ORAL_TABLET | ORAL | 0 refills | Status: DC

## 2018-12-03 NOTE — ED Provider Notes (Addendum)
[place], [place]   Name: Holly Gordon DOB: 04/09/1980 MRN: [id number] CSN: [id number] PCP: Patient, No Pcp Per  Arrival date and time:  12/03/18 1500  Chief Complaint:  Cough   NOTE: Prior to seeing the patient today, I have reviewed the triage nursing documentation and vital signs. Clinical staff has updated patient's PMH/PSHx, current medication list, and drug allergies/intolerances to ensure comprehensive history available to assist in medical decision making.   History:   HPI: Holly Gordon is a 38 y.o. female who presents today with complaints of a deep "chest cough" that has been progressively worsening since 11/25/2018. Patient reports that her symptoms started out with mild nasal congestion. She has not experienced any fevers. Patient with hoarse voice quality. Patient is currently a 38 week G4P3A0 [facility]. She is scheduled to deliver by Caesarean section on 12/13/2018. Patient had a virtual visit with her OB/GYN today and was advised to come in for further evaluation of her symptoms. Patient states, "they told me that I needed to be better before I delivered. This has been going on for too long for it just to be a cough or my allergies". Patient denies any nausea, vomiting, diarrhea, abdominal pain, and vaginal bleeding/discharge. She is able to feel her baby moving normally. FHTs assessed by nursing prior to provider assessment and found to be 141 bpm. In efforts to conservatively manage her symptoms at home, the patient notes that she has used Robitussin, Delsym, Nyquil, and diphenhydramine, which have only minimally helped to improve her symptoms. Of note, patient's brother lives with her and was recently diagnosed with a bacterial respiratory infection. Her brother has been tested for SARS-CoV-2 (novel coronavirus) and found to be negative. The patient advises that she has never been tested for the virus.    Past Medical History:  Diagnosis Date  .  Anemia   . Anxiety   . Bacterial vaginitis 08/26/2016  . Depression    had counseling in the passt no meds  . History of abnormal cervical Pap smear     Past Surgical History:  Procedure Laterality Date  . CESAREAN SECTION  2008   x1  . CESAREAN SECTION N/A 04/03/2017   Procedure: CESAREAN SECTION;  Surgeon: Hermina StaggersErvin, Michael L, MD;  [facility];  Service: Obstetrics;  Laterality: N/A;  . COLPOSCOPY     abnormal pap  . INDUCED ABORTION    . TONSILLECTOMY    . WISDOM TOOTH EXTRACTION     x4    Family History  Problem Relation Age of Onset  . Hypertension Father   . Diabetes Father   . Kidney disease Maternal Grandmother   . Heart disease Maternal Grandmother   . Alzheimer's disease Maternal Grandfather     Social History   Tobacco Use  . Smoking status: Current Every Day Smoker    Packs/day: 0.10    Types: Cigarettes  . Smokeless tobacco: Never Used  Substance Use Topics  . Alcohol use: No  . Drug use: No    Comment: uses vape a month ago    Patient Active Problem List   Diagnosis Date Noted  . 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 Supervision of high risk pregnancy, antepartum 10/26/2018  . Late prenatal care 10/26/2018  . Multigravida of advanced maternal age in third trimester 10/26/2018  . Hypothyroidism 05/13/2017  . History of cesarean delivery 04/03/2017  . Marijuana abuse 09/18/2016  . Cervical high risk HPV (  human papillomavirus) test positive with normal cytology 08/2016 09/15/2016  . Body mass index (BMI) of 25.0 to 25.9 in adult 09/09/2016  . History of substance abuse (HCC) 09/09/2016  . Tobacco abuse 09/09/2016  . History of depression 09/09/2016    Home Medications:    Current Meds  Medication Sig  . calcium carbonate (TUMS - DOSED IN MG ELEMENTAL CALCIUM) 500 MG chewable tablet Chew 1 tablet by mouth daily.  . diphenhydrAMINE (BENADRYL) 12.5 MG chewable tablet Chew 12.5 mg by mouth 4  (four) times daily as needed for allergies.  . Prenatal Vit-Fe Fumarate-FA (PRENATAL VITAMINS PO) Take by mouth.    Allergies:   Tamiflu [oseltamivir phosphate], Morphine and related, and Sulfa antibiotics  Review of Systems (ROS): Review of Systems  Constitutional: Positive for fatigue. Negative for chills and fever.       38 week G4P3A0  HENT: Positive for congestion, rhinorrhea and voice change. Negative for ear pain, postnasal drip, sinus pressure, sinus pain, sneezing and sore throat.   Eyes: Negative for pain, discharge and redness.  Respiratory: Positive for cough. Negative for chest tightness and shortness of breath.   Cardiovascular: Negative for chest pain and palpitations.  Gastrointestinal: Negative for abdominal pain, diarrhea, nausea and vomiting.  Genitourinary: Negative for dysuria, hematuria, pelvic pain, vaginal bleeding, vaginal discharge and vaginal pain.       Voiding per her baseline habits.  Musculoskeletal: Negative for arthralgias, back pain, myalgias and neck pain.  Skin: Negative for color change, pallor and rash.  Neurological: Negative for dizziness, syncope, weakness and headaches.  Hematological: Negative for adenopathy.  Psychiatric/Behavioral: The patient is nervous/anxious.      Vital Signs: Today's Vitals   12/03/18 1511 12/03/18 1515  BP:  (!) 135/99  Pulse:  92  Resp:  16  Temp:  98.6 F (37 C)  TempSrc:  Oral  SpO2:  97%  Weight: 190 lb (86.2 kg)   Height: 5\' 4"  (1.626 m)   PainSc: 0-No pain     Physical Exam: Physical Exam  Constitutional: She is oriented to person, place, and time and well-developed, well-nourished, and in no distress. She appears not dehydrated.  Non-toxic appearance. She has a sickly appearance (acutely ill appearing). No distress.  HENT:  Head: Normocephalic and atraumatic.  Right Ear: Tympanic membrane normal.  Left Ear: Tympanic membrane normal.  Nose: Mucosal edema and rhinorrhea present. No sinus  tenderness.  Mouth/Throat: Uvula is midline. Posterior oropharyngeal erythema present. No posterior oropharyngeal edema.  Eyes: Pupils are equal, round, and reactive to light. Conjunctivae and EOM are normal.  Neck: Normal range of motion. Neck supple. No tracheal deviation present.  Cardiovascular: Normal rate, regular rhythm, normal heart sounds and intact distal pulses. Exam reveals no gallop and no friction rub.  No murmur heard. Pulmonary/Chest: Effort normal. No accessory muscle usage. No respiratory distress. She has no decreased breath sounds. She has no wheezes. She has rhonchi (scattered). She has no rales.  Abdominal: Soft. Bowel sounds are normal. There is no abdominal tenderness.  Gravid abdomen. FHTs 141 bpm  Musculoskeletal: Normal range of motion.  Lymphadenopathy:    She has no cervical adenopathy.  Neurological: She is alert and oriented to person, place, and time. Gait normal.  Skin: Skin is warm and dry. No rash noted. She is not diaphoretic.  Psychiatric: Memory, affect and judgment normal. Her mood appears anxious.  Nursing note and vitals reviewed.   Urgent Care Treatments / Results:   LABS: PLEASE NOTE: all labs that  were ordered this encounter are listed, however only abnormal results are displayed. Labs Reviewed  NOVEL CORONAVIRUS, NAA (HOSP ORDER, SEND-OUT TO REF LAB; TAT 18-24 HRS)    EKG: -None  RADIOLOGY: No results found.  PROCEDURES: Procedures  MEDICATIONS RECEIVED THIS VISIT: Medications - No data to display  PERTINENT CLINICAL COURSE NOTES/UPDATES:   Initial Impression / Assessment and Plan / Urgent Care Course:  Pertinent labs & imaging results that were available during my care of the patient were personally reviewed by me and considered in my medical decision making (see lab/imaging section of note for values and interpretations).  Holly Gordon is a 38 y.o. female who presents to [facility] today with complaints of Cough    Patient is acutely ill appearing (non-toxic) in clinic today. She does not appear to be in any acute distress. Presenting symptoms (see HPI) and exam as documented above. She presents with symptoms associated with SARS-CoV-2 (novel coronavirus). Discussed typical symptom constellation. Reviewed potential for infection and need for testing. Patient amenable to being tested. SARS-CoV-2 swab collected by certified clinical staff. Discussed variable turn around times associated with testing, as swabs are being processed at [facility], and have been taking between 2-5 days to come back. She was advised to self quarantine, per [facility] DHHS guidelines, until negative results received.   Prolonged cough and congestion for over a week. She has tried several OTC interventions without noted improvement. Patient currently [redacted] weeks pregnant and is scheduled to deliver via section on 12/13/2018 at [facility]. She had a virtual visit today with her OB/GYN as was advised to come in for evaluation and treatment. Ideally would like to obtain imaging of the chest, however due to pregnancy status, the risks outweigh the benefits given her current clinical condition. Exam is reassuring. Exam suggestive of bronchitis. She has been exposed to a sick family member with the same. Will proceed with treatment for acute bronchitis. Patient prone to vulvovaginal candidiasis with extended courses of antibiotics. Will treat with a  5 day course of oral azithromycin. Discussed supportive care measures at home during acute phase of illness. Patient to rest as much as possible. She was encouraged to ensure adequate hydration (water and ORS) to prevent dehydration and electrolyte derangements. Patient may use APAP on an as needed basis for pain/fever. Patient to return call to the clinic, or to her OB/GYN, if not improving.   Discussed follow up with PCP and/or GYN for re-evaluation if not improving by Monday. I have reviewed the follow up and strict  return precautions for any new or worsening symptoms. Patient is aware of symptoms that would be deemed urgent/emergent, and would thus require further evaluation either here or in the emergency department. At the time of discharge, she verbalized understanding and consent with the discharge plan as it was reviewed with her. All questions were fielded by provider and/or clinic staff prior to patient discharge.    Final Clinical Impressions / Urgent Care Diagnoses:   Final diagnoses:  Bronchitis  Encounter for laboratory testing for COVID-19 virus  Advice given about COVID-19 virus infection    New Prescriptions:  [place] Controlled Substance Registry consulted? Not Applicable  Meds ordered this encounter  Medications  . azithromycin (ZITHROMAX) 250 MG tablet    Sig: Take 2 tablets (500 mg) for 1 day, then 1 tablet (250 mg) daily x 4 days.    Dispense:  6 each    Refill:  0    Recommended Follow up Care:  Patient encouraged to follow up with the following provider within the specified time frame, or sooner as dictated by the severity of her symptoms. As always, she was instructed that for any urgent/emergent care needs, she should seek care either here or in the emergency department for more immediate evaluation.  NOTE: This note was prepared using Scientist, clinical (histocompatibility and immunogenetics) along with smaller Lobbyist. Despite my best ability to proofread, there is the potential that transcriptional errors may still occur from this process, and are completely unintentional.    Verlee Monte, NP 12/04/18 2046

## 2018-12-03 NOTE — ED Triage Notes (Signed)
Patient c/o cough and chest congestion since Thursday.  Patient denies fevers.

## 2018-12-03 NOTE — Discharge Instructions (Addendum)
It was very nice seeing you today in clinic. Thank you for entrusting me with your care.   Rest and Increase fluid intake as much as possible. Water is always best, as sugar and caffeine containing fluids can cause you to become dehydrated. Try to incorporate electrolyte enriched fluids, such as Gatorade or Pedialyte, into your daily fluid intake.  Please utilize the medications that we discussed. Your prescriptions have been called in to your pharmacy. Continue the cough syrup as needed.   Tested for SARS-CoV-2 (novel coronavirus) today. Results have been taking a couple of days. Self quarantine at home until negative results have been received.   Make arrangements to follow up with your regular doctor in 1 week for re-evaluation if not improving. If your symptoms/condition worsens, please seek follow up care either here or in the ER. Please remember, our [place] providers are "right here with you" when you need [place].   Again, it was my pleasure to take care of you today. Thank you for choosing our clinic. I hope that you start to feel better quickly.   Honor Loh, MSN, APRN, FNP-C, CEN Advanced Practice Provider [facility]

## 2018-12-04 LAB — NOVEL CORONAVIRUS, NAA (HOSP ORDER, SEND-OUT TO REF LAB; TAT 18-24 HRS): SARS-CoV-2, NAA: NOT DETECTED

## 2018-12-06 ENCOUNTER — Encounter (HOSPITAL_COMMUNITY): Payer: Self-pay

## 2018-12-07 ENCOUNTER — Telehealth: Payer: Self-pay | Admitting: Emergency Medicine

## 2018-12-07 NOTE — Telephone Encounter (Signed)
Patient called back stating the antibiotic was not working. She took her last dose of the antibiotic this morning. Spoke with Honor Loh, NP regarding patient symptoms and that she continues to cough. Advised patient that if her cough is not responding to the antibiotic then it was more than likely viral. She stated if she still has a cough she will not be able to see her baby that is due to be born in 7 days. Advised patient to contact her OB doctor and she stated she already had and they told her to just stay on the course she was on. Patient then hung up on me.

## 2018-12-09 DIAGNOSIS — Z98891 History of uterine scar from previous surgery: Secondary | ICD-10-CM | POA: Diagnosis not present

## 2018-12-09 DIAGNOSIS — O0990 Supervision of high risk pregnancy, unspecified, unspecified trimester: Secondary | ICD-10-CM | POA: Diagnosis not present

## 2018-12-09 DIAGNOSIS — O0930 Supervision of pregnancy with insufficient antenatal care, unspecified trimester: Secondary | ICD-10-CM | POA: Diagnosis not present

## 2018-12-09 DIAGNOSIS — O358XX0 Maternal care for other (suspected) fetal abnormality and damage, not applicable or unspecified: Secondary | ICD-10-CM | POA: Diagnosis not present

## 2018-12-13 DIAGNOSIS — O368930 Maternal care for other specified fetal problems, third trimester, not applicable or unspecified: Secondary | ICD-10-CM | POA: Diagnosis not present

## 2018-12-13 DIAGNOSIS — O34211 Maternal care for low transverse scar from previous cesarean delivery: Secondary | ICD-10-CM | POA: Diagnosis not present

## 2018-12-13 DIAGNOSIS — Z3A39 39 weeks gestation of pregnancy: Secondary | ICD-10-CM | POA: Diagnosis not present

## 2018-12-13 MED ORDER — LIDOCAINE HCL 1 % IJ SOLN
0.50 | INTRAMUSCULAR | Status: DC
Start: ? — End: 2018-12-13

## 2018-12-13 MED ORDER — KETOROLAC TROMETHAMINE 15 MG/ML IJ SOLN
15.00 | INTRAMUSCULAR | Status: DC
Start: 2018-12-13 — End: 2018-12-13

## 2018-12-13 MED ORDER — LACTATED RINGERS IV SOLN
INTRAVENOUS | Status: DC
Start: ? — End: 2018-12-13

## 2018-12-13 MED ORDER — ACETAMINOPHEN 325 MG PO TABS
975.00 | ORAL_TABLET | ORAL | Status: DC
Start: 2018-12-15 — End: 2018-12-13

## 2018-12-13 MED ORDER — LIDOCAINE HCL 1 % IJ SOLN
1.00 | INTRAMUSCULAR | Status: DC
Start: ? — End: 2018-12-13

## 2018-12-13 MED ORDER — NALOXONE HCL 0.4 MG/ML IJ SOLN
0.40 | INTRAMUSCULAR | Status: DC
Start: ? — End: 2018-12-13

## 2018-12-13 MED ORDER — IBUPROFEN 600 MG PO TABS
600.00 | ORAL_TABLET | ORAL | Status: DC
Start: 2018-12-14 — End: 2018-12-13

## 2018-12-13 MED ORDER — OXYCODONE HCL 5 MG PO TABS
10.00 | ORAL_TABLET | ORAL | Status: DC
Start: ? — End: 2018-12-13

## 2018-12-13 MED ORDER — OXYCODONE HCL 5 MG PO TABS
5.00 | ORAL_TABLET | ORAL | Status: DC
Start: ? — End: 2018-12-13

## 2018-12-14 MED ORDER — OXYTOCIN-SODIUM CHLORIDE 20-0.9 UT/500ML-% IV SOLN
10.00 | INTRAVENOUS | Status: DC
Start: ? — End: 2018-12-14

## 2018-12-14 MED ORDER — FENTANYL CITRATE (PF) 50 MCG/ML IJ SOLN
25.00 | INTRAMUSCULAR | Status: DC
Start: ? — End: 2018-12-14

## 2018-12-14 MED ORDER — FENTANYL CITRATE (PF) 50 MCG/ML IJ SOLN
50.00 | INTRAMUSCULAR | Status: DC
Start: ? — End: 2018-12-14

## 2018-12-14 MED ORDER — METHYLERGONOVINE MALEATE 0.2 MG/ML IJ SOLN
0.20 | INTRAMUSCULAR | Status: DC
Start: ? — End: 2018-12-14

## 2018-12-14 MED ORDER — PNV PRENATAL PLUS MULTIVITAMIN 27-1 MG PO TABS
1.00 | ORAL_TABLET | ORAL | Status: DC
Start: 2018-12-15 — End: 2018-12-14

## 2018-12-14 MED ORDER — SIMETHICONE 80 MG PO CHEW
80.00 | CHEWABLE_TABLET | ORAL | Status: DC
Start: ? — End: 2018-12-14

## 2018-12-14 MED ORDER — HYDROMORPHONE HCL 1 MG/ML IJ SOLN
0.20 | INTRAMUSCULAR | Status: DC
Start: ? — End: 2018-12-14

## 2018-12-14 MED ORDER — ALUM & MAG HYDROXIDE-SIMETH 400-400-40 MG/5ML PO SUSP
15.00 | ORAL | Status: DC
Start: ? — End: 2018-12-14

## 2018-12-14 MED ORDER — ONDANSETRON HCL 4 MG/2ML IJ SOLN
4.00 | INTRAMUSCULAR | Status: DC
Start: ? — End: 2018-12-14

## 2018-12-14 MED ORDER — GENERIC EXTERNAL MEDICATION
6.25 | Status: DC
Start: ? — End: 2018-12-14

## 2018-12-14 MED ORDER — DOCUSATE SODIUM 100 MG PO CAPS
100.00 | ORAL_CAPSULE | ORAL | Status: DC
Start: 2018-12-15 — End: 2018-12-14

## 2018-12-14 MED ORDER — DEXAMETHASONE SOD PHOSPHATE PF 10 MG/ML IJ SOLN
4.00 | INTRAMUSCULAR | Status: DC
Start: ? — End: 2018-12-14

## 2018-12-14 MED ORDER — MISOPROSTOL 200 MCG PO TABS
800.00 | ORAL_TABLET | ORAL | Status: DC
Start: ? — End: 2018-12-14

## 2018-12-16 ENCOUNTER — Encounter: Payer: Self-pay | Admitting: *Deleted

## 2018-12-19 IMAGING — US US MFM FETAL NUCHAL TRANSLUCENCY
1 series · 15 of 20 positions shown · non-contrast
Comparison: none

[Series 1: us mfm fetal nuchal translucency · 15 of 20 slices shown]
[im 1/20]
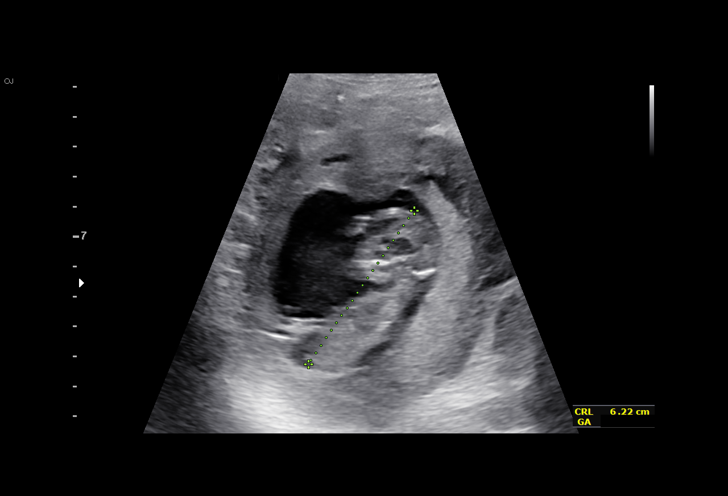
[im 3/20]
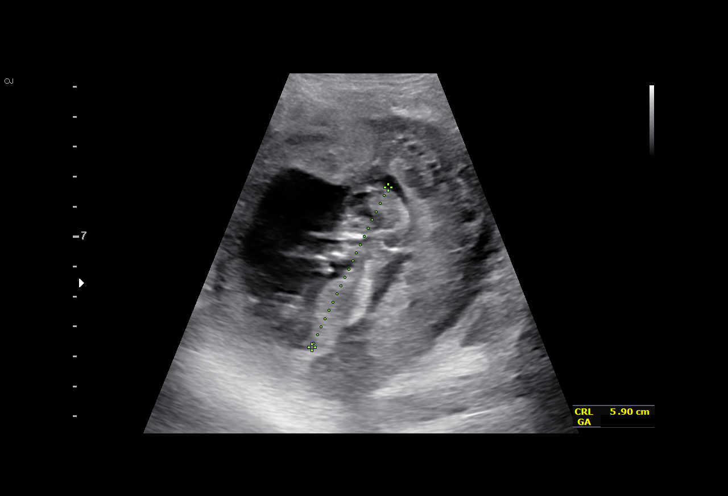
[im 4/20]
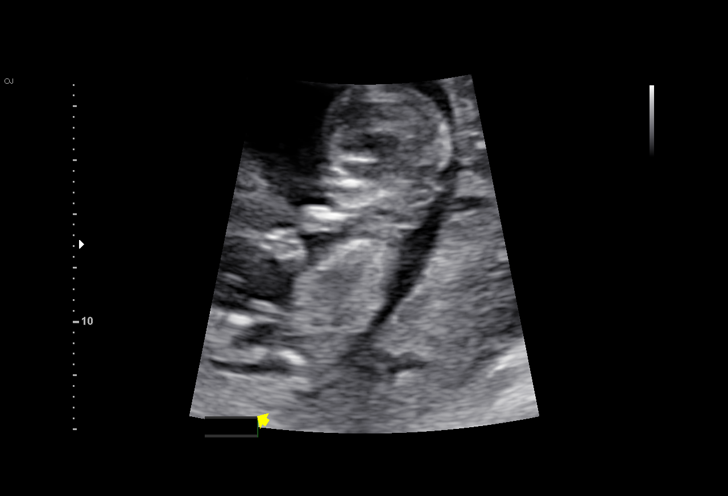
[im 5/20]
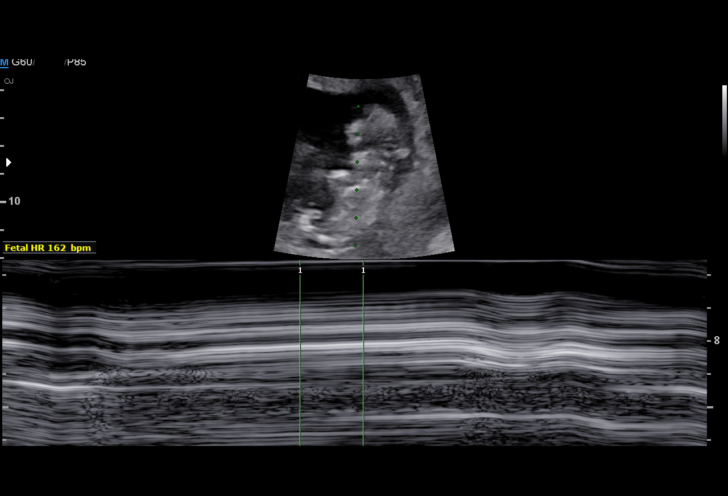
[im 7/20]
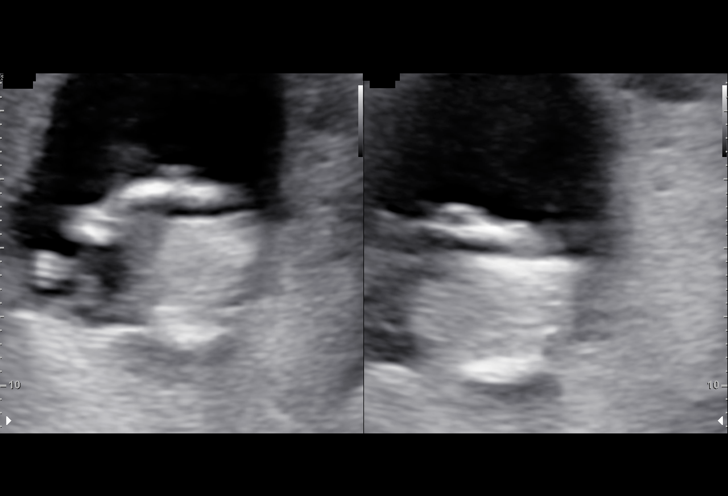
[im 8/20]
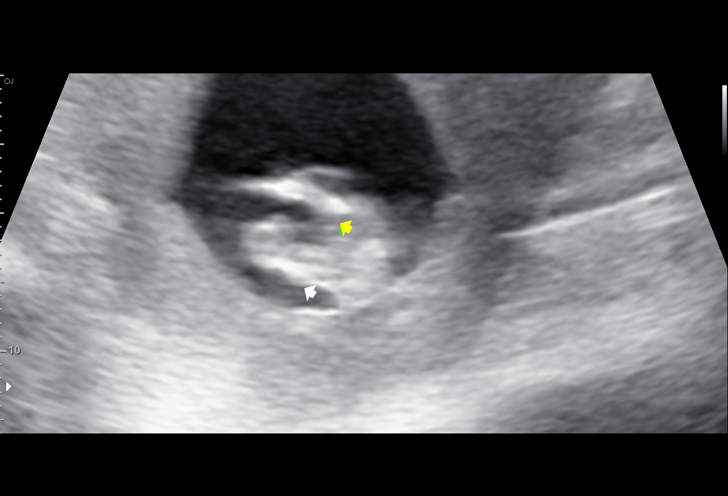
[im 9/20]
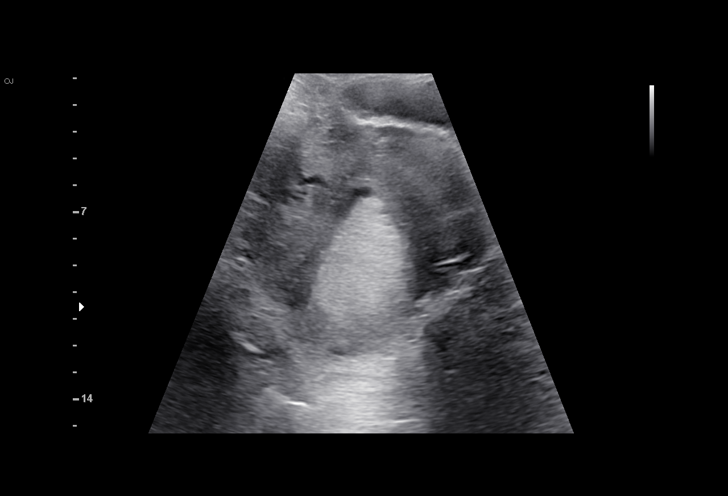
[im 11/20]
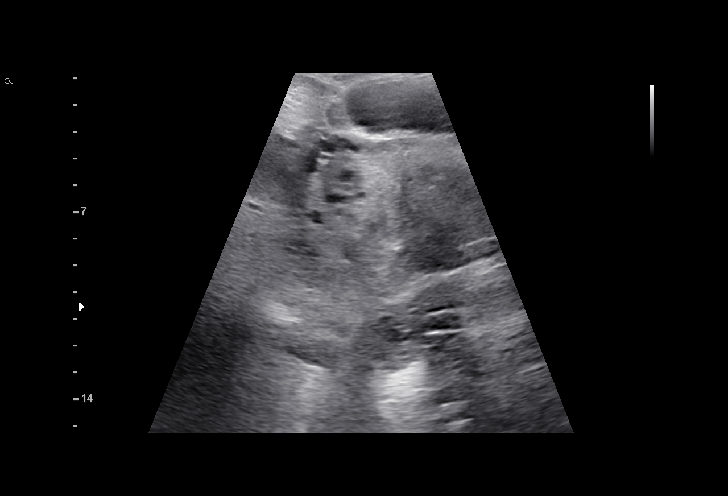
[im 12/20]
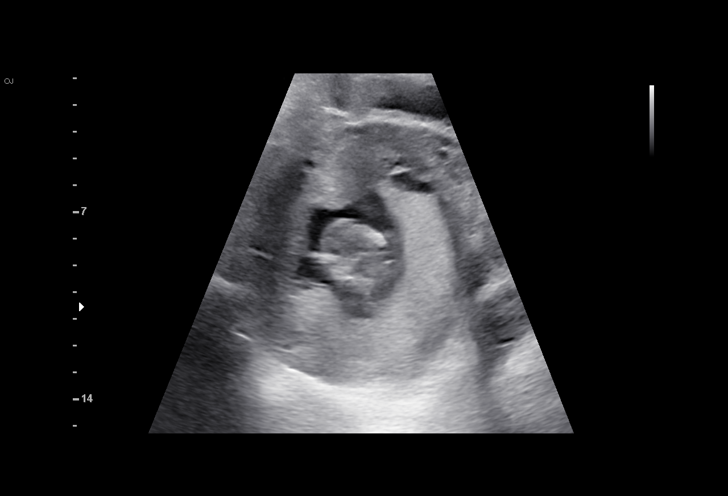
[im 13/20]
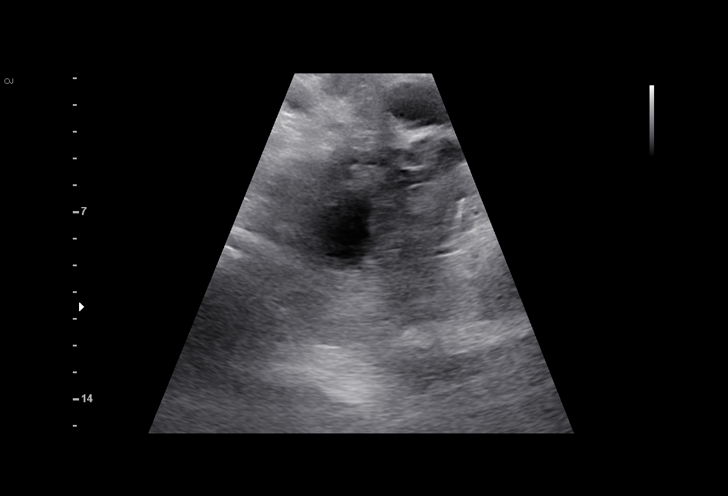
[im 15/20]
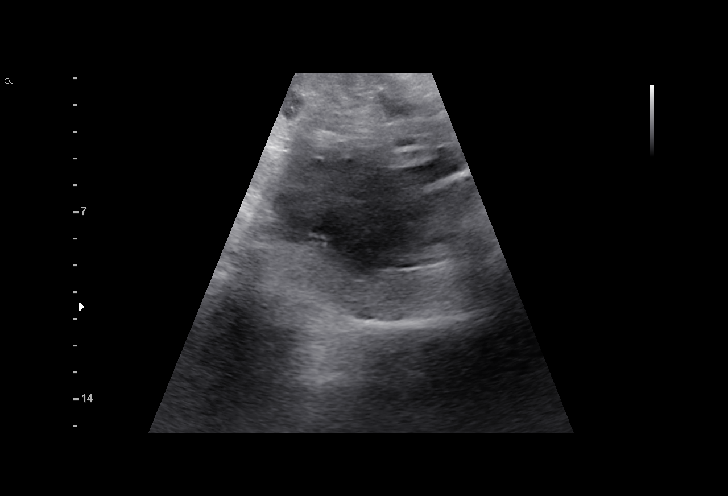
[im 16/20]
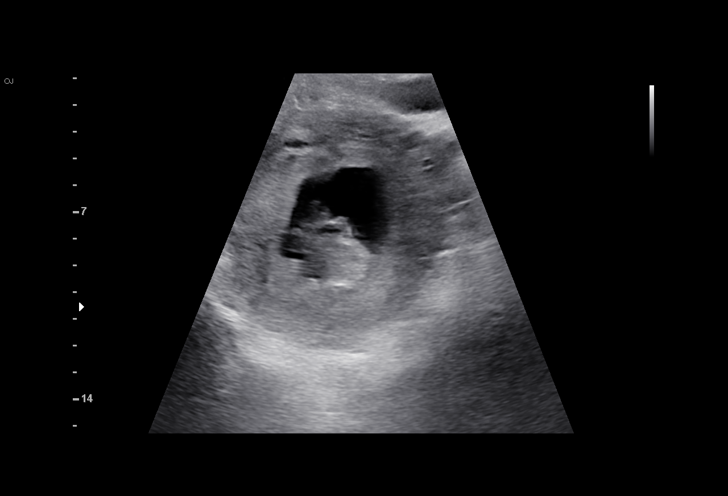
[im 17/20]
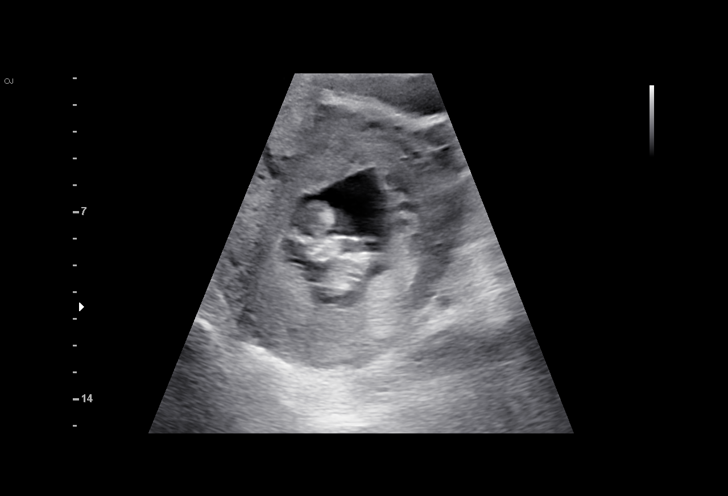
[im 19/20]
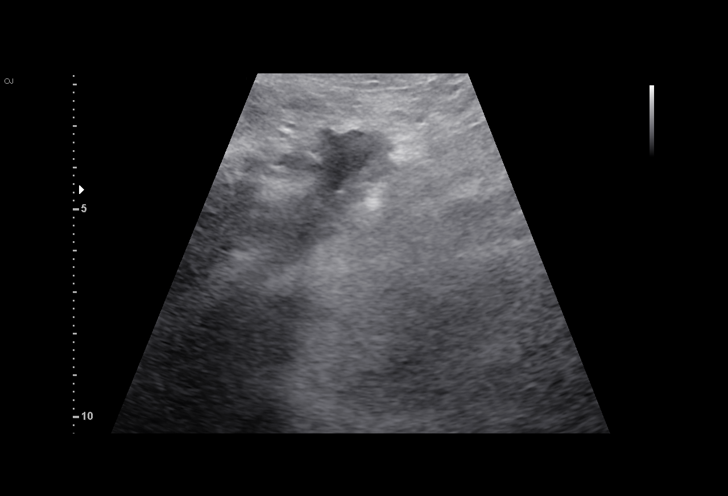
[im 20/20]
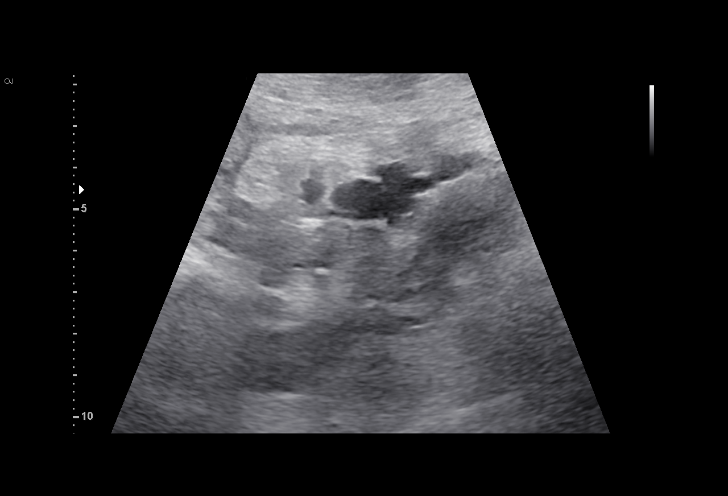

[15 of 20 positions shown; findings below may reference images not displayed]

TRANSLUCENCY

1  FETENEABTAMU JINUIT          [id number]      [id number]     [id number]
Indications

12 weeks gestation of pregnancy
Encounter for nuchal translucency
OB History

Blood Type:            Height:  5'4"   Weight (lb):  171       BMI:
Gravidity:    6         Term:   2        Prem:   0        SAB:   2
TOP:          1       Ectopic:  0        Living: 1
Fetal Evaluation

Num Of Fetuses:     1
Preg. Location:     Intrauterine
Gest. Sac:          Intrauterine
Fetal Pole:         Visualized
Fetal Heart         162
Rate(bpm):
Cardiac Activity:   Observed
Biometry

CRL:      59.3  mm     G. Age:  12w 2d                  EDD:   04/12/17
Gestational Age

LMP:           12w 4d        Date:  07/04/16                 EDD:   04/10/17
Best:          12w 4d     Det. By:  LMP  (07/04/16)          EDD:   04/10/17
Impression

IUP at 12+4 weeks, here for first trimester screening
Normal fetal cardiac activity
normal placentation and amniotic fluid
normal fetal morphology for this EGA
Unable to obtain adequate nuchal translucency images
Recommendations

Attempt repeat in 1 week

## 2018-12-20 ENCOUNTER — Other Ambulatory Visit: Payer: Self-pay

## 2018-12-20 ENCOUNTER — Ambulatory Visit (INDEPENDENT_AMBULATORY_CARE_PROVIDER_SITE_OTHER): Payer: Medicaid Other

## 2018-12-20 VITALS — BP 154/88 | HR 72

## 2018-12-20 DIAGNOSIS — Z5189 Encounter for other specified aftercare: Secondary | ICD-10-CM

## 2018-12-20 NOTE — Progress Notes (Signed)
Subjective:     Holly Gordon is a 38 y.o. female who presents to the clinic one week status post cesarean delivery for wound check.. Eating a regular diet with out  difficulty. Bowel movements are normal. Small amount of pain at this time.  {Review of Systems   Objective:    BP (!) 154/88   Pulse 72   LMP 03/12/2018 (Exact Date)  General:  Well appearance   Abdomen: post op abd exam:soft,bowel sounds active non-tender  Incision:   incision:no dehiscence,incision well approximated,healing well,no drainage no erythema no hernia,no seroma,no swelling     Assessment:    Doing well postoperatively    Plan:    1. Continue any current medications. 2. Wound care discussed. 3. Activity restrictions: At patient on risk 4. Anticipated return to work: N/A. 5. Follow up: Postpartum appointment   Kyisha Fowle Jeanella Anton, CMA

## 2019-01-18 ENCOUNTER — Ambulatory Visit: Payer: Medicaid Other | Admitting: Obstetrics & Gynecology

## 2019-01-24 ENCOUNTER — Telehealth: Payer: Self-pay | Admitting: *Deleted

## 2019-01-24 MED ORDER — SERTRALINE HCL 50 MG PO TABS: 50.0000 mg | ORAL_TABLET | Freq: Every day | ORAL | 4 refills | Status: DC

## 2019-01-24 NOTE — Telephone Encounter (Signed)
Pt called requesting meds for depression, anxiety and panic attacks. Informed that Dr Kennon Rounds will send in medication and that we also recommend speaking with Roselyn Reef our counselor. Pt declining talking to any one at this point. Pt feeling very overwhelmed with her current situation with her baby in the hospital and her kids at home and feels like she needs something to take the edge off. Pt to call if anything changes and does want to talk to someone and to keep postpartum appointment next week for follow up. Informed of medication sent in and pt made aware that it can take a month or so to kick in and to start at 50mg  but take 1/2 x 3-5 days until she is used ot any side effects--can bump up to 100 mg daily after 4 wks, if not helping. Pt verbalizes and understands

## 2019-02-02 ENCOUNTER — Other Ambulatory Visit: Payer: Self-pay

## 2019-02-02 ENCOUNTER — Encounter: Payer: Self-pay | Admitting: Family Medicine

## 2019-02-02 ENCOUNTER — Ambulatory Visit (INDEPENDENT_AMBULATORY_CARE_PROVIDER_SITE_OTHER): Payer: Medicaid Other | Admitting: Family Medicine

## 2019-02-02 VITALS — BP 125/74 | HR 74 | Wt 166.4 lb

## 2019-02-02 DIAGNOSIS — Z1389 Encounter for screening for other disorder: Secondary | ICD-10-CM | POA: Diagnosis not present

## 2019-02-02 DIAGNOSIS — F53 Postpartum depression: Secondary | ICD-10-CM

## 2019-02-02 MED ORDER — HYDROXYZINE HCL 25 MG PO TABS: 25.0000 mg | ORAL_TABLET | Freq: Four times a day (QID) | ORAL | 2 refills | Status: DC | PRN

## 2019-02-02 MED ORDER — SERTRALINE HCL 50 MG PO TABS: 50.0000 mg | ORAL_TABLET | Freq: Every day | ORAL | 4 refills | Status: DC

## 2019-02-02 NOTE — Progress Notes (Signed)
Post Partum Exam  Holly Gordon is a 38 y.o. [phone] female who presents for a postpartum visit. She is 7 weeks postpartum following a c-section delivery on 12/13/2018. I have fully reviewed the prenatal and intrapartum course. The delivery was at 39.3 gestational weeks. Anesthesia: Epidual. Postpartum course has been uncomplicated. 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 Infant being fed by NG tube. Baby is feeding by bottle. Bleeding not at this time. Bowel function is normal . Bladder function is normal. Patient is not sexually active.Contraception method is  Tubal ligation. Postpartum depression screening:positive   Edinburgh Postnatal Depression Scale - 02/02/19 1421      Edinburgh Postnatal Depression Scale:  In the Past 7 Days   I have been able to laugh and see the funny side of things.  1    I have looked forward with enjoyment to things.  2    I have blamed myself unnecessarily when things went wrong.  2    I have been anxious or worried for no good reason.  3    I have felt scared or panicky for no good reason.  3    Things have been getting on top of me.  2    I have been so unhappy that I have had difficulty sleeping.  0    I have felt sad or miserable.  2    I have been so unhappy that I have been crying.  1    The thought of harming myself has occurred to me.  0    Edinburgh Postnatal Depression Scale Total  16      The following portions of the patient's history were reviewed and updated as appropriate: allergies, current medications, past family history, past medical history, past social history, past surgical history and problem list.  Last pap smear done and was abnormal 09/09/2016   Review of Systems Pertinent items are noted in HPI.    Objective:  Blood pressure 125/74, pulse 74, weight 166 lb 6.4 oz (75.5 kg), unknown if  currently breastfeeding.   Gen: well appearing, NAD HEENT: no scleral icterus CV: RR Lung: Normal WOB Ext: warm well perfused      Assessment:   Abnormal postpartum exam due to mood disorder.   Plan:   1. Contraception: BTL   2. Infant feeding- formula only per NG. Mother without breast complaints and reports milk did not come in.   3. Mood/Depression- mother with extensive depression history and known postpartum worsening in prior pregnancies. Currently is the primary care giver for medical complex infant. She reports significant depressive sx. She is bonding with infant but does endorse feeling more like a nurse/care air than a mother at times. She reports she is very nervous and anxious to leave this infant with anyone else including mother and FOB. She is always worried "she might die." - taking zoloft regularly for 1 week-- counseled to increase from 50 mg to 100mg  as the patient remembers being on 100 or 150 mg in the past - offered integrated BH and LCSW care at the [facility] if needed. The patient declined services strongly. She reports having has therapy in the past and is frustrated when she has to repeat her story again and again when people leave or retire.  I counseled that I would highly recommend having a dedicated space to talk about the  care of medical complex child. She reports she does have online support groups for parents and patients with transposition and RV hypoplasia. She finds these groups - Counseled to seek familial support to get 4 hrs straight of sleep as this will help stabilize her mood - Encouraged spending time with her infant non-care related and encouraged use of skin to skin to promote bonding when possible.  - Mother feels safe currently and has no SI/HI thoughts. Counseled to seek immediate care if these were to change.  - Patient agreed to mood follow up at our office to see how medication therapy is working - Engineer, manufacturing systems in [employer] for vistril this visit to assist  with anxiety and possibly sleep  - At next visit: If on Zoloft 100mg  with little change in PHQ9-- increase to 150 or 200mg  and add aripiprazole or other mood stabilizer. Consider Trazodone for sleep. Referral to pysch as well would be appropriate if non-improved depressive sx.   4. Follow up in: 4 weeks

## 2019-02-06 DIAGNOSIS — O99345 Other mental disorders complicating the puerperium: Secondary | ICD-10-CM | POA: Insufficient documentation

## 2019-02-06 DIAGNOSIS — F53 Postpartum depression: Secondary | ICD-10-CM | POA: Insufficient documentation

## 2019-02-14 ENCOUNTER — Telehealth: Payer: Self-pay | Admitting: *Deleted

## 2019-02-14 MED ORDER — CEPHALEXIN 500 MG PO CAPS: 500.0000 mg | ORAL_CAPSULE | Freq: Three times a day (TID) | ORAL | 0 refills | Status: DC

## 2019-02-14 NOTE — Telephone Encounter (Signed)
Pt called concerned that her incision is infected,states its red and has an odorous discharge. Pt will upload pictures to mychart for a provide to review.

## 2019-02-14 NOTE — Telephone Encounter (Signed)
Called pt regarding her incision after provider reviewing her picture. Will send in antibiotics to pharmacy and instructed pt to keep area dry and to also use lotrimin cream on the area. Informed pt to call if it gets worse, if not we will see her at her next appt on 12/30. Pt verbalizes and understands.

## 2019-03-02 ENCOUNTER — Ambulatory Visit: Payer: Medicaid Other | Admitting: Family Medicine

## 2019-03-28 ENCOUNTER — Other Ambulatory Visit: Payer: Self-pay | Admitting: *Deleted

## 2019-03-28 MED ORDER — FLUCONAZOLE 150 MG PO TABS: 150.0000 mg | ORAL_TABLET | Freq: Once | ORAL | 3 refills | Status: AC

## 2019-06-29 ENCOUNTER — Other Ambulatory Visit: Payer: Self-pay

## 2019-06-29 ENCOUNTER — Telehealth (INDEPENDENT_AMBULATORY_CARE_PROVIDER_SITE_OTHER): Payer: Medicaid Other | Admitting: Advanced Practice Midwife

## 2019-06-29 ENCOUNTER — Telehealth: Payer: Medicaid Other | Admitting: Advanced Practice Midwife

## 2019-06-29 ENCOUNTER — Other Ambulatory Visit: Payer: Self-pay | Admitting: Family Medicine

## 2019-06-29 DIAGNOSIS — F53 Postpartum depression: Secondary | ICD-10-CM

## 2019-06-29 DIAGNOSIS — F410 Panic disorder [episodic paroxysmal anxiety] without agoraphobia: Secondary | ICD-10-CM

## 2019-06-29 DIAGNOSIS — Z8659 Personal history of other mental and behavioral disorders: Secondary | ICD-10-CM

## 2019-06-29 MED ORDER — HYDROXYZINE HCL 25 MG PO TABS: 25.0000 mg | ORAL_TABLET | Freq: Four times a day (QID) | ORAL | 2 refills | Status: DC | PRN

## 2019-06-29 MED ORDER — SERTRALINE HCL 100 MG PO TABS: 100.0000 mg | ORAL_TABLET | Freq: Every day | ORAL | 11 refills | Status: AC

## 2019-06-29 NOTE — Progress Notes (Signed)
   TELEHEALTH VIRTUAL VISIT ENCOUNTER NOTE  I connected with Holly Gordon on 06/29/19 at  9:30 AM EDT by telephone at home and verified that I am speaking with the correct person using two identifiers.   I discussed the limitations, risks, security and privacy concerns of performing an evaluation and management service by telephone and the availability of in person appointments. I also discussed with the patient that there may be a patient responsible charge related to this service. The patient expressed understanding and agreed to proceed.   History:  Holly Gordon is a 39 y.o. [phone] female requesting assistance with mood stabilization. Patient's young daughter is on life support at [place]. She endorses ongoing management of panic attacks, depression and anxiety. She has previously attempted management with Zoloft but states it causes her to vomit and lose her appetite. She also has an active prescription for Vistaril but the side effect of sleepiness is intolerable for her as she wants to be as present as possible at her daughter's bedside. She denies GYN concerns     Past Medical History:  Diagnosis Date  . Anemia   . Anxiety   . Bacterial vaginitis 08/26/2016  . Depression    had counseling in the passt no meds  . History of abnormal cervical Pap smear    Past Surgical History:  Procedure Laterality Date  . CESAREAN SECTION  2008   x1  . CESAREAN SECTION N/A 04/03/2017   Procedure: CESAREAN SECTION;  Surgeon: Hermina Staggers, MD;  Location: [facility] BIRTHING SU[facility];  Service: Obstetrics;  Laterality: N/A;  . COLPOSCOPY     abnormal pap  . INDUCED ABORTION    . TONSILLECTOMY    . WISDOM TOOTH EXTRACTION     x4   The following portions of the patient's history were reviewed and updated as appropriate: allergies, current medications, past family history, past medical history, past social history, past surgical history and problem list.   Review of Systems:  Pertinent items  noted in HPI and remainder of comprehensive ROS otherwise negative.  Physical Exam:   General:  Alert, oriented and cooperative.   Mental Status: Normal mood and affect perceived. Normal judgment and thought content.  Physical exam deferred due to nature of the encounter  Labs and Imaging No results found for this or any previous visit (from the past 336 hour(s)). No results found.    Assessment and Plan:     1. Panic attacks --Acute exacerbation with daughter on life support --Patient understandably unavailable for office visit or virtual appointment  2. History of depression  Consulted with Dr. Alvester Morin, who sent in prescriptions for increased dosage of Zoloft and encouraged continued use of Vistaril. Patient counseled that anti-anxiety medications contain side effect of mild sleepiness but serve greater purpose of allowing her to be present at bedside. Patient expressing frustration with situation and advice given, verbalizes that she prefers to not take anything.  Phone call abruptly discontinued by patient.    I provided ten minutes of non-face-to-face time during this encounter.   Calvert Cantor, CNM [facility] [employer], [facility]

## 2020-03-07 ENCOUNTER — Telehealth: Payer: Self-pay | Admitting: Clinical

## 2020-03-07 DIAGNOSIS — F410 Panic disorder [episodic paroxysmal anxiety] without agoraphobia: Secondary | ICD-10-CM

## 2020-03-07 DIAGNOSIS — Z8659 Personal history of other mental and behavioral disorders: Secondary | ICD-10-CM

## 2020-03-07 NOTE — Telephone Encounter (Signed)
Pt is requesting information about accessing psychiatry for [facility] medication management as soon as possible.   Patient and/or legal guardian verbally consented to [facility] services about presenting concerns and psychiatric consultation as appropriate.  Pt was previously on Lamictal (started at low dose, gradually up to 200mg ) and Buspar (twice/day, does not recall dosage). Pt also rotated between Valium and Klonopin ("only as needed, and one bottle lasted many months). Pt stopped taking this medication with positive pregnancy of current 38yo, then 64mo.   Pt is currently prescribed Vistaril ("makes me too sleepy, and I have to be awake to take care of my babies") and Zoloft ("Making me sick and throwing up").   Pt is having panic attacks daily. 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   Pt is given the number to the Access to Care Line for [facility], and agrees to call today to get an appointment asap to see a psychiatrist for [facility] medication management. She is aware she may not get in to see psychiatry quickly, and is requesting to start back on Lamictal and Buspar as soon as possible (and stop taking Zoloft). Pt prefers to have Klonopin or Valium also, for as needed with bad panic attacks, to at least get her past her baby's upcoming heart surgery.   Pt agrees for [facility] to talk to consulting psychiatrist and ob/gyn about her concerns, and to follow up virtual visit in two weeks. Pt may call [facility] Jayson Waterhouse prior to scheduled visit as needed, at [phone].

## 2020-03-09 NOTE — BH Specialist Note (Signed)
Integrated Behavioral Health via Telemedicine Visit  03/09/2020 Holly Gordon [id number]  Number of Integrated Behavioral Health visits: 1 Session Start time: 10:50  Session End time: 11:36 Total time: 49  Referring Provider: Clayton Bibles, CNM Patient/Family location: Home [facility] Provider location: [facility]  All persons participating in visit: Patient Holly Gordon and [facility] Jamie McMannes   Types of Service: Individual psychotherapy  I connected with Elenore Paddy Gazda and/or Elenore Paddy Fielding's n/a by Video  (Video is Caregility application) and verified that I am speaking with the correct person using two identifiers.Discussed confidentiality: Yes   I discussed the limitations of telemedicine and the availability of in person appointments.  Discussed there is a possibility of technology failure and discussed alternative modes of communication if that failure occurs.  I discussed that engaging in this telemedicine visit, they consent to the provision of behavioral healthcare and the services will be billed under their insurance.  Patient and/or legal guardian expressed understanding and consented to Telemedicine visit: Yes   Presenting Concerns: Patient and/or family reports the following symptoms/concerns: Pt states her primary concern today is anxiety with daily panic as she gets closer to baby's upcoming heart MRI and surgery; has scheduled appointment with [facility] med prescriber on 03/26/20; helps to talk through feelings today.  Duration of problem: Ongoing; Severity of problem: severe  Patient and/or Family's Strengths/Protective Factors: Concrete supports in place (healthy food, safe environments, etc.), Sense of purpose and Parental Resilience  Goals Addressed: Patient will: 1.  Reduce symptoms of: anxiety, depression and stress  2.  Demonstrate ability to: Increase healthy adjustment to current life  circumstances  Progress towards Goals: Ongoing  Interventions: Interventions utilized:  Motivational Interviewing Standardized Assessments completed: GAD-7 and PHQ 9   Patient and/or Family Response: Pt agrees to treatment plan  Assessment: Patient currently experiencing Anxiety disorder, unspecified.   Patient may benefit from psychoeducation and brief therapeutic interventions regarding coping with symptoms of anxiety with panic and depression .  Plan: 1. Follow up with behavioral health clinician on : Two weeks 2. Behavioral recommendations:  -Continue with plan to attend BH/psychiatry appointment on 03/27/19; take [facility] medication as prescribed at that time -Consider establishing care with PCP of choice (contact insurance for options in your county) -Consider Reading educational materials regarding coping with symptoms of anxiety with panic attacks (on After Visit Summary)  3. Referral(s): Integrated [employer] (In Clinic)  I discussed the assessment and treatment plan with the patient and/or parent/guardian. They were provided an opportunity to ask questions and all were answered. They agreed with the plan and demonstrated an understanding of the instructions.   They were advised to call back or seek an in-person evaluation if the symptoms worsen or if the condition fails to improve as anticipated.  Rae Lips, LCSW   Depression screen [facility] 2/9 03/21/2020  Decreased Interest 1  Down, Depressed, Hopeless 1  PHQ - 2 Score 2  Altered sleeping 1  Tired, decreased energy 3  Change in appetite 3  Feeling bad or failure about yourself  3  Trouble concentrating 3  Moving slowly or fidgety/restless 0  Suicidal thoughts 0  PHQ-9 Score 15   GAD 7 : Generalized Anxiety Score 03/21/2020  Nervous, Anxious, on Edge 3  Control/stop worrying 3  Worry too much - different things 3  Trouble relaxing 3  Restless 0  Easily annoyed or irritable 3  Afraid - awful  might happen 3  Total GAD 7 Score 18

## 2020-03-21 ENCOUNTER — Ambulatory Visit (INDEPENDENT_AMBULATORY_CARE_PROVIDER_SITE_OTHER): Payer: Medicaid Other | Admitting: Clinical

## 2020-03-21 ENCOUNTER — Other Ambulatory Visit: Payer: Self-pay

## 2020-03-21 DIAGNOSIS — F419 Anxiety disorder, unspecified: Secondary | ICD-10-CM

## 2020-03-21 NOTE — Patient Instructions (Addendum)
[facility] [address] [place], [place] [address] [phone] (main office) [phone] (Shaylin Blatt's office)  Coping with Panic Attacks   What is a panic attack?  You may have had a panic attack if you experienced four or more of the symptoms listed below coming on abruptly and peaking in about 10 minutes.  Panic Symptoms   . Pounding heart  . Sweating  . Trembling or shaking  . Shortness of breath  . Feeling of choking  . Chest pain  . Nausea or abdominal distress    . Feeling dizzy, unsteady, lightheaded, or faint  . Feelings of unreality or being detached from yourself  . Fear of losing control or going crazy  . Fear of dying  . Numbness or tingling  . Chills or hot flashes      Panic attacks are sometimes accompanied by avoidance of certain places or situations. These are often situations that would be difficult to escape from or in which help might not be available. Examples might include crowded shopping malls, public transportation, restaurants, or driving.   Why do panic attacks occur?   Panic attacks are the body's alarm system gone awry. All of us have a built-in alarm system, powered by adrenaline, which increases our heart rate, breathing, and blood flow in response to danger. Ordinarily, this 'danger response system' works well. In some people, however, the response is either out of proportion to whatever stress is going on, or may come out of the blue without any stress at all.   For example, if you are walking in the woods and see a bear coming your way, a variety of changes occur in your body to prepare you to either fight the danger or flee from the situation. Your heart rate will increase to get more blood flow around your body, your breathing rate will quicken so that more oxygen is available, and your muscles will tighten in order to be ready to fight or run. You may feel nauseated as blood flow leaves your  stomach area and moves into your limbs. These bodily changes are all essential to helping you survive the dangerous situation. After the danger has passed, your body functions will begin to go back to normal. This is because your body also has a system for "recovering" by bringing your body back down to a normal state when the danger is over.   As you can see, the emergency response system is adaptive when there is, in fact, a "true" or "real" danger (e.g., bear). However, sometimes people find that their emergency response system is triggered in "everyday" situations where there really is no true physical danger (e.g., in a meeting, in the grocery store, while driving in normal traffic, etc.).   What triggers a panic attack?  Sometimes particularly stressful situations can trigger a panic attack. For example, an argument with your spouse or stressors at work can cause a stress response (activating the emergency response system) because you perceive it as threatening or overwhelming, even if there is no direct risk to your survival.  Sometimes panic attacks don't seem to be triggered by anything in particular- they may "come out of the blue". Somehow, the natural "fight or flight" emergency response system has gotten activated when there is no real danger. Why does the body go into "emergency mode" when there is no real danger?   Often, people with panic attacks are frightened or alarmed by the physical sensations of   the emergency response system. First, unexpected physical sensations are experienced (tightness in your chest or some shortness of breath). This then leads to feeling fearful or alarmed by these symptoms ("Something's wrong!", "Am I having a heart attack?", "Am I going to faint?") The mind perceives that there is a danger even though no real danger exists. This, in turn, activates the emergency response system ("fight or flight"), leading to a "full blown" panic attack. In summary, panic  attacks occur when we misinterpret physical symptoms as signs of impending death, craziness, loss of control, embarrassment, or fear of fear. Sometimes you may be aware of thoughts of danger that activate the emergency response system (for example, thinking "I'm having a heart attack" when you feel chest pressure or increased heart rate). At other times, however, you may not be aware of such thoughts. After several incidences of being afraid of physical sensations, anxiety and panic can occur in response to the initial sensations without conscious thoughts of danger. Instead, you just feel afraid or alarmed. In other words, the panic or fear may seem to occur "automatically" without you consciously telling yourself anything.   After having had one or more panic attacks, you may also become more focused on what is going on inside your body. You may scan your body and be more vigilant about noticing any symptoms that might signal the start of a panic attack. This makes it easier for panic attacks to happen again because you pick up on sensations you might otherwise not have noticed, and misinterpret them as something dangerous. A panic attack may then result.      How do I cope with panic attacks?  An important part of overcoming panic attacks involves re-interpreting your body's physical reactions and teaching yourself ways to decrease the physical arousal. This can be done through practicing the cognitive and behavioral interventions below.   Research has found that over half of people who have panic attacks show some signs of hyperventilation or overbreathing. This can produce initial sensations that alarm you and lead to a panic attack. Overbreathing can also develop as part of the panic attack and make the symptoms worse. When people hyperventilate, certain blood vessels in the body become narrower. In particular, the brain may get slightly less oxygen. This can lead to the symptoms of dizziness,  confusion, and lightheadedness that often occur during panic attacks. Other parts of the body may also get a bit less oxygen, which may lead to numbness or tingling in the hands or feet or the sensation of cold, clammy hands. It also may lead the heart to pump harder. Although these symptoms may be frightening and feel unpleasant, it is important to remember that hyperventilating is not dangerous. However, you can help overcome the unpleasantness of overbreathing by practicing Breathing Retraining.   Practice this basic technique three times a day, every day:  . Inhale. With your shoulders relaxed, inhale as slowly and deeply as you can while you count to six. If you can, use your diaphragm to fill your lungs with air.  . Hold. Keep the air in your lungs as you slowly count to four.  . Exhale. Slowly breath out as you count to six.  . Repeat. Do the inhale-hold-exhale cycle several times. Each time you do it, exhale for longer counts.  Like any new skill, Breathing Retraining requires practice. Try practicing this skill twice a day for several minutes. Initially, do not try this technique in specific situations or when you   become frightened or have a panic attack. Begin by practicing in a quiet environment to build up your skill level so that you can later use it in time of "emergency."   2. Decreasing Avoidance  Regardless of whether you can identify why you began having panic attacks or whether they seemed to come out of the blue, the places where you began having panic attacks often can become triggers themselves. It is not uncommon for individuals to begin to avoid the places where they have had panic attacks. Over time, the individual may begin to avoid more and more places, thereby decreasing their activities and often negatively impacting their quality of life. To break the cycle of avoidance, it is important to first identify the places or situations that are being avoided, and then to do some  "relearning."  To begin this intervention, first create a list of locations or situations that you tend to avoid. Then choose an avoided location or situation that you would like to target first. Now develop an "exposure hierarchy" for this situation or location. An "exposure hierarchy" is a list of actions that make you feel anxious in this situation. Order these actions from least to most anxiety-producing. It is often helpful to have the first item on your hierarchy involve thinking or imagining part of the feared/avoided situation.   Here is an example of an exposure hierarchy for decreasing avoidance of the grocery store. Note how it is ordered from the least amount of anxiety (at the top) to the most anxiety (at the bottom):  . Think about going to the grocery store alone.  . Go to the grocery store with a friend or family member.  . Go to the grocery store alone to pick up a few small items (5-10 minutes in the store).  . Shopping for 10-20 minutes in the store alone.  . Doing the shopping for the week by myself (20-30 minutes in the store).   Your homework is to "expose" yourself to the lowest item on your hierarchy and use your breathing relaxation and coping statements (see below) to help you remain in the situation. Practice this several times during the upcoming week. Once you have mastered each item with minimal anxiety, move on to the next higher action on your list.   Cognitive Interventions  1. Identify your negative self-talk Anxious thoughts can increase anxiety symptoms and panic. The first step in changing anxious thinking is to identify your own negative, alarming self-talk. Some common alarming thoughts:  . I'm having a heart attack.            . I must be going crazy. . I think I'm dying. . People will think I'm crazy. . I'm going to pass our.  . Oh no- here it comes.  . I can't stand this.  . I've got to get out of here!  2. Use positive coping statements Changing or  disrupting a pattern of anxious thoughts by replacing them with more calming or supportive statements can help to divert a panic attack. Some common helpful coping statements:  . This is not an emergency.  . I don't like feeling this way, but I can accept it.  . I can feel like this and still be okay.  . This has happened before, and I was okay. I'll be okay this time, too.  . I can be anxious and still deal with this situation.        

## 2020-03-26 DIAGNOSIS — F3489 Other specified persistent mood disorders: Secondary | ICD-10-CM | POA: Diagnosis not present

## 2020-03-26 DIAGNOSIS — F418 Other specified anxiety disorders: Secondary | ICD-10-CM | POA: Diagnosis not present

## 2020-03-30 NOTE — BH Specialist Note (Deleted)
Integrated Behavioral Health via Telemedicine Visit  03/30/2020 BEXLEY LAUBACH [id number]  Number of Integrated Behavioral Health visits: *** Session Start time: 2:45***  Session End time: 3:15*** Total time: {IBH Total OJJK:09381829}  Referring Provider: *** Patient/Family location: Home*** [facility] Provider location: [facility]  All persons participating in visit: Patient *** and [facility] Raisha Brabender ***  Types of Service: {CHL AMB TYPE OF SERVICE:[phone]}  I connected with Elenore Paddy Hammett and/or Elenore Paddy Looney's {family members:20773} by Telephone  (Video is Caregility application) and verified that I am speaking with the correct person using two identifiers.Discussed confidentiality: {YES/NO:21197}  I discussed the limitations of telemedicine and the availability of in person appointments.  Discussed there is a possibility of technology failure and discussed alternative modes of communication if that failure occurs.  I discussed that engaging in this telemedicine visit, they consent to the provision of behavioral healthcare and the services will be billed under their insurance.  Patient and/or legal guardian expressed understanding and consented to Telemedicine visit: {YES/NO:21197}  Presenting Concerns: Patient and/or family reports the following symptoms/concerns: *** Duration of problem: ***; Severity of problem: {Mild/Moderate/Severe:20260}  Patient and/or Family's Strengths/Protective Factors: {CHL AMB BH PROTECTIVE FACTORS:[phone]}  Goals Addressed: Patient will: 1.  Reduce symptoms of: {IBH Symptoms:21014056}  2.  Increase knowledge and/or ability of: {IBH Patient Tools:21014057}  3.  Demonstrate ability to: {IBH Goals:21014053}  Progress towards Goals: {CHL AMB BH PROGRESS TOWARDS GOALS:8590421841}  Interventions: Interventions utilized:  {IBH Interventions:21014054} Standardized Assessments  completed: {IBH Screening Tools:21014051}  Patient and/or Family Response: ***  Assessment: Patient currently experiencing ***.   Patient may benefit from ***.  Plan: 1. Follow up with behavioral health clinician on : *** 2. Behavioral recommendations: *** 3. Referral(s): {IBH Referrals:21014055}  I discussed the assessment and treatment plan with the patient and/or parent/guardian. They were provided an opportunity to ask questions and all were answered. They agreed with the plan and demonstrated an understanding of the instructions.   They were advised to call back or seek an in-person evaluation if the symptoms worsen or if the condition fails to improve as anticipated.  Valetta Close Royce Stegman, LCSW

## 2020-06-22 ENCOUNTER — Other Ambulatory Visit: Payer: Self-pay

## 2020-06-22 ENCOUNTER — Emergency Department (HOSPITAL_COMMUNITY)
Admission: EM | Admit: 2020-06-22 | Discharge: 2020-06-22 | Disposition: A | Discharge status: Home / Self Care | Payer: Medicaid Other | Attending: Emergency Medicine | Admitting: Emergency Medicine

## 2020-06-22 ENCOUNTER — Emergency Department (HOSPITAL_COMMUNITY): Payer: Medicaid Other

## 2020-06-22 ENCOUNTER — Encounter (HOSPITAL_COMMUNITY): Payer: Self-pay

## 2020-06-22 DIAGNOSIS — F17210 Nicotine dependence, cigarettes, uncomplicated: Secondary | ICD-10-CM | POA: Diagnosis not present

## 2020-06-22 DIAGNOSIS — R112 Nausea with vomiting, unspecified: Secondary | ICD-10-CM | POA: Diagnosis not present

## 2020-06-22 DIAGNOSIS — R1084 Generalized abdominal pain: Secondary | ICD-10-CM | POA: Diagnosis not present

## 2020-06-22 DIAGNOSIS — R109 Unspecified abdominal pain: Secondary | ICD-10-CM | POA: Diagnosis not present

## 2020-06-22 DIAGNOSIS — R197 Diarrhea, unspecified: Secondary | ICD-10-CM | POA: Diagnosis not present

## 2020-06-22 DIAGNOSIS — E039 Hypothyroidism, unspecified: Secondary | ICD-10-CM | POA: Insufficient documentation

## 2020-06-22 DIAGNOSIS — R1110 Vomiting, unspecified: Secondary | ICD-10-CM | POA: Diagnosis not present

## 2020-06-22 LAB — URINALYSIS, ROUTINE W REFLEX MICROSCOPIC
Bacteria, UA: NONE SEEN
Bilirubin Urine: NEGATIVE
Glucose, UA: NEGATIVE mg/dL
Hgb urine dipstick: NEGATIVE
Ketones, ur: NEGATIVE mg/dL
Leukocytes,Ua: NEGATIVE
Nitrite: NEGATIVE
Protein, ur: 30 mg/dL — AB
Specific Gravity, Urine: 1.029 (ref 1.005–1.030)
pH: 7 (ref 5.0–8.0)

## 2020-06-22 LAB — CBC
HCT: 45.9 % (ref 36.0–46.0)
Hemoglobin: 15.5 g/dL — ABNORMAL HIGH (ref 12.0–15.0)
MCH: 32.4 pg (ref 26.0–34.0)
MCHC: 33.8 g/dL (ref 30.0–36.0)
MCV: 96 fL (ref 80.0–100.0)
Platelets: 221 10*3/uL (ref 150–400)
RBC: 4.78 MIL/uL (ref 3.87–5.11)
RDW: 12.6 % (ref 11.5–15.5)
WBC: 9.4 10*3/uL (ref 4.0–10.5)
nRBC: 0 % (ref 0.0–0.2)

## 2020-06-22 LAB — COMPREHENSIVE METABOLIC PANEL
ALT: 17 U/L (ref 0–44)
AST: 21 U/L (ref 15–41)
Albumin: 4.3 g/dL (ref 3.5–5.0)
Alkaline Phosphatase: 62 U/L (ref 38–126)
Anion gap: 9 (ref 5–15)
BUN: 8 mg/dL (ref 6–20)
CO2: 26 mmol/L (ref 22–32)
Calcium: 9.5 mg/dL (ref 8.9–10.3)
Chloride: 102 mmol/L (ref 98–111)
Creatinine, Ser: 0.96 mg/dL (ref 0.44–1.00)
GFR, Estimated: >60 mL/min (ref >60)
Glucose, Bld: 164 mg/dL — ABNORMAL HIGH (ref 70–99)
Potassium: 4 mmol/L (ref 3.5–5.1)
Sodium: 137 mmol/L (ref 135–145)
Total Bilirubin: 1 mg/dL (ref 0.3–1.2)
Total Protein: 6.9 g/dL (ref 6.5–8.1)

## 2020-06-22 LAB — LIPASE, BLOOD: Lipase: 33 U/L (ref 11–51)

## 2020-06-22 LAB — I-STAT BETA HCG BLOOD, ED (MC, WL, AP ONLY): I-stat hCG, quantitative: <5 m[IU]/mL (ref <5)

## 2020-06-22 MED ORDER — DICYCLOMINE HCL 20 MG PO TABS: 20.0000 mg | ORAL_TABLET | Freq: Two times a day (BID) | ORAL | 0 refills | Status: DC

## 2020-06-22 MED ORDER — IOHEXOL 300 MG/ML  SOLN
100.0000 mL | Freq: Once | INTRAMUSCULAR | Status: AC
  Administered 2020-06-22: 100 mL via INTRAVENOUS

## 2020-06-22 MED ORDER — SODIUM CHLORIDE 0.9 % IV BOLUS
1000.0000 mL | Freq: Once | INTRAVENOUS | Status: AC
  Administered 2020-06-22: 1000 mL via INTRAVENOUS

## 2020-06-22 MED ORDER — FENTANYL CITRATE (PF) 100 MCG/2ML IJ SOLN
50.0000 ug | Freq: Once | INTRAMUSCULAR | Status: AC
  Administered 2020-06-22: 50 ug via INTRAVENOUS
  Filled 2020-06-22: qty 2

## 2020-06-22 MED ORDER — METOCLOPRAMIDE HCL 5 MG/ML IJ SOLN
10.0000 mg | Freq: Once | INTRAMUSCULAR | Status: AC
  Administered 2020-06-22: 10 mg via INTRAVENOUS
  Filled 2020-06-22: qty 2

## 2020-06-22 MED ORDER — ONDANSETRON 4 MG PO TBDP: 4.0000 mg | ORAL_TABLET | Freq: Three times a day (TID) | ORAL | 0 refills | Status: DC | PRN

## 2020-06-22 MED ORDER — ONDANSETRON HCL 4 MG/2ML IJ SOLN
4.0000 mg | Freq: Once | INTRAMUSCULAR | Status: AC
  Administered 2020-06-22: 4 mg via INTRAVENOUS
  Filled 2020-06-22: qty 2

## 2020-06-22 MED ORDER — ONDANSETRON 4 MG PO TBDP
4.0000 mg | ORAL_TABLET | Freq: Once | ORAL | Status: AC | PRN
  Administered 2020-06-22: 4 mg via ORAL
  Filled 2020-06-22: qty 1

## 2020-06-22 NOTE — ED Notes (Signed)
Pt provided lemon soda for PO challenge

## 2020-06-22 NOTE — ED Triage Notes (Signed)
Pt c/o lower abdominal pain, nausea, vomiting and diarrhea. Pt crying and moaning during triage.

## 2020-06-22 NOTE — Discharge Instructions (Addendum)
Your workup was reassuring in the ED without any abnormalities on the CT scan. Your symptoms are likely related to a viral gastroenteritis. Please pick up medications and take as prescribed. Drink plenty of fluids to stay hydrated. Eat bland foods over the next few days to help settle your stomach.   Follow up with your PCP regarding your ED visit today. If you do not have a PCP you can follow up with [facility] for primary care needs.   Return to the ED for any worsening symptoms

## 2020-06-22 NOTE — ED Provider Notes (Signed)
Care assumed from Roxy Horseman, PA-C, at shift change, please see their notes for full documentation of patient's complaint/HPI. Briefly, pt here with complaints of sudden onset nausea, vomiting, and diarrhea that began approximately 5 hours after eating a cheeseburger yesterday afternoon. Results so far show CBC without leukocytosis, CMP with glucose 164; no other electrolyte abnormalities; bicarb WNL and no gap; lipase 33 and U/A without signs of infection. Plan is to additional liter of fluids and reevaluate.   Physical Exam  BP (!) 144/69   Pulse 78   Temp 98.2 F (36.8 C) (Oral)   Resp 20   SpO2 96%   Physical Exam Vitals and nursing note reviewed.  Constitutional:      Appearance: She is not ill-appearing.  HENT:     Head: Normocephalic and atraumatic.  Eyes:     Conjunctiva/sclera: Conjunctivae normal.  Cardiovascular:     Rate and Rhythm: Normal rate and regular rhythm.     Heart sounds: Normal heart sounds.  Pulmonary:     Effort: Pulmonary effort is normal.     Breath sounds: Normal breath sounds.  Abdominal:     General: Bowel sounds are normal.     Palpations: Abdomen is soft.     Tenderness: There is no abdominal tenderness.  Skin:    General: Skin is warm and dry.     Coloration: Skin is not jaundiced.  Neurological:     Mental Status: She is alert.     ED Course/Procedures     Procedures  Results for orders placed or performed during the hospital encounter of 06/22/20  Lipase, blood  Result Value Ref Range   Lipase 33 11 - 51 U/L  Comprehensive metabolic panel  Result Value Ref Range   Sodium 137 135 - 145 mmol/L   Potassium 4.0 3.5 - 5.1 mmol/L   Chloride 102 98 - 111 mmol/L   CO2 26 22 - 32 mmol/L   Glucose, Bld 164 (H) 70 - 99 mg/dL   BUN 8 6 - 20 mg/dL   Creatinine, Ser 1.69 0.44 - 1.00 mg/dL   Calcium 9.5 8.9 - 67.8 mg/dL   Total Protein 6.9 6.5 - 8.1 g/dL   Albumin 4.3 3.5 - 5.0 g/dL   AST 21 15 - 41 U/L   ALT 17 0 - 44 U/L   Alkaline  Phosphatase 62 38 - 126 U/L   Total Bilirubin 1.0 0.3 - 1.2 mg/dL   GFR, Estimated >93 >81 mL/min   Anion gap 9 5 - 15  CBC  Result Value Ref Range   WBC 9.4 4.0 - 10.5 K/uL   RBC 4.78 3.87 - 5.11 MIL/uL   Hemoglobin 15.5 (H) 12.0 - 15.0 g/dL   HCT 01.7 51.0 - 25.8 %   MCV 96.0 80.0 - 100.0 fL   MCH 32.4 26.0 - 34.0 pg   MCHC 33.8 30.0 - 36.0 g/dL   RDW 52.7 78.2 - 42.3 %   Platelets 221 150 - 400 K/uL   nRBC 0.0 0.0 - 0.2 %  Urinalysis, Routine w reflex microscopic Urine, Clean Catch  Result Value Ref Range   Color, Urine AMBER (A) YELLOW   APPearance HAZY (A) CLEAR   Specific Gravity, Urine 1.029 1.005 - 1.030   pH 7.0 5.0 - 8.0   Glucose, UA NEGATIVE NEGATIVE mg/dL   Hgb urine dipstick NEGATIVE NEGATIVE   Bilirubin Urine NEGATIVE NEGATIVE   Ketones, ur NEGATIVE NEGATIVE mg/dL   Protein, ur 30 (A) NEGATIVE mg/dL  Nitrite NEGATIVE NEGATIVE   Leukocytes,Ua NEGATIVE NEGATIVE   RBC / HPF 0-5 0 - 5 RBC/hpf   WBC, UA 0-5 0 - 5 WBC/hpf   Bacteria, UA NONE SEEN NONE SEEN   Squamous Epithelial / LPF 0-5 0 - 5   Mucus PRESENT   I-Stat beta hCG blood, ED  Result Value Ref Range   I-stat hCG, quantitative <5.0 <5 mIU/mL   Comment 3           CT Abdomen Pelvis W Contrast  Result Date: 06/22/2020 CLINICAL DATA:  Abdominal pain since yesterday. Nausea, vomiting and diarrhea. EXAM: CT ABDOMEN AND PELVIS WITH CONTRAST TECHNIQUE: Multidetector CT imaging of the abdomen and pelvis was performed using the standard protocol following bolus administration of intravenous contrast. CONTRAST:  <See Chart> OMNIPAQUE IOHEXOL 300 MG/ML  SOLN COMPARISON:  None. FINDINGS: Lower chest: The lung bases are clear of acute process. No pleural effusion or pulmonary lesions. The heart is normal in size. No pericardial effusion. The distal esophagus and aorta are unremarkable. Hepatobiliary: No focal hepatic lesions or intrahepatic biliary dilatation. The gallbladder is normal. No common bile duct dilatation.  Pancreas: No mass, inflammation or ductal dilatation. Spleen: Normal size.  No focal lesions. Adrenals/Urinary Tract: The adrenal glands and kidneys are normal. The bladder is normal. Stomach/Bowel: The stomach, duodenum, small bowel and colon are grossly normal without oral contrast. No inflammatory changes, mass lesions or obstructive findings. The appendix is normal. Vascular/Lymphatic: The aorta is normal in caliber. No dissection. The branch vessels are patent. The major venous structures are patent. No mesenteric or retroperitoneal mass or adenopathy. Small scattered lymph nodes are noted. Reproductive: The uterus is retroverted. The ovaries are unremarkable. Other: No pelvic mass or adenopathy. No free pelvic fluid collections. No inguinal mass or adenopathy. No abdominal wall hernia or subcutaneous lesions. Musculoskeletal: Moderate degenerative disc disease noted at L5-S1. No acute bony findings. IMPRESSION: No acute abdominal/pelvic findings, mass lesions or adenopathy. Electronically Signed   By: Rudie Meyer M.D.   On: 06/22/2020 09:48    MDM  On reevaluation patient attempted to drink some ginger ale.  Reports worsening nausea afterwards.  On further exam she continues to have a soft abdomen without specific abdominal tenderness to palpation.  I agree the patient does not require imaging at this time.  Will provide additional medication for nausea and reevaluate.  On reevaluation pt reports she has vomited again despite no obvious emesis bag nearyby. Given continued symptoms will plan for CT scan for further evaluation.   CT scan: IMPRESSION:  No acute abdominal/pelvic findings, mass lesions or adenopathy.   Pt fluid challenged after CT scan; able to tolerate water without difficulty. Will discharge home at this time with zofran and bentyl and PCP follow up. Pt instructed to push fluids and BLAND diet. She is in agreement with plan and stable for discharge.   This note was prepared using  Dragon voice recognition software and may include unintentional dictation errors due to the inherent limitations of voice recognition software.     Tanda Rockers, PA-C 06/22/20 1046    Virgina Norfolk, DO 06/22/20 1109

## 2020-06-22 NOTE — ED Notes (Signed)
Patient transported to CT 

## 2020-06-22 NOTE — ED Provider Notes (Signed)
[facility] EMERGENCY DEPARTMENT Provider Note   CSN: [id number] Arrival date & time: 06/22/20  0054     History Chief Complaint  Patient presents with  . Abdominal Pain    [facility] is a 40 y.o. female.  Patient presents to the emergency department with a chief complaint of nausea, vomiting, diarrhea.  She states that the symptoms started yesterday.  She reports associated lower crampy abdominal pain.  She denies any fevers or chills.  She reports getting Zofran in triage without much relief.  She states that the pain started first, and then the vomiting came.  She denies any vaginal discharge or bleeding.  She does report dysuria.  The history is provided by the patient. No language interpreter was used.       Past Medical History:  Diagnosis Date  . Anemia   . Anxiety   . Bacterial vaginitis 08/26/2016  . Depression    had counseling in the passt no meds  . History of abnormal cervical Pap smear     Patient Active Problem List   Diagnosis Date Noted  . Depression, postpartum 02/06/2019  . Hypothyroidism 05/13/2017  . History of cesarean delivery 04/03/2017  . Marijuana abuse 09/18/2016  . Cervical high risk HPV (human papillomavirus) test positive with normal cytology 08/2016 09/15/2016  . History of substance abuse (HCC) 09/09/2016  . Tobacco abuse 09/09/2016  . History of depression 09/09/2016    Past Surgical History:  Procedure Laterality Date  . CESAREAN SECTION  2008   x1  . CESAREAN SECTION N/A 04/03/2017   Procedure: CESAREAN SECTION;  Surgeon: Hermina Staggers, MD;  Location: [facility];  Service: Obstetrics;  Laterality: N/A;  . COLPOSCOPY     abnormal pap  . INDUCED ABORTION    . TONSILLECTOMY    . WISDOM TOOTH EXTRACTION     x4     OB History    Gravida  7   Para  3   Term  3   Preterm  0   AB  3   Living  2     SAB  2   IAB  1   Ectopic  0   Multiple  0   Live Births  3            Family History  Problem Relation Age of Onset  . Hypertension Father   . Diabetes Father   . Kidney disease Maternal Grandmother   . Heart disease Maternal Grandmother   . Alzheimer's disease Maternal Grandfather     Social History   Tobacco Use  . Smoking status: Current Every Day Smoker    Packs/day: 0.10    Types: Cigarettes  . Smokeless tobacco: Never Used  Vaping Use  . Vaping Use: Never used  Substance Use Topics  . Alcohol use: No  . Drug use: No    Comment: uses vape a month ago    Home Medications Prior to Admission medications   Medication Sig Start Date End Date Taking? Authorizing Provider  calcium carbonate (TUMS - DOSED IN MG ELEMENTAL CALCIUM) 500 MG chewable tablet Chew 1 tablet by mouth daily.    [provider]  cephALEXin (KEFLEX) 500 MG capsule Take 1 capsule (500 mg total) by mouth 3 (three) times daily. 02/14/19   Reva Bores, MD  diphenhydrAMINE (BENADRYL) 12.5 MG chewable tablet Chew 12.5 mg by mouth 4 (four) times daily as needed for allergies.    [provider]  hydrOXYzine (ATARAX/VISTARIL) 25 MG tablet Take 1 tablet (25 mg total) by mouth every 6 (six) hours as needed for itching. 06/29/19   Federico Flake, MD  Prenatal Vit-Fe Fumarate-FA (PRENATAL VITAMINS PO) Take by mouth.    [provider]  sertraline (ZOLOFT) 100 MG tablet Take 1 tablet (100 mg total) by mouth daily. In 1 week increase to 150 (1.5 pills) daily. 06/29/19   Federico Flake, MD    Allergies    Tamiflu [oseltamivir phosphate], Morphine and related, and Sulfa antibiotics  Review of Systems   Review of Systems  All other systems reviewed and are negative.   Physical Exam Updated Vital Signs BP 134/89 (BP Location: Right Arm)   Pulse 83   Temp 98.2 F (36.8 C) (Oral)   Resp (!) 22   SpO2 100%   Physical Exam Vitals and nursing note reviewed.  Constitutional:      General: She is not in acute distress.    Appearance:  She is well-developed.  HENT:     Head: Normocephalic and atraumatic.  Eyes:     Conjunctiva/sclera: Conjunctivae normal.  Cardiovascular:     Rate and Rhythm: Normal rate and regular rhythm.     Heart sounds: No murmur heard.   Pulmonary:     Effort: Pulmonary effort is normal. No respiratory distress.     Breath sounds: Normal breath sounds.  Abdominal:     Palpations: Abdomen is soft.     Tenderness: There is no abdominal tenderness.     Comments: Generalized abdominal discomfort  Musculoskeletal:        General: Normal range of motion.     Cervical back: Neck supple.  Skin:    General: Skin is warm and dry.  Neurological:     Mental Status: She is alert and oriented to person, place, and time.  Psychiatric:        Mood and Affect: Mood normal.        Behavior: Behavior normal.     ED Results / Procedures / Treatments   Labs (all labs ordered are listed, but only abnormal results are displayed) Labs Reviewed  COMPREHENSIVE METABOLIC PANEL - Abnormal; Notable for the following components:      Result Value   Glucose, Bld 164 (*)    All other components within normal limits  CBC - Abnormal; Notable for the following components:   Hemoglobin 15.5 (*)    All other components within normal limits  LIPASE, BLOOD  URINALYSIS, ROUTINE W REFLEX MICROSCOPIC  I-STAT BETA HCG BLOOD, ED ([facility], [facility], [facility] ONLY)    EKG None  Radiology No results found.  Procedures Procedures   Medications Ordered in ED Medications  sodium chloride 0.9 % bolus 1,000 mL (has no administration in time range)  ondansetron (ZOFRAN) injection 4 mg (has no administration in time range)  fentaNYL (SUBLIMAZE) injection 50 mcg (has no administration in time range)  ondansetron (ZOFRAN-ODT) disintegrating tablet 4 mg (4 mg Oral Given 06/22/20 0106)    ED Course  I have reviewed the triage vital signs and the nursing notes.  Pertinent labs & imaging results that were available during my care of the  patient were reviewed by me and considered in my medical decision making (see chart for details).    MDM Rules/Calculators/A&P                          This patient complains of sudden onset nausea, vomiting,  diarrhea, and abdominal pain, this involves an extensive number of treatment options, and is a complaint that carries with it a high risk of complications and morbidity.   Abdomen is soft and nontender.  She has not exhibit any focal abdominal tenderness.  I think that based on the sudden onset of the symptoms, surgical or acute abdomen is less likely.  Differential Dx Gastroenteritis, cholecystitis, cholelithiasis, pyelonephritis, diverticulitis  Pertinent Labs I ordered, reviewed, and interpreted labs, which included CBC shows no leukocytosis, no evidence of anemia, no significant electrolyte derangement, pregnancy test negative, urinalysis is not consistent with infection.  Medications I ordered medication Zofran and fluids for nausea and vomiting.  Reassessments After the interventions stated above, I reevaluated the patient and found still feeling achy.  Another round of fluid is ordered.  Will p.o. challenge patient.  Plan Anticipate discharge after second fluid bolus passes fluid challenge.  Will need repeat abdominal exam.  6:31 AM Patient signed out at shift change to [place], [place].   Final Clinical Impression(s) / ED Diagnoses Final diagnoses:  Nausea vomiting and diarrhea  Generalized abdominal pain    Rx / DC Orders ED Discharge Orders    None       Roxy Horseman, PA-C 06/22/20 2956    Glynn Octave, MD 06/22/20 [phone]

## 2020-06-25 ENCOUNTER — Telehealth: Payer: Self-pay | Admitting: *Deleted

## 2020-06-25 NOTE — Telephone Encounter (Signed)
Transition Care Management Unsuccessful Follow-up Telephone Call  Date of discharge and from where:  06/22/2020 - [facility]  Attempts:  1st Attempt  Reason for unsuccessful TCM follow-up call:  Voice mail full

## 2020-06-26 DIAGNOSIS — F418 Other specified anxiety disorders: Secondary | ICD-10-CM | POA: Diagnosis not present

## 2020-06-26 DIAGNOSIS — F3489 Other specified persistent mood disorders: Secondary | ICD-10-CM | POA: Diagnosis not present

## 2020-06-26 NOTE — Telephone Encounter (Signed)
Transition Care Management Unsuccessful Follow-up Telephone Call  Date of discharge and from where:  06/22/2020 - [facility]  Attempts:  2nd Attempt  Reason for unsuccessful TCM follow-up call:  Voice mail full

## 2020-06-27 NOTE — Telephone Encounter (Signed)
Transition Care Management Unsuccessful Follow-up Telephone Call  Date of discharge and from where:  06/22/2020 - [facility]  Attempts:  3rd Attempt  Reason for unsuccessful TCM follow-up call:  Voice mail full

## 2020-07-03 DIAGNOSIS — M129 Arthropathy, unspecified: Secondary | ICD-10-CM | POA: Diagnosis not present

## 2020-07-03 DIAGNOSIS — Z1159 Encounter for screening for other viral diseases: Secondary | ICD-10-CM | POA: Diagnosis not present

## 2020-07-03 DIAGNOSIS — Z79899 Other long term (current) drug therapy: Secondary | ICD-10-CM | POA: Diagnosis not present

## 2020-07-03 DIAGNOSIS — E559 Vitamin D deficiency, unspecified: Secondary | ICD-10-CM | POA: Diagnosis not present

## 2020-07-12 DIAGNOSIS — M47816 Spondylosis without myelopathy or radiculopathy, lumbar region: Secondary | ICD-10-CM | POA: Diagnosis not present

## 2020-07-12 DIAGNOSIS — M7918 Myalgia, other site: Secondary | ICD-10-CM | POA: Diagnosis not present

## 2020-07-30 DIAGNOSIS — Z79899 Other long term (current) drug therapy: Secondary | ICD-10-CM | POA: Diagnosis not present

## 2020-08-16 DIAGNOSIS — Z79899 Other long term (current) drug therapy: Secondary | ICD-10-CM | POA: Diagnosis not present

## 2020-08-16 DIAGNOSIS — R5383 Other fatigue: Secondary | ICD-10-CM | POA: Diagnosis not present

## 2020-09-24 DIAGNOSIS — Z79899 Other long term (current) drug therapy: Secondary | ICD-10-CM | POA: Diagnosis not present

## 2020-10-07 ENCOUNTER — Ambulatory Visit
Admission: EM | Admit: 2020-10-07 | Discharge: 2020-10-07 | Disposition: A | Discharge status: Home / Self Care | Payer: Medicaid Other | Attending: Physician Assistant | Admitting: Physician Assistant

## 2020-10-07 ENCOUNTER — Other Ambulatory Visit: Payer: Self-pay

## 2020-10-07 DIAGNOSIS — R197 Diarrhea, unspecified: Secondary | ICD-10-CM

## 2020-10-07 DIAGNOSIS — R112 Nausea with vomiting, unspecified: Secondary | ICD-10-CM | POA: Diagnosis not present

## 2020-10-07 LAB — POCT URINALYSIS DIP (DEVICE)
Bilirubin Urine: NEGATIVE
Glucose, UA: NEGATIVE mg/dL
Hgb urine dipstick: NEGATIVE
Leukocytes,Ua: NEGATIVE
Nitrite: NEGATIVE
Protein, ur: 100 mg/dL — AB
Specific Gravity, Urine: 1.015 (ref 1.005–1.030)
Urobilinogen, UA: 0.2 mg/dL (ref 0.0–1.0)
pH: >=9 (ref 5.0–8.0)

## 2020-10-07 LAB — POCT PREGNANCY, URINE: Preg Test, Ur: NEGATIVE

## 2020-10-07 MED ORDER — ONDANSETRON 8 MG PO TBDP: 8.0000 mg | ORAL_TABLET | Freq: Three times a day (TID) | ORAL | 0 refills | Status: AC | PRN

## 2020-10-07 NOTE — Discharge Instructions (Addendum)
ABDOMINAL PAIN/GASTROENTERITIS: Use medications as directed including antiemetics and antidiarrheal medications. You must increase fluids and electrolyte replacement, as well as rest over these next several days. If you have any questions or concerns, or if your symptoms are not improving or if especially if they acutely worsen, please call or stop back to the clinic immediately and we will be happy to help you or go to the ER   ABDOMINAL PAIN RED FLAGS: Seek immediate further care if: symptoms last more than 2 days, you are unable to keep fluids down, you see blood or mucus in your stool, you vomit black or dark red material, you have a fever of 101.F or higher, you have localized and/or persistent abdominal pain

## 2020-10-07 NOTE — ED Triage Notes (Signed)
Pt states she woke up this morning at 2 am with  Diarrhea, vomiting and cold sweats. Took Zofran at 3am and then vomit later.  Has been dry heaving since, last vomit was at 8am.

## 2020-10-07 NOTE — ED Provider Notes (Signed)
[facility]    CSN: [id number] Arrival date & time: 10/07/20  0737      History   Chief Complaint Chief Complaint  Patient presents with   Diarrhea   Emesis    HPI Holly Gordon is a 40 y.o. female presenting for onset of nausea/vomiting, diarrhea, fatigue and cold sweats this morning at about 2:30 AM.  Patient states that she took a 4 mg Zofran at 3 AM and vomited 15 minutes later.  She said it was ODT Zofran.  The last time that she vomited was about an hour and a half ago.  Patient admits to about 6 episodes of watery diarrhea.  Denies any black or bloody stools.  She admits to allover abdominal cramping but denies any severe pain.  She denies any dysuria, urinary frequency or urgency.  No abnormal vaginal discharge/itching or odor and denies any concern for STIs.  Patient says she had similar episode 4 months ago and went to the emergency department where she had lab work, CT scan and received "pain medication and IV fluids and felt better."  Patient denies ever having the issue before that.  She denies any contact with anyone with similar symptoms.  Patient denies eating any raw or uncooked meat.  No history of GI issues or IBD or IBS.  Patient has no other complaints or concerns.  HPI  Past Medical History:  Diagnosis Date   Anemia    Anxiety    Bacterial vaginitis 08/26/2016   Depression    had counseling in the passt no meds   History of abnormal cervical Pap smear     Patient Active Problem List   Diagnosis Date Noted   Depression, postpartum 02/06/2019   Hypothyroidism 05/13/2017   History of cesarean delivery 04/03/2017   Marijuana abuse 09/18/2016   Cervical high risk HPV (human papillomavirus) test positive with normal cytology 08/2016 09/15/2016   History of substance abuse (HCC) 09/09/2016   Tobacco abuse 09/09/2016   History of depression 09/09/2016    Past Surgical History:  Procedure Laterality Date   CESAREAN SECTION  2008   x1    CESAREAN SECTION N/A 04/03/2017   Procedure: CESAREAN SECTION;  Surgeon: Hermina Staggers, MD;  Location: [facility];  Service: Obstetrics;  Laterality: N/A;   COLPOSCOPY     abnormal pap   INDUCED ABORTION     TONSILLECTOMY     WISDOM TOOTH EXTRACTION     x4    OB History     Gravida  7   Para  3   Term  3   Preterm  0   AB  3   Living  2      SAB  2   IAB  1   Ectopic  0   Multiple  0   Live Births  3            Home Medications    Prior to Admission medications   Medication Sig Start Date End Date Taking? Authorizing Provider  clonazePAM (KLONOPIN) 0.5 MG tablet Take 0.5 mg by mouth daily as needed for anxiety. 03/26/20  Yes [provider]  FLUoxetine (PROZAC) 20 MG capsule Take 20 mg by mouth daily. 09/17/20  Yes [provider]  ibuprofen (ADVIL) 200 MG tablet Take 800 mg by mouth every 6 (six) hours as needed for mild pain.   Yes [provider]  lamoTRIgine (LAMICTAL) 25 MG tablet Take by mouth. 03/26/20  Yes [provider]  ondansetron (ZOFRAN ODT) 8 MG disintegrating tablet Take 1 tablet (8 mg total) by mouth every 8 (eight) hours as needed for up to 5 days for nausea or vomiting. 10/07/20 10/12/20 Yes Shirlee Latch, PA-C  acetaminophen (TYLENOL) 500 MG tablet Take 1,000 mg by mouth every 6 (six) hours as needed for mild pain.    [provider]  busPIRone (BUSPAR) 15 MG tablet Take 15 mg by mouth 3 (three) times daily. 09/24/20   [provider]  calcium carbonate (TUMS - DOSED IN MG ELEMENTAL CALCIUM) 500 MG chewable tablet Chew 1 tablet by mouth daily as needed for indigestion.    [provider]  dicyclomine (BENTYL) 20 MG tablet Take 1 tablet (20 mg total) by mouth 2 (two) times daily. 06/22/20   Tanda Rockers, PA-C  hydrOXYzine (ATARAX/VISTARIL) 25 MG tablet Take 1 tablet (25 mg total) by mouth every 6 (six) hours as needed for itching. Patient not taking: No sig reported 06/29/19    Federico Flake, MD  lamoTRIgine (LAMICTAL) 25 MG tablet Take 25 mg by mouth daily. 03/26/20   [provider]  sertraline (ZOLOFT) 100 MG tablet Take 1 tablet (100 mg total) by mouth daily. In 1 week increase to 150 (1.5 pills) daily. Patient not taking: No sig reported 06/29/19   Federico Flake, MD    Family History Family History  Problem Relation Age of Onset   Hypertension Father    Diabetes Father    Kidney disease Maternal Grandmother    Heart disease Maternal Grandmother    Alzheimer's disease Maternal Grandfather     Social History Social History   Tobacco Use   Smoking status: Every Day    Packs/day: 0.10    Types: Cigarettes   Smokeless tobacco: Never  Vaping Use   Vaping Use: Never used  Substance Use Topics   Alcohol use: No   Drug use: No    Comment: uses vape a month ago     Allergies   Tamiflu [oseltamivir phosphate], Morphine and related, and Sulfa antibiotics   Review of Systems Review of Systems  Constitutional:  Positive for diaphoresis and fatigue. Negative for chills and fever.  HENT:  Negative for congestion, ear pain, rhinorrhea and sore throat.   Respiratory:  Negative for cough and shortness of breath.   Cardiovascular:  Negative for chest pain.  Gastrointestinal:  Positive for abdominal pain, diarrhea, nausea and vomiting.  Genitourinary:  Negative for dysuria, frequency, urgency and vaginal discharge.  Musculoskeletal:  Negative for arthralgias and myalgias.  Skin:  Negative for rash.  Neurological:  Negative for weakness and headaches.    Physical Exam Triage Vital Signs ED Triage Vitals  Enc Vitals Group     BP      Pulse      Resp      Temp      Temp src      SpO2      Weight      Height      Head Circumference      Peak Flow      Pain Score      Pain Loc      Pain Edu?      Excl. in GC?    No data found.  Updated Vital Signs BP 130/70 (BP Location: Left Arm)   Pulse (!) 54   Temp 98 F  (36.7 C) (Oral)   Resp 16   Ht 5\' 4"  (1.626 m)  Wt 165 lb (74.8 kg)   LMP 10/01/2020   SpO2 100%   BMI 28.32 kg/m    Physical Exam Vitals and nursing note reviewed.  Constitutional:      General: She is not in acute distress.    Appearance: Normal appearance. She is not ill-appearing or toxic-appearing.  HENT:     Head: Normocephalic and atraumatic.     Nose: Nose normal.     Mouth/Throat:     Mouth: Mucous membranes are moist.     Pharynx: Oropharynx is clear.  Eyes:     General: No scleral icterus.       Right eye: No discharge.        Left eye: No discharge.     Conjunctiva/sclera: Conjunctivae normal.  Cardiovascular:     Rate and Rhythm: Normal rate and regular rhythm.     Heart sounds: Normal heart sounds.  Pulmonary:     Effort: Pulmonary effort is normal. No respiratory distress.     Breath sounds: Normal breath sounds.  Abdominal:     General: Bowel sounds are normal.     Palpations: Abdomen is soft.     Tenderness: There is abdominal tenderness (Mild TTP periumbilical, suprapubic, LUQ and RLQ). There is no right CVA tenderness, left CVA tenderness, guarding or rebound.  Musculoskeletal:     Cervical back: Neck supple.  Skin:    General: Skin is dry.  Neurological:     General: No focal deficit present.     Mental Status: She is alert. Mental status is at baseline.     Motor: No weakness.     Gait: Gait normal.  Psychiatric:        Mood and Affect: Mood normal.        Behavior: Behavior normal.        Thought Content: Thought content normal.     UC Treatments / Results  Labs (all labs ordered are listed, but only abnormal results are displayed) Labs Reviewed  POCT URINALYSIS DIP (DEVICE) - Abnormal; Notable for the following components:      Result Value   Ketones, ur TRACE (*)    Protein, ur 100 (*)    All other components within normal limits  POCT PREGNANCY, URINE  POCT URINALYSIS DIPSTICK, ED / UC  POC URINE PREG, ED     EKG   Radiology No results found.  Procedures Procedures (including critical care time)  Medications Ordered in UC Medications - No data to display  Initial Impression / Assessment and Plan / UC Course  I have reviewed the triage vital signs and the nursing notes.  Pertinent labs & imaging results that were available during my care of the patient were reviewed by me and considered in my medical decision making (see chart for details).  40 year old female presenting for nausea, vomiting and diarrhea as well as sweats and fatigue that started at 2:30 AM this morning.  No fever, recent illness, exposure to any uncooked foods, recent travel.  She does admit to some diffuse/generalized abdominal pain worse in the lower abdomen.  Similar condition 4 months ago where she was seen in the ED.  I did review ED note and she had reassuring lab work and a normal CT scan.  Patient says her symptoms feel the same presently.  Advised patient this does not appear to be a serious condition at this point since she is afebrile and has no significant or localized abdominal tenderness and had similar symptoms 4 months ago  with a normal work-up.  Advised supportive care at this time for suspected gastroenteritis.  She has 4 mg Zofran ODT at home so I have sent in 8 mg ODT Zofran advised Tylenol for discomfort.  Also encouraged her to increase rest and fluids.  The urinalysis today shows normal specific gravity and her pregnancy test is negative.  Thoroughly reviewed ED precautions with patient and advised her to go to the ED for any symptoms lasting more than a couple of days, fever, increased fatigue or weakness, concerns for dehydration, intractable vomiting, worsening abdominal pain especially in the right lower quadrant or right upper quadrant, black or bloody stools.  Patient agrees to plan.  Final Clinical Impressions(s) / UC Diagnoses   Final diagnoses:  Nausea vomiting and diarrhea     Discharge  Instructions      ABDOMINAL PAIN/GASTROENTERITIS: Use medications as directed including antiemetics and antidiarrheal medications. You must increase fluids and electrolyte replacement, as well as rest over these next several days. If you have any questions or concerns, or if your symptoms are not improving or if especially if they acutely worsen, please call or stop back to the clinic immediately and we will be happy to help you or go to the ER   ABDOMINAL PAIN RED FLAGS: Seek immediate further care if: symptoms last more than 2 days, you are unable to keep fluids down, you see blood or mucus in your stool, you vomit black or dark red material, you have a fever of 101.F or higher, you have localized and/or persistent abdominal pain      ED Prescriptions     Medication Sig Dispense Auth. Provider   ondansetron (ZOFRAN ODT) 8 MG disintegrating tablet Take 1 tablet (8 mg total) by mouth every 8 (eight) hours as needed for up to 5 days for nausea or vomiting. 15 tablet Gareth Morgan      PDMP not reviewed this encounter.   Shirlee Latch, PA-C 10/07/20 1055

## 2020-11-22 DIAGNOSIS — Z79899 Other long term (current) drug therapy: Secondary | ICD-10-CM | POA: Diagnosis not present

## 2020-12-20 DIAGNOSIS — Z79899 Other long term (current) drug therapy: Secondary | ICD-10-CM | POA: Diagnosis not present

## 2021-01-22 DIAGNOSIS — R5383 Other fatigue: Secondary | ICD-10-CM | POA: Diagnosis not present

## 2021-01-22 DIAGNOSIS — E559 Vitamin D deficiency, unspecified: Secondary | ICD-10-CM | POA: Diagnosis not present

## 2021-01-22 DIAGNOSIS — Z131 Encounter for screening for diabetes mellitus: Secondary | ICD-10-CM | POA: Diagnosis not present

## 2021-01-22 DIAGNOSIS — Z0000 Encounter for general adult medical examination without abnormal findings: Secondary | ICD-10-CM | POA: Diagnosis not present

## 2021-01-22 DIAGNOSIS — Z13220 Encounter for screening for lipoid disorders: Secondary | ICD-10-CM | POA: Diagnosis not present

## 2021-03-11 DIAGNOSIS — M5137 Other intervertebral disc degeneration, lumbosacral region: Secondary | ICD-10-CM | POA: Diagnosis not present

## 2021-03-11 DIAGNOSIS — J209 Acute bronchitis, unspecified: Secondary | ICD-10-CM | POA: Diagnosis not present

## 2021-03-11 DIAGNOSIS — F17210 Nicotine dependence, cigarettes, uncomplicated: Secondary | ICD-10-CM | POA: Diagnosis not present

## 2021-03-11 DIAGNOSIS — Z6831 Body mass index (BMI) 31.0-31.9, adult: Secondary | ICD-10-CM | POA: Diagnosis not present

## 2021-03-11 DIAGNOSIS — R052 Subacute cough: Secondary | ICD-10-CM | POA: Diagnosis not present

## 2021-03-11 DIAGNOSIS — E559 Vitamin D deficiency, unspecified: Secondary | ICD-10-CM | POA: Diagnosis not present

## 2021-03-11 DIAGNOSIS — Z3200 Encounter for pregnancy test, result unknown: Secondary | ICD-10-CM | POA: Diagnosis not present

## 2021-03-20 DIAGNOSIS — Z79899 Other long term (current) drug therapy: Secondary | ICD-10-CM | POA: Diagnosis not present

## 2021-03-22 DIAGNOSIS — Z79899 Other long term (current) drug therapy: Secondary | ICD-10-CM | POA: Diagnosis not present

## 2021-04-20 ENCOUNTER — Emergency Department (HOSPITAL_COMMUNITY)
Admission: EM | Admit: 2021-04-20 | Discharge: 2021-04-21 | Disposition: A | Discharge status: Home / Self Care | Payer: Medicaid Other | Attending: Emergency Medicine | Admitting: Emergency Medicine

## 2021-04-20 ENCOUNTER — Other Ambulatory Visit: Payer: Self-pay

## 2021-04-20 DIAGNOSIS — R1110 Vomiting, unspecified: Secondary | ICD-10-CM | POA: Diagnosis not present

## 2021-04-20 DIAGNOSIS — Z79899 Other long term (current) drug therapy: Secondary | ICD-10-CM | POA: Diagnosis not present

## 2021-04-20 DIAGNOSIS — I700 Atherosclerosis of aorta: Secondary | ICD-10-CM | POA: Diagnosis not present

## 2021-04-20 DIAGNOSIS — R197 Diarrhea, unspecified: Secondary | ICD-10-CM | POA: Diagnosis not present

## 2021-04-20 DIAGNOSIS — R109 Unspecified abdominal pain: Secondary | ICD-10-CM | POA: Diagnosis not present

## 2021-04-20 DIAGNOSIS — R112 Nausea with vomiting, unspecified: Secondary | ICD-10-CM | POA: Insufficient documentation

## 2021-04-20 DIAGNOSIS — R9431 Abnormal electrocardiogram [ECG] [EKG]: Secondary | ICD-10-CM | POA: Diagnosis not present

## 2021-04-20 LAB — CBC WITH DIFFERENTIAL/PLATELET
Abs Immature Granulocytes: 0.07 10*3/uL (ref 0.00–0.07)
Basophils Absolute: 0.1 10*3/uL (ref 0.0–0.1)
Basophils Relative: 1 %
Eosinophils Absolute: 0 10*3/uL (ref 0.0–0.5)
Eosinophils Relative: 0 %
HCT: 41.7 % (ref 36.0–46.0)
Hemoglobin: 13.8 g/dL (ref 12.0–15.0)
Immature Granulocytes: 1 %
Lymphocytes Relative: 9 %
Lymphs Abs: 1.4 10*3/uL (ref 0.7–4.0)
MCH: 32.2 pg (ref 26.0–34.0)
MCHC: 33.1 g/dL (ref 30.0–36.0)
MCV: 97.2 fL (ref 80.0–100.0)
Monocytes Absolute: 1 10*3/uL (ref 0.1–1.0)
Monocytes Relative: 6 %
Neutro Abs: 12.7 10*3/uL — ABNORMAL HIGH (ref 1.7–7.7)
Neutrophils Relative %: 83 %
Platelets: 261 10*3/uL (ref 150–400)
RBC: 4.29 MIL/uL (ref 3.87–5.11)
RDW: 12.5 % (ref 11.5–15.5)
WBC: 15.2 10*3/uL — ABNORMAL HIGH (ref 4.0–10.5)
nRBC: 0 % (ref 0.0–0.2)

## 2021-04-20 LAB — COMPREHENSIVE METABOLIC PANEL
ALT: 20 U/L (ref 0–44)
AST: 25 U/L (ref 15–41)
Albumin: 4.4 g/dL (ref 3.5–5.0)
Alkaline Phosphatase: 85 U/L (ref 38–126)
Anion gap: 12 (ref 5–15)
BUN: 5 mg/dL — ABNORMAL LOW (ref 6–20)
CO2: 25 mmol/L (ref 22–32)
Calcium: 9.5 mg/dL (ref 8.9–10.3)
Chloride: 102 mmol/L (ref 98–111)
Creatinine, Ser: 0.95 mg/dL (ref 0.44–1.00)
GFR, Estimated: >60 mL/min (ref >60)
Glucose, Bld: 203 mg/dL — ABNORMAL HIGH (ref 70–99)
Potassium: 3.6 mmol/L (ref 3.5–5.1)
Sodium: 139 mmol/L (ref 135–145)
Total Bilirubin: 0.4 mg/dL (ref 0.3–1.2)
Total Protein: 7.1 g/dL (ref 6.5–8.1)

## 2021-04-20 LAB — I-STAT BETA HCG BLOOD, ED (MC, WL, AP ONLY): I-stat hCG, quantitative: <5 m[IU]/mL (ref <5)

## 2021-04-20 LAB — LIPASE, BLOOD: Lipase: 26 U/L (ref 11–51)

## 2021-04-20 MED ORDER — ONDANSETRON HCL 4 MG/2ML IJ SOLN
4.0000 mg | Freq: Once | INTRAMUSCULAR | Status: AC
  Administered 2021-04-20: 4 mg via INTRAVENOUS
  Filled 2021-04-20: qty 2

## 2021-04-20 MED ORDER — SODIUM CHLORIDE 0.9 % IV BOLUS
1000.0000 mL | Freq: Once | INTRAVENOUS | Status: AC
  Administered 2021-04-20: 1000 mL via INTRAVENOUS

## 2021-04-20 NOTE — ED Triage Notes (Signed)
Pt c/o nausea and vomiting since 1800. Pt also c/o chest pain.

## 2021-04-20 NOTE — ED Notes (Signed)
Unable to provide urine specimen at this time.  

## 2021-04-20 NOTE — ED Provider Triage Note (Addendum)
°  Emergency Medicine Provider Triage Evaluation Note  MRN:  [id number]  Arrival date & time: 04/20/21    Medically screening exam initiated at 10:57 PM.   History provided by patient. CC:   Emesis   HPI:  Holly Gordon is a 41 y.o. year-old female presents to the ED with chief complaint of abdominal pain, nausea, vomiting, and diarrhea.  Onset this evening.  Denies fever.  Denies sick contacts. Prior C-section, but no other reported abdominal surgeries.  ROS:  -As included in HPI PE:   Vitals:   04/20/21 2300  BP: 134/65  Pulse: 68  Resp: (!) 30  Temp: 98.5 F (36.9 C)  SpO2: 100%    Non-toxic appearing No respiratory distress Generalized abdominal discomfort MDM:  I am concerned about persistent vomiting and have ordered labs and IV fluid to further assess and treat.   Patient was informed that the remainder of the evaluation will be completed by another provider, this initial triage assessment does not replace that evaluation, and the importance of remaining in the ED until their evaluation is complete.    Roxy Horseman, PA-C 04/20/21 2259    Roxy Horseman, PA-C 04/20/21 2302

## 2021-04-21 ENCOUNTER — Emergency Department (HOSPITAL_COMMUNITY): Payer: Medicaid Other

## 2021-04-21 DIAGNOSIS — R109 Unspecified abdominal pain: Secondary | ICD-10-CM | POA: Diagnosis not present

## 2021-04-21 DIAGNOSIS — I700 Atherosclerosis of aorta: Secondary | ICD-10-CM | POA: Diagnosis not present

## 2021-04-21 DIAGNOSIS — R1110 Vomiting, unspecified: Secondary | ICD-10-CM | POA: Diagnosis not present

## 2021-04-21 LAB — URINALYSIS, ROUTINE W REFLEX MICROSCOPIC
Bilirubin Urine: NEGATIVE
Glucose, UA: NEGATIVE mg/dL
Hgb urine dipstick: NEGATIVE
Ketones, ur: 5 mg/dL — AB
Leukocytes,Ua: NEGATIVE
Nitrite: NEGATIVE
Protein, ur: 30 mg/dL — AB
Specific Gravity, Urine: 1.013 (ref 1.005–1.030)
pH: 9 — ABNORMAL HIGH (ref 5.0–8.0)

## 2021-04-21 MED ORDER — DICYCLOMINE HCL 10 MG PO CAPS
10.0000 mg | ORAL_CAPSULE | Freq: Once | ORAL | Status: AC
  Administered 2021-04-21: 10 mg via ORAL
  Filled 2021-04-21: qty 1

## 2021-04-21 MED ORDER — OMEPRAZOLE 20 MG PO CPDR: 20.0000 mg | DELAYED_RELEASE_CAPSULE | Freq: Every day | ORAL | 0 refills | Status: AC

## 2021-04-21 MED ORDER — PANTOPRAZOLE SODIUM 40 MG IV SOLR
40.0000 mg | Freq: Once | INTRAVENOUS | Status: AC
  Administered 2021-04-21: 40 mg via INTRAVENOUS
  Filled 2021-04-21: qty 10

## 2021-04-21 MED ORDER — IOHEXOL 300 MG/ML  SOLN
100.0000 mL | Freq: Once | INTRAMUSCULAR | Status: AC | PRN
  Administered 2021-04-21: 100 mL via INTRAVENOUS

## 2021-04-21 MED ORDER — PROMETHAZINE HCL 25 MG/ML IJ SOLN
25.0000 mg | Freq: Once | INTRAMUSCULAR | Status: AC
  Administered 2021-04-21: 25 mg via INTRAVENOUS
  Filled 2021-04-21: qty 1

## 2021-04-21 MED ORDER — ONDANSETRON 4 MG PO TBDP: 4.0000 mg | ORAL_TABLET | Freq: Three times a day (TID) | ORAL | 0 refills | Status: DC | PRN

## 2021-04-21 NOTE — ED Notes (Signed)
Oral hydration provided  

## 2021-04-21 NOTE — ED Provider Notes (Signed)
[facility]   CSN: [id number] Arrival date & time: 04/20/21  2243     History  Chief Complaint  Patient presents with   Emesis    Larene PickettStephanie M Serna is a 41 y.o. female.  HPI     This is a 41 year old female with no reported past medical history who presents with abdominal pain, nausea, vomiting, and diarrhea.  Patient reports onset of symptoms around 6 PM last night.  No known sick contacts.  Denies fevers.  Reports crampy lower abdominal pain.  Reports nonbilious, nonbloody emesis.  Denies urinary symptoms or back pain.  Does not believe herself to be pregnant.  Prior surgical history includes C-section.  Patient does endorse daily marijuana use.  Home Medications Prior to Admission medications   Medication Sig Start Date End Date Taking? Authorizing Provider  acetaminophen (TYLENOL) 500 MG tablet Take 1,000 mg by mouth every 6 (six) hours as needed for mild pain.   Yes [provider]  busPIRone (BUSPAR) 15 MG tablet Take 15 mg by mouth 3 (three) times daily. 09/24/20  Yes [provider]  calcium carbonate (TUMS - DOSED IN MG ELEMENTAL CALCIUM) 500 MG chewable tablet Chew 1 tablet by mouth daily as needed for indigestion.   Yes [provider]  carbamazepine (CARBATROL) 200 MG 12 hr capsule Take 200 mg by mouth 2 (two) times daily. 03/21/21  Yes [provider]  clonazePAM (KLONOPIN) 0.5 MG tablet Take 0.5 mg by mouth daily as needed for anxiety. 03/26/20  Yes [provider]  FLUoxetine (PROZAC) 40 MG capsule Take 40 mg by mouth daily. 09/17/20  Yes [provider]  ibuprofen (ADVIL) 200 MG tablet Take 800 mg by mouth every 6 (six) hours as needed for mild pain.   Yes [provider]  lamoTRIgine (LAMICTAL) 200 MG tablet Take 200 mg by mouth daily. 04/06/21  Yes [provider]  levocetirizine (XYZAL) 5 MG tablet Take 5 mg by mouth at bedtime. 03/25/21  Yes  [provider]  omeprazole (PRILOSEC) 20 MG capsule Take 20 mg by mouth daily. 03/25/21  Yes [provider]  omeprazole (PRILOSEC) 20 MG capsule Take 1 capsule (20 mg total) by mouth daily. 04/21/21  Yes Madyn Ivins, Mayer Maskerourtney F, MD  ondansetron (ZOFRAN-ODT) 4 MG disintegrating tablet Take 1 tablet (4 mg total) by mouth every 8 (eight) hours as needed for nausea or vomiting. 04/21/21  Yes Latonga Ponder, Mayer Maskerourtney F, MD  VENTOLIN HFA 108 (90 Base) MCG/ACT inhaler Inhale 1-2 puffs into the lungs 4 (four) times daily as needed for wheezing or shortness of breath. 03/11/21  Yes [provider]  Vitamin D, Ergocalciferol, (DRISDOL) 1.25 MG (50000 UNIT) CAPS capsule Take 50,000 Units by mouth every Monday. 03/25/21  Yes [provider]  dicyclomine (BENTYL) 20 MG tablet Take 1 tablet (20 mg total) by mouth 2 (two) times daily. Patient not taking: Reported on 04/21/2021 06/22/20   Tanda RockersVenter, Margaux, PA-C  hydrOXYzine (ATARAX/VISTARIL) 25 MG tablet Take 1 tablet (25 mg total) by mouth every 6 (six) hours as needed for itching. Patient not taking: No sig reported 06/29/19   Federico FlakeNewton, Kimberly Niles, MD  sertraline (ZOLOFT) 100 MG tablet Take 1 tablet (100 mg total) by mouth daily. In 1 week increase to 150 (1.5 pills) daily. Patient not taking: Reported on 06/22/2020 06/29/19   Federico FlakeNewton, Kimberly Niles, MD      Allergies    Tamiflu [oseltamivir phosphate], Morphine and related, and Sulfa antibiotics  Review of Systems   Review of Systems  Constitutional:  Negative for fever.  Cardiovascular:  Negative for chest pain.  Gastrointestinal:  Positive for abdominal pain, diarrhea, nausea and vomiting.  All other systems reviewed and are negative.  Physical Exam Updated Vital Signs BP 131/89    Pulse 68    Temp 98.5 F (36.9 C) (Oral)    Resp 18    Ht 1.626 m (5\' 4" )    Wt 86.2 kg    SpO2 99%    BMI 32.61 kg/m  Physical Exam Vitals and nursing note reviewed.  Constitutional:      Appearance:  She is well-developed. She is obese. She is not ill-appearing.  HENT:     Head: Normocephalic and atraumatic.     Mouth/Throat:     Mouth: Mucous membranes are moist.  Eyes:     Pupils: Pupils are equal, round, and reactive to light.  Cardiovascular:     Rate and Rhythm: Normal rate and regular rhythm.     Heart sounds: Normal heart sounds.  Pulmonary:     Effort: Pulmonary effort is normal. No respiratory distress.     Breath sounds: No wheezing.  Abdominal:     General: Bowel sounds are normal.     Palpations: Abdomen is soft.     Tenderness: There is abdominal tenderness. There is no guarding or rebound.     Comments: Diffuse tenderness to palpation, no rebound or guarding  Musculoskeletal:     Cervical back: Neck supple.  Skin:    General: Skin is warm and dry.  Neurological:     Mental Status: She is alert and oriented to person, place, and time.  Psychiatric:        Mood and Affect: Mood normal.    ED Results / Procedures / Treatments   Labs (all labs ordered are listed, but only abnormal results are displayed) Labs Reviewed  COMPREHENSIVE METABOLIC PANEL - Abnormal; Notable for the following components:      Result Value   Glucose, Bld 203 (*)    BUN 5 (*)    All other components within normal limits  CBC WITH DIFFERENTIAL/PLATELET - Abnormal; Notable for the following components:   WBC 15.2 (*)    Neutro Abs 12.7 (*)    All other components within normal limits  URINALYSIS, ROUTINE W REFLEX MICROSCOPIC - Abnormal; Notable for the following components:   APPearance HAZY (*)    pH 9.0 (*)    Ketones, ur 5 (*)    Protein, ur 30 (*)    Bacteria, UA RARE (*)    All other components within normal limits  LIPASE, BLOOD  I-STAT BETA HCG BLOOD, ED ([facility], [facility], [facility] ONLY)    EKG EKG Interpretation  Date/Time:  Sunday April 21 2021 00:41:53 EST Ventricular Rate:  57 PR Interval:  242 QRS Duration: 97 QT Interval:  446 QTC Calculation: 435 R Axis:   7 Text  Interpretation: Sinus arrhythmia Prolonged PR interval Abnormal R-wave progression, early transition Confirmed by [id number] (Ross Marcus) on 04/21/2021 3:03:23 AM  Radiology CT ABDOMEN PELVIS W CONTRAST  Result Date: 04/21/2021 CLINICAL DATA:  Mid abdominal pain with nausea and vomiting. EXAM: CT ABDOMEN AND PELVIS WITH CONTRAST TECHNIQUE: Multidetector CT imaging of the abdomen and pelvis was performed using the standard protocol following bolus administration of intravenous contrast. RADIATION DOSE REDUCTION: This exam was performed according to the departmental dose-optimization program which includes automated exposure control, adjustment of the mA and/or kV according  to patient size and/or use of iterative reconstruction technique. CONTRAST:  OMNIPAQUE IOHEXOL 300 MG/ML  SOLN COMPARISON:  June 22, 2020 FINDINGS: Lower chest: No acute abnormality. Hepatobiliary: No focal liver abnormality is seen. No gallstones, gallbladder wall thickening, or biliary dilatation. Pancreas: Unremarkable. No pancreatic ductal dilatation or surrounding inflammatory changes. Spleen: Normal in size without focal abnormality. Adrenals/Urinary Tract: Adrenal glands are unremarkable. Kidneys are normal in size, without renal calculi or hydronephrosis. A subcentimeter simple cyst is seen within the upper pole of the right kidney. Bladder is unremarkable. Stomach/Bowel: Very mild, stable thickening of the distal esophagus is seen. Stomach is within normal limits. Appendix appears normal. No evidence of bowel wall thickening, distention, or inflammatory changes. Vascular/Lymphatic: Aortic atherosclerosis. No enlarged abdominal or pelvic lymph nodes. Reproductive: The uterus is normal in appearance. A 2.1 cm diameter cyst is noted within the right adnexa. Other: No abdominal wall hernia or abnormality. No abdominopelvic ascites. Musculoskeletal: No acute or significant osseous findings. IMPRESSION: 1. Very mild, stable  thickening of the distal esophagus which may represent sequelae associated with esophagitis. 2. 2.1 cm diameter right adnexal cyst, likely ovarian in origin. 3. Aortic atherosclerosis. Aortic Atherosclerosis (ICD10-I70.0). Electronically Signed   By: Aram Candela M.D.   On: 04/21/2021 03:09    Procedures Procedures    Medications Ordered in ED Medications  ondansetron (ZOFRAN) injection 4 mg (4 mg Intravenous Given 04/20/21 2355)  sodium chloride 0.9 % bolus 1,000 mL (0 mLs Intravenous Stopped 04/21/21 0038)  promethazine (PHENERGAN) 25 mg in sodium chloride 0.9 % 50 mL IVPB (0 mg Intravenous Stopped 04/21/21 0150)  iohexol (OMNIPAQUE) 300 MG/ML solution 100 mL (100 mLs Intravenous Contrast Given 04/21/21 0303)  pantoprazole (PROTONIX) injection 40 mg (40 mg Intravenous Given 04/21/21 0352)  dicyclomine (BENTYL) capsule 10 mg (10 mg Oral Given 04/21/21 6945)    ED Course/ Medical Decision Making/ A&P                           Medical Decision Making Amount and/or Complexity of Data Reviewed Radiology: ordered.  Risk Prescription drug management.   This patient presents to the ED for concern of nausea, vomiting, diarrhea, this involves an extensive number of treatment options, and is a complaint that carries with it a high risk of complications and morbidity.  The differential diagnosis includes gastritis, gastroenteritis, cannabinoid hyperemesis, less likely SBO or intra-abdominal pathology  MDM:    Patient presents with nausea, vomiting, diarrhea.  She is nontoxic.  She has diffuse tenderness without localization of the abdomen.  She was given pain and nausea medication as well as fluids.  Labs obtained.  She does have leukocytosis with left shift.  Otherwise no significant metabolic derangements.  Normal lipase.  She is afebrile.  While she has a nonlocalizing exam, leukocytosis could be concerning for intra-abdominal pathology.  For this reason we will obtain CT abdomen.  CT abdomen  obtained and shows no evidence of appendicitis or cholecystitis.  She does have some esophageal thickening concerning for esophagitis.  She was able to tolerate fluids.  Discussed with patient supportive measures at home such as Zofran and omeprazole.  Recommend that she discontinue marijuana use as this may also be contributing. (Labs, imaging)  Labs: I Ordered, and personally interpreted labs.  The pertinent results include: Leukocytosis  Imaging Studies ordered: I ordered imaging studies including CT abdomen pelvis with evidence of esophagitis.  Incidental ovarian cyst I independently visualized and interpreted imaging. I  agree with the radiologist interpretation  Additional history obtained from family at bedside.  External records from outside source obtained and reviewed including prior visits  Critical Interventions: IV fluids, Zofran  Consultations: I requested consultation with the none,  and discussed lab and imaging findings as well as pertinent plan - they recommend: None  Cardiac Monitoring: The patient was maintained on a cardiac monitor.  I personally viewed and interpreted the cardiac monitored which showed an underlying rhythm of: Normal sinus  Reevaluation: After the interventions noted above, I reevaluated the patient and found that they have :improved   Considered admission for: Intractable nausea vomiting  Social Determinants of Health: Lives independently  Disposition: Discharge  Co morbidities that complicate the patient evaluation  Past Medical History:  Diagnosis Date   Anemia    Anxiety    Bacterial vaginitis 08/26/2016   Depression    had counseling in the passt no meds   History of abnormal cervical Pap smear      Medicines Meds ordered this encounter  Medications   ondansetron (ZOFRAN) injection 4 mg   sodium chloride 0.9 % bolus 1,000 mL   promethazine (PHENERGAN) 25 mg in sodium chloride 0.9 % 50 mL IVPB   iohexol (OMNIPAQUE) 300 MG/ML  solution 100 mL   pantoprazole (PROTONIX) injection 40 mg   dicyclomine (BENTYL) capsule 10 mg   ondansetron (ZOFRAN-ODT) 4 MG disintegrating tablet    Sig: Take 1 tablet (4 mg total) by mouth every 8 (eight) hours as needed for nausea or vomiting.    Dispense:  20 tablet    Refill:  0   omeprazole (PRILOSEC) 20 MG capsule    Sig: Take 1 capsule (20 mg total) by mouth daily.    Dispense:  30 capsule    Refill:  0    I have reviewed the patients home medicines and have made adjustments as needed  Problem List / ED Course: Problem List Items Addressed This Visit   None Visit Diagnoses     Nausea vomiting and diarrhea    -  Primary                   Final Clinical Impression(s) / ED Diagnoses Final diagnoses:  Nausea vomiting and diarrhea    Rx / DC Orders ED Discharge Orders          Ordered    ondansetron (ZOFRAN-ODT) 4 MG disintegrating tablet  Every 8 hours PRN        04/21/21 0446    omeprazole (PRILOSEC) 20 MG capsule  Daily        04/21/21 0446              Shon Baton, MD 04/21/21 [phone]

## 2021-04-21 NOTE — Discharge Instructions (Signed)
You were seen today for nausea, vomiting, and diarrhea.  This may related to a viral illness.  Could also be related to gastritis or ongoing marijuana use.  Take medications as prescribed.  Make sure that you are staying hydrated.  Avoid excessive marijuana use.

## 2021-04-22 ENCOUNTER — Telehealth: Payer: Self-pay | Admitting: *Deleted

## 2021-04-22 NOTE — Telephone Encounter (Signed)
Transition Care Management Unsuccessful Follow-up Telephone Call  Date of discharge and from where:  04/21/2021 [facility]  Attempts:  1st Attempt  Reason for unsuccessful TCM follow-up call:  Left voice message

## 2021-04-23 NOTE — Telephone Encounter (Signed)
Transition Care Management Follow-up Telephone Call Date of discharge and from where: 04/21/2021 - [facility] How have you been since you were released from the hospital? "Feeling a little better" Any questions or concerns? No  Items Reviewed: Did the pt receive and understand the discharge instructions provided? Yes  Medications obtained and verified? Yes  Other? No  Any new allergies since your discharge? No  Dietary orders reviewed? No Do you have support at home? Yes    Functional Questionnaire: (I = Independent and D = Dependent) ADLs: I  Bathing/Dressing- I  Meal Prep- I  Eating- I  Maintaining continence- I  Transferring/Ambulation- I  Managing Meds- I  Follow up appointments reviewed:  PCP Hospital f/u appt confirmed? No  - no PCP Specialist Hospital f/u appt confirmed? No   Are transportation arrangements needed? No  If their condition worsens, is the pt aware to call PCP or go to the Emergency Dept.? Yes Was the patient provided with contact information for the PCP's office or ED? Yes Was to pt encouraged to call back with questions or concerns? Yes

## 2021-05-06 DIAGNOSIS — M545 Low back pain, unspecified: Secondary | ICD-10-CM | POA: Diagnosis not present

## 2021-05-06 DIAGNOSIS — Z79899 Other long term (current) drug therapy: Secondary | ICD-10-CM | POA: Diagnosis not present

## 2021-05-06 DIAGNOSIS — N920 Excessive and frequent menstruation with regular cycle: Secondary | ICD-10-CM | POA: Diagnosis not present

## 2021-05-06 DIAGNOSIS — Z6832 Body mass index (BMI) 32.0-32.9, adult: Secondary | ICD-10-CM | POA: Diagnosis not present

## 2021-05-06 DIAGNOSIS — M5137 Other intervertebral disc degeneration, lumbosacral region: Secondary | ICD-10-CM | POA: Diagnosis not present

## 2021-06-13 ENCOUNTER — Encounter: Payer: Self-pay | Admitting: Emergency Medicine

## 2021-06-13 ENCOUNTER — Emergency Department
Admission: EM | Admit: 2021-06-13 | Discharge: 2021-06-13 | Disposition: A | Discharge status: Home / Self Care | Payer: Medicaid Other | Attending: Emergency Medicine | Admitting: Emergency Medicine

## 2021-06-13 ENCOUNTER — Other Ambulatory Visit: Payer: Self-pay

## 2021-06-13 DIAGNOSIS — R112 Nausea with vomiting, unspecified: Secondary | ICD-10-CM | POA: Diagnosis not present

## 2021-06-13 DIAGNOSIS — R1084 Generalized abdominal pain: Secondary | ICD-10-CM | POA: Diagnosis not present

## 2021-06-13 DIAGNOSIS — G43A0 Cyclical vomiting, not intractable: Secondary | ICD-10-CM | POA: Diagnosis not present

## 2021-06-13 DIAGNOSIS — D72829 Elevated white blood cell count, unspecified: Secondary | ICD-10-CM | POA: Diagnosis not present

## 2021-06-13 DIAGNOSIS — R197 Diarrhea, unspecified: Secondary | ICD-10-CM | POA: Insufficient documentation

## 2021-06-13 DIAGNOSIS — R1115 Cyclical vomiting syndrome unrelated to migraine: Secondary | ICD-10-CM

## 2021-06-13 LAB — COMPREHENSIVE METABOLIC PANEL
ALT: 16 U/L (ref 0–44)
AST: 22 U/L (ref 15–41)
Albumin: 4.3 g/dL (ref 3.5–5.0)
Alkaline Phosphatase: 84 U/L (ref 38–126)
Anion gap: 8 (ref 5–15)
BUN: 7 mg/dL (ref 6–20)
CO2: 23 mmol/L (ref 22–32)
Calcium: 8.8 mg/dL — ABNORMAL LOW (ref 8.9–10.3)
Chloride: 107 mmol/L (ref 98–111)
Creatinine, Ser: 0.76 mg/dL (ref 0.44–1.00)
GFR, Estimated: >60 mL/min (ref >60)
Glucose, Bld: 166 mg/dL — ABNORMAL HIGH (ref 70–99)
Potassium: 3.8 mmol/L (ref 3.5–5.1)
Sodium: 138 mmol/L (ref 135–145)
Total Bilirubin: 0.6 mg/dL (ref 0.3–1.2)
Total Protein: 7.2 g/dL (ref 6.5–8.1)

## 2021-06-13 LAB — URINALYSIS, ROUTINE W REFLEX MICROSCOPIC
Bilirubin Urine: NEGATIVE
Glucose, UA: NEGATIVE mg/dL
Hgb urine dipstick: NEGATIVE
Ketones, ur: NEGATIVE mg/dL
Leukocytes,Ua: NEGATIVE
Nitrite: NEGATIVE
Protein, ur: NEGATIVE mg/dL
Specific Gravity, Urine: 1.012 (ref 1.005–1.030)
pH: 8 (ref 5.0–8.0)

## 2021-06-13 LAB — CBC
HCT: 43.8 % (ref 36.0–46.0)
Hemoglobin: 14 g/dL (ref 12.0–15.0)
MCH: 31.1 pg (ref 26.0–34.0)
MCHC: 32 g/dL (ref 30.0–36.0)
MCV: 97.3 fL (ref 80.0–100.0)
Platelets: 245 10*3/uL (ref 150–400)
RBC: 4.5 MIL/uL (ref 3.87–5.11)
RDW: 12.4 % (ref 11.5–15.5)
WBC: 11.5 10*3/uL — ABNORMAL HIGH (ref 4.0–10.5)
nRBC: 0 % (ref 0.0–0.2)

## 2021-06-13 LAB — PREGNANCY, URINE: Preg Test, Ur: NEGATIVE

## 2021-06-13 LAB — LIPASE, BLOOD: Lipase: 28 U/L (ref 11–51)

## 2021-06-13 MED ORDER — PROMETHAZINE HCL 25 MG/ML IJ SOLN
25.0000 mg | INTRAMUSCULAR | Status: AC
  Administered 2021-06-13: 25 mg via INTRAMUSCULAR
  Filled 2021-06-13: qty 1

## 2021-06-13 MED ORDER — FAMOTIDINE IN NACL 20-0.9 MG/50ML-% IV SOLN
20.0000 mg | Freq: Once | INTRAVENOUS | Status: AC
  Administered 2021-06-13: 20 mg via INTRAVENOUS
  Filled 2021-06-13: qty 50

## 2021-06-13 MED ORDER — PROMETHAZINE HCL 12.5 MG PO TABS: 12.5000 mg | ORAL_TABLET | Freq: Four times a day (QID) | ORAL | 0 refills | Status: DC | PRN

## 2021-06-13 MED ORDER — HYDROMORPHONE HCL 1 MG/ML IJ SOLN
0.5000 mg | INTRAMUSCULAR | Status: AC
  Administered 2021-06-13: 0.5 mg via INTRAVENOUS
  Filled 2021-06-13: qty 1

## 2021-06-13 MED ORDER — ONDANSETRON HCL 4 MG/2ML IJ SOLN
4.0000 mg | INTRAMUSCULAR | Status: AC
  Administered 2021-06-13: 4 mg via INTRAVENOUS
  Filled 2021-06-13: qty 2

## 2021-06-13 MED ORDER — SODIUM CHLORIDE 0.9 % IV BOLUS
1000.0000 mL | Freq: Once | INTRAVENOUS | Status: AC
Start: 2021-06-13 — End: 2021-06-13
  Administered 2021-06-13: 1000 mL via INTRAVENOUS

## 2021-06-13 MED ORDER — ONDANSETRON 4 MG PO TBDP: 4.0000 mg | ORAL_TABLET | Freq: Four times a day (QID) | ORAL | 0 refills | Status: AC | PRN

## 2021-06-13 NOTE — ED Notes (Signed)
AVS with prescriptions provided to and discussed with patient and family member at bedside. Pt verbalizes understanding of discharge instructions and denies any questions or concerns at this time. Pt has ride home. Pt ambulated out of department independently with steady gait.  

## 2021-06-13 NOTE — Discharge Instructions (Signed)
You were seen in the emergency room for abdominal pain. It is important that you follow up closely with your primary care doctor and gastroenterology in the next couple of days. ? ? ?Please return to the emergency room right away if you are to develop a fever, severe nausea, your pain becomes severe or worsens, you are unable to keep food down, begin vomiting any dark or bloody fluid, you develop any dark or bloody stools, feel dehydrated, or other new concerns or symptoms arise. ? ?

## 2021-06-13 NOTE — ED Notes (Signed)
Pt states nausea has improved and pt given sprite and crackers for po challenge.  ?

## 2021-06-13 NOTE — ED Provider Notes (Signed)
? ?[facility] ?Provider Note ? ? ? Event Date/Time  ? First MD Initiated Contact with Patient 06/13/21 1101   ?  (approximate) ? ? ?History  ? ?Abdominal Pain and Emesis ? ? ?HPI ? ?[facility] is a 41 y.o. female who on review of past outside clinic note from February 02, 2019 as a history of postpartum status and tubal ligation. ? ?Patient reports that she woke up this morning at about 7 AM and started feeling extremely nauseated with vomiting.  She reports she had about the same thing happened about a month or so ago and was seen at the ER. ? ?She was in her normal health yesterday.  No changes in health she got up this morning feeling pretty good and all of a sudden started feeling extremely nauseated with multiple episodes of vomiting.  She tried to use Zofran at home but vomited up.  She been vomiting up sort of green vomitus. ? ?Denies any fever.  No chest pain no trouble breathing.  No abdominal pain denies pregnancy.  She does report her abdominal muscles feel very sore from vomiting multiple times. ? ? ?  ? ? ?Physical Exam  ? ?Triage Vital Signs: ?ED Triage Vitals  ?Enc Vitals Group  ?   BP 06/13/21 1001 (!) 167/83  ?   Pulse Rate 06/13/21 1001 67  ?   Resp 06/13/21 1001 16  ?   Temp 06/13/21 1001 98.1 ?F (36.7 ?C)  ?   Temp Source 06/13/21 1001 Oral  ?   SpO2 06/13/21 1001 100 %  ?   Weight 06/13/21 0959 190 lb 0.6 oz (86.2 kg)  ?   Height 06/13/21 0959 5\' 4"  (1.626 m)  ?   Head Circumference --   ?   Peak Flow --   ?   Pain Score 06/13/21 0959 5  ?   Pain Loc --   ?   Pain Edu? --   ?   Excl. in GC? --   ? ? ?Most recent vital signs: ?Vitals:  ? 06/13/21 1427 06/13/21 1427  ?BP:  (!) 159/93  ?Pulse:  63  ?Resp:  16  ?Temp: 98 ?F (36.7 ?C)   ?SpO2:  100%  ? ? ? ?General: Awake, no distress.  She does appear mildly ill and quite nauseated, holding emesis bucket up to her face.  Dry heaving.  Slightly diaphoretic ?CV:  Good peripheral perfusion.  Normal rate and rhythm.  No  murmurs or rubs ?Resp:  Normal effort.  Speaks in full clear sentences. ?Abd:  No distention.  Reports moderate tenderness to palpation throughout.  No obvious focality of symptoms.  No peritonitis.  Patient reports entire abdomen feels very sore. ?Other:  Periphery warm well perfused capillary beds ? ? ? ? ?ED Results / Procedures / Treatments  ? ?Labs ?(all labs ordered are listed, but only abnormal results are displayed) ?Labs Reviewed  ?COMPREHENSIVE METABOLIC PANEL - Abnormal; Notable for the following components:  ?    Result Value  ? Glucose, Bld 166 (*)   ? Calcium 8.8 (*)   ? All other components within normal limits  ?CBC - Abnormal; Notable for the following components:  ? WBC 11.5 (*)   ? All other components within normal limits  ?URINALYSIS, ROUTINE W REFLEX MICROSCOPIC - Abnormal; Notable for the following components:  ? Color, Urine YELLOW (*)   ? APPearance HAZY (*)   ? All other components within normal limits  ?LIPASE,  BLOOD  ?PREGNANCY, URINE  ? ? ? ?EKG ? ? ? ? ?RADIOLOGY ? ?I discussed with the patient the risks and benefits of abdominal CT scan. The present time there is no clear indication that the patient requires CT, the patient does have an abdominal complaint but exam does not suggest acute surgical abdomen and my suspicion for intra-abdominal infection including appendicitis, cholecystitis, aaa, dissection, ischemia, perforation, pancreatitis, diverticulitis or other acute major intra-abdominal process is quite low.  Also based on prior history she seems to have cyclical episodes of similar presentation of nausea and vomiting.  After discussing the risks and benefits including benefits of additional evaluation for diagnoses, ruling out infection/perforationetc, but also discussing the risks including low,but not 0 risk of inducing cancers due to radiation the patient indicated via our shared medical decision-making that she would not do a CAT scan. Rather if the patient does have  worsening symptoms, develops a high fever, develops pain or persistent discomfort or other new concerns arise they will come back to emergency room right away. As the patient's clinician I think this is a very reasonable decision having discussed general risks and benefits of CT, and my clinical suspicion that CT would be of benefit at this time is very low. ? ? ?PROCEDURES: ? ?Critical Care performed: No ? ?Procedures ? ? ?MEDICATIONS ORDERED IN ED: ?Medications  ?ondansetron (ZOFRAN) injection 4 mg (4 mg Intravenous Given 06/13/21 1137)  ?promethazine (PHENERGAN) injection 25 mg (25 mg Intramuscular Given 06/13/21 1139)  ?sodium chloride 0.9 % bolus 1,000 mL (0 mLs Intravenous Stopped 06/13/21 1427)  ?famotidine (PEPCID) IVPB 20 mg premix (0 mg Intravenous Stopped 06/13/21 1259)  ?HYDROmorphone (DILAUDID) injection 0.5 mg (0.5 mg Intravenous Given 06/13/21 1156)  ? ? ? ?IMPRESSION / MDM / ASSESSMENT AND PLAN / ED COURSE  ?I reviewed the triage vital signs and the nursing notes. ?             ?               ? ?Differential diagnosis includes but is not limited to, abdominal perforation, aortic dissection, cholecystitis, appendicitis, diverticulitis, colitis, esophagitis/gastritis, kidney stone, pyelonephritis, urinary tract infection, aortic aneurysm. All are considered in decision and treatment plan. Based upon the patient's presentation and risk factors, and negative pregnancy status, suspect recurrent episode of acute nausea vomiting.  Discussed with the patient and she does continue to use marijuana on a regular basis, and it appears on previous ED visit she had labs and imaging that were reassuring including CT abdomen pelvis.  She reports her symptoms are very similar today.  Some suspicion for cannabinoid induced hyperemesis, but differential diagnosis does remain broad. ? ?Labs reviewed very mild leukocytosis present.  LFTs and metabolic panel are normal.  Lipase normal.  Urinalysis normal negative pregnancy  test.  Discussed with patient also with her mother-in-law who is at the bedside, and will trial antiemetics fluids hydration and reassess.  We did discuss risks and benefits of CT imaging of the abdomen pelvis, and it this time plan to reassess for symptomatic improvement as my pretest probability for acute intra-abdominal infection, perforation, or acute life-threatening pathology is low at this time. ? ? ?The patient is on the cardiac monitor to evaluate for evidence of arrhythmia and/or significant heart rate changes. ? ?Clinical Course as of 06/13/21 1539  ?Thu Jun 13, 2021  ?1322 Reevaluation, patient resting at this time.  Reports her nausea is much improved.  She reports her stomach just feels  slightly sore but no pain.  Denies abdominal pain.  On exam abdomen soft nontender through all quadrants.  She reports that just feels a little bit sore from vomiting, but there is no focality and there is no evidence of acute intra-abdominal pain at this point.  We will trial p.o. challenge if she does well anticipate likely discharge to home with careful return precautions. [MQ]  ?1323 Suspect self-limited [MQ]  ?1323 Suspect self-limited etiology possibly related to cannabinol use but at this point cannot confirm that [MQ]  ?1446 Patient reports no pain.  Nausea has resolved.  She feels improved.  Feels comfortable with plan to discharge, will prescribe additional Zofran as she reports she believes she may be almost out at home, and also prescription for Phenergan for which we discussed potential side effects and sedation from that can occur with this medication.  Patient agreeable not to drive today or while using Phenergan. [MQ]  ?1446 Patient also tells me that it seems that she has the symptoms occur about every couple of months.  I am highly suspicious this may be some type of cyclical vomiting.  Discussed with her and recommended follow-up with GI.  We also discussed very careful abdominal pain and return  precautions which she and mother-in-law are agreeable with [MQ]  ?  ?Clinical Course User Index ?[MQ] Sharyn Creamer, MD  ? ?Abdomen soft nontender patient reports no pain or discomfort to palpation in all quadrants af

## 2021-06-13 NOTE — ED Triage Notes (Signed)
Pt comes into the ED via POV c/o generalized abd pain, N/V/D.  Pt states this started this morning.  Pt comes to triage with her head being held in a trash can.  Pt has even and unlabored respirations at this time.  ?

## 2021-06-14 ENCOUNTER — Telehealth: Payer: Self-pay

## 2021-06-14 NOTE — Telephone Encounter (Signed)
Transition Care Management Follow-up Telephone Call ?Date of discharge and from where: 06/13/2021-[facility] ?How have you been since you were released from the hospital? Pt stated she is doing ok.  ?Any questions or concerns? No ? ?Items Reviewed: ?Did the pt receive and understand the discharge instructions provided? Yes  ?Medications obtained and verified? Yes  ?Other? No  ?Any new allergies since your discharge? No  ?Dietary orders reviewed? No ?Do you have support at home? Yes  ? ?Home Care and Equipment/Supplies: ?Were home health services ordered? not applicable ?If so, what is the name of the agency? N/A  ?Has the agency set up a time to come to the patient's home? not applicable ?Were any new equipment or medical supplies ordered?  No ?What is the name of the medical supply agency? N/A ?Were you able to get the supplies/equipment? not applicable ?Do you have any questions related to the use of the equipment or supplies? No ? ?Functional Questionnaire: (I = Independent and D = Dependent) ?ADLs: I ? ?Bathing/Dressing- I ? ?Meal Prep- I ? ?Eating- I ? ?Maintaining continence- I ? ?Transferring/Ambulation- I ? ?Managing Meds- I ? ?Follow up appointments reviewed: ? ?PCP Hospital f/u appt confirmed? No   ?Specialist Hospital f/u appt confirmed? No   ?Are transportation arrangements needed? No  ?If their condition worsens, is the pt aware to call PCP or go to the Emergency Dept.? Yes ?Was the patient provided with contact information for the PCP's office or ED? Yes ?Was to pt encouraged to call back with questions or concerns? Yes  ?

## 2021-07-03 ENCOUNTER — Ambulatory Visit: Payer: Medicaid Other | Admitting: Family Medicine

## 2021-07-03 ENCOUNTER — Encounter: Payer: Self-pay | Admitting: Family Medicine

## 2021-07-03 VITALS — BP 123/79 | HR 76 | Wt 189.0 lb

## 2021-07-03 DIAGNOSIS — N939 Abnormal uterine and vaginal bleeding, unspecified: Secondary | ICD-10-CM | POA: Diagnosis not present

## 2021-07-03 DIAGNOSIS — R87810 Cervical high risk human papillomavirus (HPV) DNA test positive: Secondary | ICD-10-CM

## 2021-07-03 LAB — CBC
Hematocrit: 36.9 % (ref 34.0–46.6)
Hemoglobin: 12.6 g/dL (ref 11.1–15.9)
MCH: 31.8 pg (ref 26.6–33.0)
MCHC: 34.1 g/dL (ref 31.5–35.7)
MCV: 93 fL (ref 79–97)
Platelets: 228 10*3/uL (ref 150–450)
RBC: 3.96 x10E6/uL (ref 3.77–5.28)
RDW: 12.3 % (ref 11.7–15.4)
WBC: 5.4 10*3/uL (ref 3.4–10.8)

## 2021-07-03 MED ORDER — TRANEXAMIC ACID 650 MG PO TABS: 1300.0000 mg | ORAL_TABLET | Freq: Three times a day (TID) | ORAL | 3 refills | Status: DC

## 2021-07-03 MED ORDER — DOXYCYCLINE HYCLATE 100 MG PO CAPS: 100.0000 mg | ORAL_CAPSULE | Freq: Two times a day (BID) | ORAL | 0 refills | Status: DC

## 2021-07-03 NOTE — Progress Notes (Signed)
RGYN patient presents for problem visit today.  ? ?LMP: 06/01/21-06/07/21 then again 06/29/21-current and still heavy passing clots   ? ?CC: pt states cycles have been heavy since BTL 2 yrs ago . ?Notes Clots, soaking heavy pad in 30 min-1 hr, pt states today she soaked a pad in 5 mins due to large clot.  ?Periods are also painful  ?Notes cyst on right ovary had CT done on 04/21/21  ? ?

## 2021-07-03 NOTE — Assessment & Plan Note (Signed)
Needs pap, has not had a repeat since last with + HPV 16 in 2018, colpo showed CIN1 and none since. ?

## 2021-07-03 NOTE — Assessment & Plan Note (Signed)
Since BTL--needs u/s and CBC and TSH--trial of doxy and lysteda. Might need EMB and pap and check pelvic u/s. ?Briefly touched on options. She is not a good candidate for OCPs related to tobacco use. ?Might be able to do POP or Aygestin if needed or IUD (states she does not want to try this), could have ablation. Risks of hysterectomy reviewed briefly--especially with h/o c-section x 3. ?

## 2021-07-03 NOTE — Progress Notes (Signed)
? ? ?  Subjective:  ? ? Patient ID: Holly Gordon is a 41 y.o. female presenting with Menorrhagia ? on 07/03/2021 ? ?HPI: ?S/p SVD x 1 and C-section x 3 with BTL. Cycles have been heavy since that time and getting worse. Cycles are regular. Lasts 7 days. Bleeding a lot with clots. Cycles are painful. ? ?Review of Systems  ?Constitutional:  Negative for chills and fever.  ?Respiratory:  Negative for shortness of breath.   ?Cardiovascular:  Negative for chest pain.  ?Gastrointestinal:  Negative for abdominal pain, nausea and vomiting.  ?Genitourinary:  Positive for vaginal bleeding. Negative for dysuria.  ?Skin:  Negative for rash.  ?   ?Objective:  ?  ?BP 123/79   Pulse 76   Wt 189 lb (85.7 kg)   LMP 06/29/2021 (Exact Date)   BMI 32.44 kg/m?  ?Physical Exam ?Exam conducted with a chaperone present.  ?Constitutional:   ?   General: She is not in acute distress. ?   Appearance: She is well-developed.  ?HENT:  ?   Head: Normocephalic and atraumatic.  ?Eyes:  ?   General: No scleral icterus. ?Cardiovascular:  ?   Rate and Rhythm: Normal rate.  ?Pulmonary:  ?   Effort: Pulmonary effort is normal.  ?Abdominal:  ?   Palpations: Abdomen is soft.  ?Musculoskeletal:  ?   Cervical back: Neck supple.  ?Skin: ?   General: Skin is warm and dry.  ?Neurological:  ?   Mental Status: She is alert and oriented to person, place, and time.  ? ? ? ?   ?Assessment & Plan:  ? ?Problem List Items Addressed This Visit   ? ?  ? Unprioritized  ? 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  Needs pap, has not had a repeat since last with + HPV 16 in 2018, colpo showed CIN1 and none since. ? ?  ?  ? Abnormal uterine bleeding - Primary  ?  Since BTL--needs u/s and CBC and TSH--trial of doxy and lysteda. Might need EMB and pap and check pelvic u/s. ?Briefly touched on options. She is not a good candidate for OCPs related to tobacco use. ?Might be able to do POP or Aygestin if needed or IUD  (states she does not want to try this), could have ablation. Risks of hysterectomy reviewed briefly--especially with h/o c-section x 3. ? ?  ?  ? Relevant Medications  ? doxycycline (VIBRAMYCIN) 100 MG capsule  ? tranexamic acid (LYSTEDA) 650 MG TABS tablet  ? Other Relevant Orders  ? US PELVIC COMPLETE WITH TRANSVAGINAL  ? CBC  ? TSH  ? ? ?Return in about 2 weeks (around 07/17/2021) for pap and EMB. ? ?Reva Bores, MD ?07/03/2021 ?4:33 PM ? ? ?

## 2021-07-04 DIAGNOSIS — N939 Abnormal uterine and vaginal bleeding, unspecified: Secondary | ICD-10-CM | POA: Diagnosis not present

## 2021-07-05 ENCOUNTER — Encounter: Payer: Self-pay | Admitting: Family Medicine

## 2021-07-05 LAB — TSH: TSH: 1.47 u[IU]/mL (ref 0.450–4.500)

## 2021-07-15 ENCOUNTER — Ambulatory Visit
Admission: RE | Admit: 2021-07-15 | Discharge: 2021-07-15 | Disposition: A | Discharge status: Home / Self Care | Payer: Medicaid Other | Source: Ambulatory Visit | Attending: Family Medicine | Admitting: Family Medicine

## 2021-07-15 DIAGNOSIS — R188 Other ascites: Secondary | ICD-10-CM | POA: Diagnosis not present

## 2021-07-15 DIAGNOSIS — N939 Abnormal uterine and vaginal bleeding, unspecified: Secondary | ICD-10-CM | POA: Diagnosis not present

## 2021-07-17 ENCOUNTER — Ambulatory Visit: Payer: Medicaid Other | Admitting: Family Medicine

## 2021-07-17 ENCOUNTER — Encounter: Payer: Self-pay | Admitting: Family Medicine

## 2021-07-17 ENCOUNTER — Other Ambulatory Visit (HOSPITAL_COMMUNITY)
Admission: RE | Admit: 2021-07-17 | Discharge: 2021-07-17 | Disposition: A | Discharge status: Home / Self Care | Payer: Medicaid Other | Source: Ambulatory Visit | Attending: Family Medicine | Admitting: Family Medicine

## 2021-07-17 VITALS — BP 140/82 | HR 98 | Ht 64.0 in | Wt 184.0 lb

## 2021-07-17 DIAGNOSIS — R87810 Cervical high risk human papillomavirus (HPV) DNA test positive: Secondary | ICD-10-CM | POA: Diagnosis not present

## 2021-07-17 DIAGNOSIS — N939 Abnormal uterine and vaginal bleeding, unspecified: Secondary | ICD-10-CM | POA: Diagnosis not present

## 2021-07-17 DIAGNOSIS — Z01419 Encounter for gynecological examination (general) (routine) without abnormal findings: Secondary | ICD-10-CM | POA: Diagnosis not present

## 2021-07-17 NOTE — Assessment & Plan Note (Signed)
Repeat pap needed ?

## 2021-07-17 NOTE — Progress Notes (Signed)
? ?  Subjective:  ? ? Patient ID: Holly Gordon is a 41 y.o. female presenting with Gynecologic Exam ? on 07/17/2021 ? ?HPI: ?Here for pap smear and re-eval of bleeding. Has a lot of bleeding since BTL. Has h/o c-section x 3. Has h/o HPV with + HPV16 and colpo showing CIN1 in 2018 and no f/u since.  ?Pelvic sonogram shows endometrial thickness of 11 mm and small ovoid fluid collection and they recommended UPT but suspect it is non-specific endometrial fluid. Has painful and heavy cycles. ? ?Review of Systems  ?Constitutional:  Negative for chills and fever.  ?Respiratory:  Negative for shortness of breath.   ?Cardiovascular:  Negative for chest pain.  ?Gastrointestinal:  Negative for abdominal pain, nausea and vomiting.  ?Genitourinary:  Positive for vaginal bleeding. Negative for dysuria.  ?Skin:  Negative for rash.  ?   ?Objective:  ?  ?BP 140/82   Pulse 98   Ht 5\' 4"  (1.626 m)   Wt 184 lb (83.5 kg)   LMP 06/29/2021 (Exact Date)   Breastfeeding No   BMI 31.58 kg/m?  ?Physical Exam ?Exam conducted with a chaperone present.  ?Constitutional:   ?   General: She is not in acute distress. ?   Appearance: She is well-developed.  ?HENT:  ?   Head: Normocephalic and atraumatic.  ?Eyes:  ?   General: No scleral icterus. ?Cardiovascular:  ?   Rate and Rhythm: Normal rate.  ?Pulmonary:  ?   Effort: Pulmonary effort is normal.  ?Chest:  ?Breasts: ?   Right: No inverted nipple or mass.  ?   Left: Normal. No inverted nipple or mass.  ?Abdominal:  ?   Palpations: Abdomen is soft.  ?Genitourinary: ?   Comments: BUS normal, vagina is pink and rugated, cervix is parous and friable, uterus is small and anteverted, no adnexal mass or tenderness. ? ?Musculoskeletal:  ?   Cervical back: Neck supple.  ?Skin: ?   General: Skin is warm and dry.  ?Neurological:  ?   Mental Status: She is alert and oriented to person, place, and time.  ? ? ? ?   ?Assessment & Plan:  ? ?Problem List Items Addressed This Visit   ? ?  ?  Unprioritized  ? 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  Repeat pap needed ? ?  ?  ? Relevant Orders  ? Cytology - PAP  ? Abnormal uterine bleeding  ?  Has Lysteda to use if needed--do not think she needs an EMB given appearance on u/s thickness is 11 mm. ? ?  ?  ? ?Other Visit Diagnoses   ? ? Encounter for well woman exam with routine gynecological exam    -  Primary  ? Normal breast exam  ? ?  ? ? ?Return in about 4 weeks (around 08/14/2021). ? ?Donnamae Jude, MD ?07/17/2021 ?9:14 AM ? ? ?

## 2021-07-17 NOTE — Assessment & Plan Note (Signed)
Has Lysteda to use if needed--do not think she needs an EMB given appearance on u/s thickness is 11 mm. ?

## 2021-07-20 DIAGNOSIS — Z79899 Other long term (current) drug therapy: Secondary | ICD-10-CM | POA: Diagnosis not present

## 2021-07-20 LAB — CYTOLOGY - PAP
Comment: NEGATIVE
Comment: NEGATIVE
Comment: NEGATIVE
Diagnosis: HIGH — AB
HPV 16: POSITIVE — AB
HPV 18 / 45: NEGATIVE
High risk HPV: POSITIVE — AB

## 2021-07-22 ENCOUNTER — Telehealth: Payer: Self-pay | Admitting: *Deleted

## 2021-07-22 NOTE — Telephone Encounter (Signed)
-----   Message from Tanya S Pratt, MD sent at 07/22/2021  8:06 AM EDT ----- Needs colpo 

## 2021-07-22 NOTE — Telephone Encounter (Signed)
-----   Message from Reva Bores, MD sent at 07/22/2021  8:06 AM EDT ----- Needs colpo

## 2021-07-22 NOTE — Telephone Encounter (Signed)
Pt informed of pap results and colpo scheduled for 7/24 at 1:50pm

## 2021-07-22 NOTE — Telephone Encounter (Signed)
Left message for pt to call back regarding pap results

## 2021-08-13 ENCOUNTER — Encounter: Payer: Medicaid Other | Admitting: Obstetrics and Gynecology

## 2021-09-05 ENCOUNTER — Other Ambulatory Visit: Payer: Self-pay | Admitting: Family Medicine

## 2021-09-05 DIAGNOSIS — N939 Abnormal uterine and vaginal bleeding, unspecified: Secondary | ICD-10-CM

## 2021-09-23 ENCOUNTER — Encounter: Payer: Medicaid Other | Admitting: Family Medicine

## 2021-10-09 ENCOUNTER — Ambulatory Visit (INDEPENDENT_AMBULATORY_CARE_PROVIDER_SITE_OTHER): Payer: Medicaid Other | Admitting: Family Medicine

## 2021-10-09 ENCOUNTER — Other Ambulatory Visit (HOSPITAL_COMMUNITY)
Admission: RE | Admit: 2021-10-09 | Discharge: 2021-10-09 | Disposition: A | Discharge status: Home / Self Care | Payer: Medicaid Other | Source: Ambulatory Visit | Attending: Family Medicine | Admitting: Family Medicine

## 2021-10-09 ENCOUNTER — Encounter: Payer: Self-pay | Admitting: Family Medicine

## 2021-10-09 VITALS — BP 114/78 | HR 82 | Wt 184.0 lb

## 2021-10-09 DIAGNOSIS — D069 Carcinoma in situ of cervix, unspecified: Secondary | ICD-10-CM | POA: Insufficient documentation

## 2021-10-09 DIAGNOSIS — R87613 High grade squamous intraepithelial lesion on cytologic smear of cervix (HGSIL): Secondary | ICD-10-CM

## 2021-10-09 DIAGNOSIS — D060 Carcinoma in situ of endocervix: Secondary | ICD-10-CM | POA: Diagnosis not present

## 2021-10-09 NOTE — Progress Notes (Signed)
    GYNECOLOGY OFFICE COLPOSCOPY PROCEDURE NOTE  41 y.o. [id number] here for colposcopy for high-grade squamous intraepithelial neoplasia  (HGSIL-encompassing moderate and severe dysplasia) pap smear on 07/17/2021. Discussed role for HPV in cervical dysplasia, need for surveillance.  Patient gave informed written consent, time out was performed.  Placed in lithotomy position. Cervix viewed with speculum and colposcope after application of acetic acid.   Colposcopy adequate? Yes  acetowhite lesion(s) noted at posterior SCJ 4 quadrants sampled.  ECC specimen obtained. All specimens were labeled and sent to pathology.  Chaperone was present during entire procedure.  Patient was given post procedure instructions.  Will follow up pathology and manage accordingly; patient will be contacted with results and recommendations.  Routine preventative health maintenance measures emphasized.  History In addition to her colposcopy, patient reports RLQ pain that is worse with movement and cyclical. Has f/h endometriosis. Reports heavy cycles. Has h/o C-section x 3. Noted to have ovarian cyst 2/23 on CT. Pelvic sonogram is negative for cyst on 5/23.  Physical exam Gen - NAD Chest - normal effort Abd - soft, NT GU - BUS normal, vagina is pink and rugated, cervix is parous without lesion  Assessment Pelvic pain Abnormal bleeding ? Related to adenomyosis  Plan Discussed possible endometrial ablation or other. She may desires hysterectomy will need to await cervical biopsies and given previous surgeries, risks will need to be reviewed at length.   Reva Bores, MD 10/09/2021 2:49 PM

## 2021-10-09 NOTE — Progress Notes (Signed)
Patient presents for Colpo Abnormal pap 07/17/21 HSIL + HPV 16  LMP:09/20/21.  Pt has had BTL   CC: twisting sensation below belly button since. Will have nausea and vomiting then have to go to ER. Pt states it is painful.

## 2021-10-14 LAB — SURGICAL PATHOLOGY

## 2021-10-15 DIAGNOSIS — F419 Anxiety disorder, unspecified: Secondary | ICD-10-CM | POA: Diagnosis not present

## 2021-10-15 DIAGNOSIS — J310 Chronic rhinitis: Secondary | ICD-10-CM | POA: Diagnosis not present

## 2021-10-15 DIAGNOSIS — J302 Other seasonal allergic rhinitis: Secondary | ICD-10-CM | POA: Diagnosis not present

## 2021-10-15 DIAGNOSIS — M62838 Other muscle spasm: Secondary | ICD-10-CM | POA: Diagnosis not present

## 2021-10-15 DIAGNOSIS — M545 Low back pain, unspecified: Secondary | ICD-10-CM | POA: Diagnosis not present

## 2021-10-15 DIAGNOSIS — G2581 Restless legs syndrome: Secondary | ICD-10-CM | POA: Diagnosis not present

## 2021-10-15 DIAGNOSIS — Z79899 Other long term (current) drug therapy: Secondary | ICD-10-CM | POA: Diagnosis not present

## 2021-11-14 ENCOUNTER — Other Ambulatory Visit: Payer: Self-pay | Admitting: *Deleted

## 2021-11-14 DIAGNOSIS — N939 Abnormal uterine and vaginal bleeding, unspecified: Secondary | ICD-10-CM

## 2021-11-14 DIAGNOSIS — J3081 Allergic rhinitis due to animal (cat) (dog) hair and dander: Secondary | ICD-10-CM | POA: Diagnosis not present

## 2021-11-14 MED ORDER — TRANEXAMIC ACID 650 MG PO TABS: 1300.0000 mg | ORAL_TABLET | Freq: Three times a day (TID) | ORAL | 2 refills | Status: AC

## 2021-11-25 DIAGNOSIS — G2581 Restless legs syndrome: Secondary | ICD-10-CM | POA: Diagnosis not present

## 2021-11-25 DIAGNOSIS — Z23 Encounter for immunization: Secondary | ICD-10-CM | POA: Diagnosis not present

## 2021-11-25 DIAGNOSIS — N911 Secondary amenorrhea: Secondary | ICD-10-CM | POA: Diagnosis not present

## 2021-11-25 DIAGNOSIS — M62838 Other muscle spasm: Secondary | ICD-10-CM | POA: Diagnosis not present

## 2021-11-25 DIAGNOSIS — F3160 Bipolar disorder, current episode mixed, unspecified: Secondary | ICD-10-CM | POA: Diagnosis not present

## 2021-11-25 DIAGNOSIS — Z6831 Body mass index (BMI) 31.0-31.9, adult: Secondary | ICD-10-CM | POA: Diagnosis not present

## 2021-11-25 DIAGNOSIS — Z79899 Other long term (current) drug therapy: Secondary | ICD-10-CM | POA: Diagnosis not present

## 2021-11-25 DIAGNOSIS — F411 Generalized anxiety disorder: Secondary | ICD-10-CM | POA: Diagnosis not present

## 2021-11-25 DIAGNOSIS — F410 Panic disorder [episodic paroxysmal anxiety] without agoraphobia: Secondary | ICD-10-CM | POA: Diagnosis not present

## 2021-11-25 DIAGNOSIS — F32A Depression, unspecified: Secondary | ICD-10-CM | POA: Diagnosis not present

## 2021-11-27 ENCOUNTER — Telehealth (INDEPENDENT_AMBULATORY_CARE_PROVIDER_SITE_OTHER): Payer: Medicaid Other | Admitting: Family Medicine

## 2021-11-27 ENCOUNTER — Encounter: Payer: Self-pay | Admitting: Family Medicine

## 2021-11-27 DIAGNOSIS — D069 Carcinoma in situ of cervix, unspecified: Secondary | ICD-10-CM | POA: Diagnosis not present

## 2021-11-27 NOTE — Progress Notes (Signed)
Virtual Visit pt would like to discuss LEEP procedure before having procedure done.  Pt has concerns.   Pt also worried about pain management.

## 2021-11-27 NOTE — Progress Notes (Signed)
    GYNECOLOGY VIRTUAL VISIT ENCOUNTER NOTE  Provider location: [facility]   Patient location: Home  I connected with Holly Gordon on 11/27/21 at 10:15 AM EDT by MyChart Video Encounter and verified that I am speaking with the correct person using two identifiers.   I discussed the limitations, risks, security and privacy concerns of performing an evaluation and management service virtually and the availability of in person appointments. I also discussed with the patient that there may be a patient responsible charge related to this service. The patient expressed understanding and agreed to proceed.   History:  Holly Gordon is a 41 y.o. [phone] female being evaluated today for preparation for LEEP procedure. She is scared. 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  She denies any abnormal vaginal discharge, pelvic pain or other concerns.       Past Medical History:  Diagnosis Date   Anemia    Anxiety    Bacterial vaginitis 08/26/2016   Depression    had counseling in the passt no meds   History of abnormal cervical Pap smear    Past Surgical History:  Procedure Laterality Date   CESAREAN SECTION  2008   x1   CESAREAN SECTION N/A 04/03/2017   Procedure: CESAREAN SECTION;  Surgeon: Chancy Milroy, MD;  Location: [facility];  Service: Obstetrics;  Laterality: N/A;   COLPOSCOPY     abnormal pap   INDUCED ABORTION     TONSILLECTOMY     WISDOM TOOTH EXTRACTION     x4   The following portions of the patient's history were reviewed and updated as appropriate: allergies, current medications, past family history, past medical history, past social history, past surgical history and problem list.    Review of Systems:  Pertinent items noted in HPI and remainder of comprehensive ROS otherwise negative.  Physical Exam:   General:  Alert, oriented and cooperative. Patient appears to be in no acute distress.  Mental Status: Normal mood  and affect. Normal behavior. Normal judgment and thought content.   Respiratory: Normal respiratory effort, no problems with respiration noted  Rest of physical exam deferred due to type of encounter  Labs and Imaging No results found for this or any previous visit (from the past 336 hour(s)). No results found.     Assessment and Plan:     1. High grade squamous intraepithelial lesion (HGSIL), grade 3 CIN, on biopsy of cervix For LEEP, this is scheduled. Talked through procedure, importance of having it done. Offered OR for LEEP given her trepidation, but she will keep her next appointment and have it done here.       I discussed the assessment and treatment plan with the patient. The patient was provided an opportunity to ask questions and all were answered. The patient agreed with the plan and demonstrated an understanding of the instructions.   The patient was advised to call back or seek an in-person evaluation/go to the ED if the symptoms worsen or if the condition fails to improve as anticipated.  I provided 7 minutes of face-to-face time during this encounter.   Donnamae Jude, MD [facility], [place]

## 2021-12-04 ENCOUNTER — Other Ambulatory Visit (HOSPITAL_COMMUNITY)
Admission: RE | Admit: 2021-12-04 | Discharge: 2021-12-04 | Disposition: A | Discharge status: Home / Self Care | Payer: Medicaid Other | Source: Ambulatory Visit | Attending: Family Medicine | Admitting: Family Medicine

## 2021-12-04 ENCOUNTER — Ambulatory Visit: Payer: Medicaid Other | Admitting: Family Medicine

## 2021-12-04 VITALS — BP 129/88 | HR 90

## 2021-12-04 DIAGNOSIS — D069 Carcinoma in situ of cervix, unspecified: Secondary | ICD-10-CM | POA: Insufficient documentation

## 2021-12-04 LAB — POCT URINE PREGNANCY: Preg Test, Ur: NEGATIVE

## 2021-12-04 NOTE — Progress Notes (Signed)
   GYNECOLOGY OFFICE PROCEDURE NOTE Holly Gordon is a 41 y.o. [id number] here for LEEP. No GYN concerns. Pap smear and colposcopy history reviewed.    Pap HGSIL Colpo Biopsy CIN 2-3 ECC positive  Risks, benefits, alternatives, and limitations of procedure explained to patient, including pain, bleeding, infection, failure to remove abnormal tissue and failure to cure dysplasia, need for repeat procedures, damage to pelvic organs, cervical incompetence.  Role of HPV,cervical dysplasia and need for close followup was empasized. Informed written consent was obtained. All questions were answered. Time out performed. Urine pregnancy test was negative.  ??Procedure: The patient was placed in lithotomy position and the bivalved coated speculum was placed in the patient's vagina. A grounding pad placed on the patient. Local anesthesia was administered via an intracervical block using 20 ml of 2% Lidocaine with epinephrine. Colposcopy performed. The suction was turned on and the Large 1X Fisher Cone Biopsy Excisor on 30 Watts of blended current was used to excise the entire transformation zone. Excellent hemostasis was achieved using roller ball coagulation set at 50 Watts coagulation current. Monsel's solution was then applied and the speculum was removed from the vagina. Specimens were sent to pathology.  ?The patient tolerated the procedure well. Post-operative instructions given to patient, including instruction to seek medical attention for persistent bright red bleeding, fever, abdominal/pelvic pain, dysuria, nausea or vomiting. She was also told about the possibility of having copious yellow to black tinged discharge for weeks. She was counseled to avoid anything in the vagina (sex/douching/tampons) for 3 weeks.   Donnamae Jude, MD 12/04/2021 10:50 AM

## 2021-12-06 LAB — SURGICAL PATHOLOGY

## 2021-12-08 ENCOUNTER — Encounter: Payer: Self-pay | Admitting: Family Medicine

## 2021-12-19 ENCOUNTER — Encounter: Payer: Self-pay | Admitting: Emergency Medicine

## 2021-12-19 ENCOUNTER — Other Ambulatory Visit: Payer: Self-pay

## 2021-12-19 ENCOUNTER — Emergency Department
Admission: EM | Admit: 2021-12-19 | Discharge: 2021-12-19 | Disposition: A | Discharge status: Home / Self Care | Payer: Medicaid Other | Attending: Emergency Medicine | Admitting: Emergency Medicine

## 2021-12-19 DIAGNOSIS — R112 Nausea with vomiting, unspecified: Secondary | ICD-10-CM | POA: Diagnosis not present

## 2021-12-19 DIAGNOSIS — D72829 Elevated white blood cell count, unspecified: Secondary | ICD-10-CM | POA: Diagnosis not present

## 2021-12-19 DIAGNOSIS — R1110 Vomiting, unspecified: Secondary | ICD-10-CM

## 2021-12-19 LAB — CBC WITH DIFFERENTIAL/PLATELET
Abs Immature Granulocytes: 0.07 10*3/uL (ref 0.00–0.07)
Basophils Absolute: 0.1 10*3/uL (ref 0.0–0.1)
Basophils Relative: 1 %
Eosinophils Absolute: 0.1 10*3/uL (ref 0.0–0.5)
Eosinophils Relative: 1 %
HCT: 44.5 % (ref 36.0–46.0)
Hemoglobin: 14.7 g/dL (ref 12.0–15.0)
Immature Granulocytes: 1 %
Lymphocytes Relative: 13 %
Lymphs Abs: 1.9 10*3/uL (ref 0.7–4.0)
MCH: 31 pg (ref 26.0–34.0)
MCHC: 33 g/dL (ref 30.0–36.0)
MCV: 93.9 fL (ref 80.0–100.0)
Monocytes Absolute: 0.6 10*3/uL (ref 0.1–1.0)
Monocytes Relative: 5 %
Neutro Abs: 11.2 10*3/uL — ABNORMAL HIGH (ref 1.7–7.7)
Neutrophils Relative %: 79 %
Platelets: 250 10*3/uL (ref 150–400)
RBC: 4.74 MIL/uL (ref 3.87–5.11)
RDW: 13.9 % (ref 11.5–15.5)
WBC: 14 10*3/uL — ABNORMAL HIGH (ref 4.0–10.5)
nRBC: 0 % (ref 0.0–0.2)

## 2021-12-19 LAB — COMPREHENSIVE METABOLIC PANEL
ALT: 19 U/L (ref 0–44)
AST: 27 U/L (ref 15–41)
Albumin: 4.6 g/dL (ref 3.5–5.0)
Alkaline Phosphatase: 105 U/L (ref 38–126)
Anion gap: 6 (ref 5–15)
BUN: 8 mg/dL (ref 6–20)
CO2: 24 mmol/L (ref 22–32)
Calcium: 8.9 mg/dL (ref 8.9–10.3)
Chloride: 108 mmol/L (ref 98–111)
Creatinine, Ser: 0.96 mg/dL (ref 0.44–1.00)
GFR, Estimated: >60 mL/min (ref >60)
Glucose, Bld: 197 mg/dL — ABNORMAL HIGH (ref 70–99)
Potassium: 3.6 mmol/L (ref 3.5–5.1)
Sodium: 138 mmol/L (ref 135–145)
Total Bilirubin: 0.5 mg/dL (ref 0.3–1.2)
Total Protein: 7.8 g/dL (ref 6.5–8.1)

## 2021-12-19 LAB — URINALYSIS, ROUTINE W REFLEX MICROSCOPIC
Bilirubin Urine: NEGATIVE
Glucose, UA: 50 mg/dL — AB
Hgb urine dipstick: NEGATIVE
Ketones, ur: NEGATIVE mg/dL
Nitrite: NEGATIVE
Protein, ur: 30 mg/dL — AB
Specific Gravity, Urine: 1.015 (ref 1.005–1.030)
pH: 9 — ABNORMAL HIGH (ref 5.0–8.0)

## 2021-12-19 LAB — URINE DRUG SCREEN, QUALITATIVE (ARMC ONLY)
Amphetamines, Ur Screen: NOT DETECTED
Barbiturates, Ur Screen: NOT DETECTED
Benzodiazepine, Ur Scrn: NOT DETECTED
Cannabinoid 50 Ng, Ur ~~LOC~~: POSITIVE — AB
Cocaine Metabolite,Ur ~~LOC~~: NOT DETECTED
MDMA (Ecstasy)Ur Screen: NOT DETECTED
Methadone Scn, Ur: NOT DETECTED
Opiate, Ur Screen: NOT DETECTED
Phencyclidine (PCP) Ur S: NOT DETECTED
Tricyclic, Ur Screen: POSITIVE — AB

## 2021-12-19 LAB — LIPASE, BLOOD: Lipase: 32 U/L (ref 11–51)

## 2021-12-19 LAB — HCG, QUANTITATIVE, PREGNANCY: hCG, Beta Chain, Quant, S: <1 m[IU]/mL (ref <5)

## 2021-12-19 MED ORDER — PROMETHAZINE HCL 25 MG PO TABS: 25.0000 mg | ORAL_TABLET | Freq: Four times a day (QID) | ORAL | 0 refills | Status: DC | PRN

## 2021-12-19 MED ORDER — KETOROLAC TROMETHAMINE 30 MG/ML IJ SOLN
30.0000 mg | Freq: Once | INTRAMUSCULAR | Status: AC
  Administered 2021-12-19: 30 mg via INTRAVENOUS
  Filled 2021-12-19: qty 1

## 2021-12-19 MED ORDER — SODIUM CHLORIDE 0.9 % IV BOLUS (SEPSIS)
2000.0000 mL | Freq: Once | INTRAVENOUS | Status: AC
  Administered 2021-12-19: 2000 mL via INTRAVENOUS

## 2021-12-19 MED ORDER — PANTOPRAZOLE SODIUM 40 MG IV SOLR
40.0000 mg | Freq: Once | INTRAVENOUS | Status: AC
  Administered 2021-12-19: 40 mg via INTRAVENOUS
  Filled 2021-12-19: qty 10

## 2021-12-19 MED ORDER — SODIUM CHLORIDE 0.9 % IV SOLN
25.0000 mg | Freq: Once | INTRAVENOUS | Status: AC
  Administered 2021-12-19: 25 mg via INTRAVENOUS
  Filled 2021-12-19: qty 1

## 2021-12-19 MED ORDER — DICYCLOMINE HCL 20 MG PO TABS: 20.0000 mg | ORAL_TABLET | Freq: Three times a day (TID) | ORAL | 0 refills | Status: DC | PRN

## 2021-12-19 MED ORDER — ONDANSETRON 4 MG PO TBDP: 4.0000 mg | ORAL_TABLET | Freq: Once | ORAL | Status: DC

## 2021-12-19 NOTE — Discharge Instructions (Addendum)
You may alternate Tylenol 1000 mg every 6 hours as needed for pain, fever and Ibuprofen 800 mg every 6-8 hours as needed for pain, fever.  Please take Ibuprofen with food.  Do not take more than 4000 mg of Tylenol (acetaminophen) in a 24 hour period.  I recommend avoiding any marijuana or CBD products as this can worsen recurrent nausea and vomiting as there is a condition called cannabinoid hyperemesis syndrome.

## 2021-12-19 NOTE — ED Provider Notes (Signed)
[facility] Provider Note    Event Date/Time   First MD Initiated Contact with Patient 12/19/21 0406     (approximate)   History   Emesis   HPI  Holly Gordon is a 41 y.o. female with history of depression and anxiety, C-section x3 who presents to the emergency department with complaints of nausea and vomiting that started yesterday.  States that she thinks she has "overworked" herself and this causes her to sweat and become dehydrated which makes her then "vomit bile".  She has had soft stools but denies diarrhea.  No fevers, dysuria, hematuria, vaginal bleeding or discharge.  This is her fifth episode of similar symptoms in the past 2 years.  She has not seen a gastroenterologist.  States she did have a LEEP procedure about a week ago.  States she took her Phenergan and Zofran at home and vomited them back up.  She is requesting Phenergan here and something for pain.   History provided by patient and mother.    Past Medical History:  Diagnosis Date   Anemia    Anxiety    Bacterial vaginitis 08/26/2016   Depression    had counseling in the passt no meds   History of abnormal cervical Pap smear     Past Surgical History:  Procedure Laterality Date   CESAREAN SECTION  2008   x1   CESAREAN SECTION N/A 04/03/2017   Procedure: CESAREAN SECTION;  Surgeon: Chancy Milroy, MD;  Location: [place];  Service: Obstetrics;  Laterality: N/A;   COLPOSCOPY     abnormal pap   INDUCED ABORTION     TONSILLECTOMY     WISDOM TOOTH EXTRACTION     x4    MEDICATIONS:  Prior to Admission medications   Medication Sig Start Date End Date Taking? Authorizing Provider  acetaminophen (TYLENOL) 500 MG tablet Take 1,000 mg by mouth every 6 (six) hours as needed for mild pain.    [provider]  busPIRone (BUSPAR) 15 MG tablet Take 15 mg by mouth 3 (three) times daily. 09/24/20   [provider]  carbamazepine (CARBATROL) 200 MG 12 hr  capsule Take 200 mg by mouth 2 (two) times daily. 03/21/21   [provider]  clonazePAM (KLONOPIN) 0.5 MG tablet Take 0.5 mg by mouth daily as needed for anxiety. 03/26/20   [provider]  dicyclomine (BENTYL) 20 MG tablet Take 1 tablet (20 mg total) by mouth 2 (two) times daily. Patient not taking: Reported on 07/03/2021 06/22/20   Eustaquio Maize, PA-C  doxycycline (VIBRAMYCIN) 100 MG capsule Take 1 capsule (100 mg total) by mouth 2 (two) times daily. Patient not taking: Reported on 07/17/2021 07/03/21   Donnamae Jude, MD  FLUoxetine (PROZAC) 40 MG capsule Take 40 mg by mouth daily. 09/17/20   [provider]  hydrOXYzine (ATARAX/VISTARIL) 25 MG tablet Take 1 tablet (25 mg total) by mouth every 6 (six) hours as needed for itching. Patient not taking: Reported on 06/22/2020 06/29/19   Caren Macadam, MD  ibuprofen (ADVIL) 200 MG tablet Take 800 mg by mouth every 6 (six) hours as needed for mild pain.    [provider]  lamoTRIgine (LAMICTAL) 200 MG tablet Take 200 mg by mouth daily. 04/06/21   [provider]  levocetirizine (XYZAL) 5 MG tablet Take 5 mg by mouth at bedtime. 03/25/21   [provider]  omeprazole (PRILOSEC) 20 MG capsule Take 20 mg by mouth daily. 03/25/21  [provider]  omeprazole (PRILOSEC) 20 MG capsule Take 1 capsule (20 mg total) by mouth daily. 04/21/21   Horton, Barbette Hair, MD  ondansetron (ZOFRAN-ODT) 4 MG disintegrating tablet Take 1 tablet (4 mg total) by mouth every 6 (six) hours as needed for nausea or vomiting. 06/13/21   Delman Kitten, MD  promethazine (PHENERGAN) 12.5 MG tablet Take 1 tablet (12.5 mg total) by mouth every 6 (six) hours as needed for nausea or vomiting. Patient not taking: Reported on 07/17/2021 06/13/21   Delman Kitten, MD  sertraline (ZOLOFT) 100 MG tablet Take 1 tablet (100 mg total) by mouth daily. In 1 week increase to 150 (1.5 pills) daily. Patient not taking: Reported on 06/22/2020  06/29/19   Caren Macadam, MD  tranexamic acid (LYSTEDA) 650 MG TABS tablet Take 2 tablets (1,300 mg total) by mouth 3 (three) times daily. 11/14/21   Donnamae Jude, MD  VENTOLIN HFA 108 [phone] Base) MCG/ACT inhaler Inhale 1-2 puffs into the lungs 4 (four) times daily as needed for wheezing or shortness of breath. 03/11/21   [provider]  Vitamin D, Ergocalciferol, (DRISDOL) 1.25 MG (50000 UNIT) CAPS capsule Take 50,000 Units by mouth every Monday. 03/25/21   [provider]    Physical Exam   Triage Vital Signs: ED Triage Vitals  Enc Vitals Group     BP 12/19/21 0255 (!) 146/87     Pulse Rate 12/19/21 0255 75     Resp 12/19/21 0255 18     Temp 12/19/21 0255 97.8 F (36.6 C)     Temp Source 12/19/21 0255 Oral     SpO2 12/19/21 0255 98 %     Weight 12/19/21 0254 180 lb (81.6 kg)     Height 12/19/21 0254 5\' 4"  (1.626 m)     Head Circumference --      Peak Flow --      Pain Score 12/19/21 0254 5     Pain Loc --      Pain Edu? --      Excl. in [place]? --     Most recent vital signs: Vitals:   12/19/21 0255 12/19/21 0414  BP: (!) 146/87 (!) 163/109  Pulse: 75 62  Resp: 18 15  Temp: 97.8 F (36.6 C) 97.6 F (36.4 C)  SpO2: 98% 100%    CONSTITUTIONAL: Alert and oriented and responds appropriately to questions.  Chronically ill-appearing and appears older than stated age, actively dry heaving but no production of emesis HEAD: Normocephalic, atraumatic EYES: Conjunctivae clear, pupils appear equal, sclera nonicteric ENT: normal nose; moist mucous membranes NECK: Supple, normal ROM CARD: RRR; S1 and S2 appreciated; no murmurs, no clicks, no rubs, no gallops RESP: Normal chest excursion without splinting or tachypnea; breath sounds clear and equal bilaterally; no wheezes, no rhonchi, no rales, no hypoxia or respiratory distress, speaking full sentences ABD/GI: Normal bowel sounds; non-distended; soft, non-tender, no rebound, no guarding, no peritoneal  signs BACK: The back appears normal EXT: Normal ROM in all joints; no deformity noted, no edema; no cyanosis SKIN: Normal color for age and race; warm; no rash on exposed skin NEURO: Moves all extremities equally, normal speech PSYCH: The patient's mood and manner are appropriate.   ED Results / Procedures / Treatments   LABS: (all labs ordered are listed, but only abnormal results are displayed) Labs Reviewed  CBC WITH DIFFERENTIAL/PLATELET - Abnormal; Notable for the following components:      Result Value   WBC 14.0 (*)  Neutro Abs 11.2 (*)    All other components within normal limits  COMPREHENSIVE METABOLIC PANEL - Abnormal; Notable for the following components:   Glucose, Bld 197 (*)    All other components within normal limits  URINALYSIS, ROUTINE W REFLEX MICROSCOPIC - Abnormal; Notable for the following components:   Color, Urine YELLOW (*)    APPearance HAZY (*)    pH 9.0 (*)    Glucose, UA 50 (*)    Protein, ur 30 (*)    Leukocytes,Ua TRACE (*)    Bacteria, UA RARE (*)    All other components within normal limits  URINE DRUG SCREEN, QUALITATIVE ([facility] ONLY) - Abnormal; Notable for the following components:   Tricyclic, Ur Screen POSITIVE (*)    Cannabinoid 50 Ng, Ur [place] POSITIVE (*)    All other components within normal limits  LIPASE, BLOOD  HCG, QUANTITATIVE, PREGNANCY     EKG:   RADIOLOGY: My personal review and interpretation of imaging:    I have personally reviewed all radiology reports.   No results found.   PROCEDURES:  Critical Care performed: No     Procedures    IMPRESSION / MDM / ASSESSMENT AND PLAN / ED COURSE  I reviewed the triage vital signs and the nursing notes.    Here with recurrent nausea and vomiting.  Abdominal exam benign.     DIFFERENTIAL DIAGNOSIS (includes but not limited to):   Recurrent vomiting, viral gastroenteritis, gastritis, gastroparesis, cannabinoid hyperemesis syndrome, pregnancy, dehydration,  electrolyte derangement, doubt appendicitis, colitis, diverticulitis, bowel obstruction   Patient's presentation is most consistent with acute presentation with potential threat to life or bodily function.   PLAN: We will obtain CBC, CMP, lipase, urinalysis, urine drug screen, hCG.  Will give IV fluids, Toradol and Phenergan.  At this time given benign abdominal exam I do not feel she needs CT imaging.  CT scan in February 2023 showed esophagitis.   MEDICATIONS GIVEN IN ED: Medications  ketorolac (TORADOL) 30 MG/ML injection 30 mg (30 mg Intravenous Given 12/19/21 0540)  promethazine (PHENERGAN) 25 mg in sodium chloride 0.9 % 50 mL IVPB (25 mg Intravenous New Bag/Given 12/19/21 0543)  sodium chloride 0.9 % bolus 2,000 mL (2,000 mLs Intravenous New Bag/Given 12/19/21 0541)  pantoprazole (PROTONIX) injection 40 mg (40 mg Intravenous Given 12/19/21 0540)     ED COURSE: Patient's labs show leukocytosis of 14,000 with left shift.  No fever here.  May be reactive in nature.  Urine shows no ketonuria no sign of infection.  Pregnancy test is negative.  Normal electrolytes, renal function, LFTs and lipase.  Urine drug screen is positive for cannabinoids which could be the cause of her recurrent vomiting.  Have recommended she stop using marijuana and other marijuana/CBD products.  She states she is feeling better after Phenergan.  Will p.o. challenge but anticipate discharge home.  Will give refills for Phenergan as well as prescription for Bentyl.  Given outpatient GI follow-up if symptoms do not improve with cessation of marijuana.  At this time, I do not feel there is any life-threatening condition present. I reviewed all nursing notes, vitals, pertinent previous records.  All lab and urine results, EKGs, imaging ordered have been independently reviewed and interpreted by myself.  I reviewed all available radiology reports from any imaging ordered this visit.  Based on my assessment, I feel the patient  is safe to be discharged home without further emergent workup and can continue workup as an outpatient as needed. Discussed all  findings, treatment plan as well as usual and customary return precautions.  They verbalize understanding and are comfortable with this plan.  Outpatient follow-up has been provided as needed.  All questions have been answered.    CONSULTS:  none   OUTSIDE RECORDS REVIEWED: Reviewed patient's last OB admission in October 2020.       FINAL CLINICAL IMPRESSION(S) / ED DIAGNOSES   Final diagnoses:  Uncontrollable vomiting     Rx / DC Orders   ED Discharge Orders          Ordered    promethazine (PHENERGAN) 25 MG tablet  Every 6 hours PRN        12/19/21 0626    dicyclomine (BENTYL) 20 MG tablet  Every 8 hours PRN        12/19/21 [id number]             Note:  This document was prepared using Dragon voice recognition software and may include unintentional dictation errors.   Mikkel Charrette, Delice Bison, DO 12/19/21 [phone]

## 2021-12-19 NOTE — ED Notes (Signed)
Pt states that her nausea is better and she is ready to go home. Edp notified.

## 2021-12-19 NOTE — ED Triage Notes (Signed)
Pt to triage via w/c with no distress noted; reports N/V and generalized abd pain for "couple hours"

## 2022-01-04 ENCOUNTER — Other Ambulatory Visit: Payer: Self-pay

## 2022-01-04 ENCOUNTER — Emergency Department (HOSPITAL_COMMUNITY)
Admission: EM | Admit: 2022-01-04 | Discharge: 2022-01-04 | Disposition: A | Discharge status: Home / Self Care | Payer: Medicaid Other | Attending: Emergency Medicine | Admitting: Emergency Medicine

## 2022-01-04 ENCOUNTER — Encounter (HOSPITAL_COMMUNITY): Payer: Self-pay

## 2022-01-04 ENCOUNTER — Emergency Department (HOSPITAL_COMMUNITY): Payer: Medicaid Other

## 2022-01-04 DIAGNOSIS — D72829 Elevated white blood cell count, unspecified: Secondary | ICD-10-CM | POA: Diagnosis not present

## 2022-01-04 DIAGNOSIS — M79601 Pain in right arm: Secondary | ICD-10-CM | POA: Diagnosis not present

## 2022-01-04 DIAGNOSIS — R079 Chest pain, unspecified: Secondary | ICD-10-CM | POA: Diagnosis not present

## 2022-01-04 DIAGNOSIS — F17210 Nicotine dependence, cigarettes, uncomplicated: Secondary | ICD-10-CM | POA: Insufficient documentation

## 2022-01-04 DIAGNOSIS — R1030 Lower abdominal pain, unspecified: Secondary | ICD-10-CM | POA: Diagnosis not present

## 2022-01-04 DIAGNOSIS — Z79899 Other long term (current) drug therapy: Secondary | ICD-10-CM | POA: Diagnosis not present

## 2022-01-04 DIAGNOSIS — R739 Hyperglycemia, unspecified: Secondary | ICD-10-CM | POA: Insufficient documentation

## 2022-01-04 DIAGNOSIS — R197 Diarrhea, unspecified: Secondary | ICD-10-CM | POA: Diagnosis not present

## 2022-01-04 DIAGNOSIS — Z20822 Contact with and (suspected) exposure to covid-19: Secondary | ICD-10-CM | POA: Insufficient documentation

## 2022-01-04 DIAGNOSIS — E039 Hypothyroidism, unspecified: Secondary | ICD-10-CM | POA: Diagnosis not present

## 2022-01-04 DIAGNOSIS — R112 Nausea with vomiting, unspecified: Secondary | ICD-10-CM | POA: Insufficient documentation

## 2022-01-04 LAB — COMPREHENSIVE METABOLIC PANEL
ALT: 19 U/L (ref 0–44)
AST: 22 U/L (ref 15–41)
Albumin: 4.3 g/dL (ref 3.5–5.0)
Alkaline Phosphatase: 82 U/L (ref 38–126)
Anion gap: 14 (ref 5–15)
BUN: 10 mg/dL (ref 6–20)
CO2: 21 mmol/L — ABNORMAL LOW (ref 22–32)
Calcium: 9.5 mg/dL (ref 8.9–10.3)
Chloride: 104 mmol/L (ref 98–111)
Creatinine, Ser: 0.82 mg/dL (ref 0.44–1.00)
GFR, Estimated: >60 mL/min (ref >60)
Glucose, Bld: 216 mg/dL — ABNORMAL HIGH (ref 70–99)
Potassium: 3.8 mmol/L (ref 3.5–5.1)
Sodium: 139 mmol/L (ref 135–145)
Total Bilirubin: 0.8 mg/dL (ref 0.3–1.2)
Total Protein: 7.2 g/dL (ref 6.5–8.1)

## 2022-01-04 LAB — I-STAT BETA HCG BLOOD, ED (MC, WL, AP ONLY): I-stat hCG, quantitative: 33.2 m[IU]/mL — ABNORMAL HIGH (ref <5)

## 2022-01-04 LAB — CBC
HCT: 44.2 % (ref 36.0–46.0)
Hemoglobin: 15 g/dL (ref 12.0–15.0)
MCH: 31.7 pg (ref 26.0–34.0)
MCHC: 33.9 g/dL (ref 30.0–36.0)
MCV: 93.4 fL (ref 80.0–100.0)
Platelets: 218 10*3/uL (ref 150–400)
RBC: 4.73 MIL/uL (ref 3.87–5.11)
RDW: 13.4 % (ref 11.5–15.5)
WBC: 14.5 10*3/uL — ABNORMAL HIGH (ref 4.0–10.5)
nRBC: 0 % (ref 0.0–0.2)

## 2022-01-04 LAB — URINALYSIS, ROUTINE W REFLEX MICROSCOPIC
Bacteria, UA: NONE SEEN
Bilirubin Urine: NEGATIVE
Glucose, UA: 150 mg/dL — AB
Hgb urine dipstick: NEGATIVE
Ketones, ur: 20 mg/dL — AB
Leukocytes,Ua: NEGATIVE
Nitrite: NEGATIVE
Protein, ur: 30 mg/dL — AB
Specific Gravity, Urine: 1.018 (ref 1.005–1.030)
pH: 8 (ref 5.0–8.0)

## 2022-01-04 LAB — RESP PANEL BY RT-PCR (FLU A&B, COVID) ARPGX2
Influenza A by PCR: NEGATIVE
Influenza B by PCR: NEGATIVE
SARS Coronavirus 2 by RT PCR: NEGATIVE

## 2022-01-04 LAB — HCG, QUANTITATIVE, PREGNANCY: hCG, Beta Chain, Quant, S: <1 m[IU]/mL (ref <5)

## 2022-01-04 LAB — LIPASE, BLOOD: Lipase: 27 U/L (ref 11–51)

## 2022-01-04 MED ORDER — ONDANSETRON HCL 4 MG/2ML IJ SOLN
4.0000 mg | Freq: Once | INTRAMUSCULAR | Status: AC
  Administered 2022-01-04: 4 mg via INTRAVENOUS
  Filled 2022-01-04: qty 2

## 2022-01-04 MED ORDER — KETOROLAC TROMETHAMINE 15 MG/ML IJ SOLN
15.0000 mg | Freq: Once | INTRAMUSCULAR | Status: AC
  Administered 2022-01-04: 15 mg via INTRAVENOUS
  Filled 2022-01-04: qty 1

## 2022-01-04 MED ORDER — PROMETHAZINE HCL 25 MG RE SUPP: 25.0000 mg | Freq: Four times a day (QID) | RECTAL | 0 refills | Status: DC | PRN

## 2022-01-04 MED ORDER — PROMETHAZINE HCL 25 MG/ML IJ SOLN
12.5000 mg | Freq: Once | INTRAMUSCULAR | Status: AC
  Administered 2022-01-04: 12.5 mg via INTRAMUSCULAR
  Filled 2022-01-04: qty 1

## 2022-01-04 NOTE — ED Provider Triage Note (Signed)
Emergency Medicine Provider Triage Evaluation Note  Holly Gordon , a 41 y.o. female  was evaluated in triage.  Pt complains of nausea, vomiting, body aches since last night.  She reports her daughter recently tested positive for the flu, she reports she received her flu shot this year but still having significant nausea and vomiting.  She also endorses some diarrhea.  She reports that she does not respond to oral Zofran and is requesting IM Phenergan. she denies.  Any dysuria, vaginal bleeding, vaginal discharge.  Review of Systems  Positive: Nausea, vomiting, diarrhea, abdominal pain Negative: Dysuria, hematuria, vaginal discharge  Physical Exam  BP (!) 139/100   Pulse 92   Temp 98.4 F (36.9 C) (Oral)   Resp (!) 32   Ht 5\' 4"  (1.626 m)   Wt 77.1 kg   LMP 12/19/2021 (Exact Date)   SpO2 100%   BMI 29.18 kg/m  Gen:   Awake, actively vomiting at time of initial evaluation Resp:  Normal effort, increased respiratory rate secondary to active vomiting MSK:   Moves extremities without difficulty  Other:  Tenderness palpation epigastric region without rebound, rigidity, guarding  Medical Decision Making  Medically screening exam initiated at 8:22 AM.  Appropriate orders placed.  Sabrie Moritz Handy was informed that the remainder of the evaluation will be completed by another provider, this initial triage assessment does not replace that evaluation, and the importance of remaining in the ED until their evaluation is complete.  Workup initiated   Anselmo Pickler, [place] 01/04/22 [id number]

## 2022-01-04 NOTE — Discharge Instructions (Addendum)
We evaluated you for your nausea, vomiting, diarrhea.  Your flu test was negative and your lab testing was reassuring.  Please follow-up with gastroenterology.  You can call [facility] for a follow-up appointment.  Please return to the emergency department if you have any worsening or recurrent symptoms, blood in your vomit, black stools, inability to tolerate solids or liquids, or severe abdominal pain.

## 2022-01-04 NOTE — ED Provider Notes (Signed)
[facility] EMERGENCY DEPARTMENT Provider Note  CSN: [id number] Arrival date & time: 01/04/22 0932  Chief Complaint(s) Abdominal Pain  HPI Holly Gordon is a 41 y.o. female with history of recurrent episodes of nausea, vomiting, diarrhea presenting with nausea, vomiting, diarrhea.  She also reports lower abdominal pain.  She reports this is similar to previous episodes, began yesterday evening.  She also reports right arm pain which is new.  No trauma to her arm.  She reports her pain is in her lower abdomen.  No vaginal bleeding, discharge, dysuria.  No back or flank pain.  Symptoms are constant.  States she got Phenergan which improved her symptoms.  She reports she was sexually active 1 month ago.   Past Medical History Past Medical History:  Diagnosis Date   Anemia    Anxiety    Bacterial vaginitis 08/26/2016   Depression    had counseling in the passt no meds   History of abnormal cervical Pap smear    Patient Active Problem List   Diagnosis Date Noted   High grade squamous intraepithelial lesion (HGSIL), grade 3 CIN, on biopsy of cervix 10/09/2021   Abnormal uterine bleeding 07/03/2021   Depression, postpartum 02/06/2019   Hypothyroidism 05/13/2017   History of cesarean delivery 04/03/2017   Marijuana abuse 09/18/2016   History of substance abuse (HCC) 09/09/2016   Tobacco abuse 09/09/2016   History of depression 09/09/2016   Home Medication(s) Prior to Admission medications   Medication Sig Start Date End Date Taking? Authorizing Provider  promethazine (PHENERGAN) 25 MG suppository Place 1 suppository (25 mg total) rectally every 6 (six) hours as needed for nausea or vomiting. 01/04/22  Yes Lonell Grandchild, MD  acetaminophen (TYLENOL) 500 MG tablet Take 1,000 mg by mouth every 6 (six) hours as needed for mild pain.    [provider]  busPIRone (BUSPAR) 15 MG tablet Take 15 mg by mouth 3 (three) times daily. 09/24/20   [provider]  carbamazepine (CARBATROL) 200 MG 12 hr capsule Take 200 mg by mouth 2 (two) times daily. 03/21/21   [provider]  clonazePAM (KLONOPIN) 0.5 MG tablet Take 0.5 mg by mouth daily as needed for anxiety. 03/26/20   [provider]  dicyclomine (BENTYL) 20 MG tablet Take 1 tablet (20 mg total) by mouth every 8 (eight) hours as needed for spasms (Abdominal cramping). 12/19/21   Ward, Layla Maw, DO  FLUoxetine (PROZAC) 40 MG capsule Take 40 mg by mouth daily. 09/17/20   [provider]  ibuprofen (ADVIL) 200 MG tablet Take 800 mg by mouth every 6 (six) hours as needed for mild pain.    [provider]  lamoTRIgine (LAMICTAL) 200 MG tablet Take 200 mg by mouth daily. 04/06/21   [provider]  levocetirizine (XYZAL) 5 MG tablet Take 5 mg by mouth at bedtime. 03/25/21   [provider]  omeprazole (PRILOSEC) 20 MG capsule Take 20 mg by mouth daily. 03/25/21   [provider]  omeprazole (PRILOSEC) 20 MG capsule Take 1 capsule (20 mg total) by mouth daily. 04/21/21   Horton, Mayer Masker, MD  ondansetron (ZOFRAN-ODT) 4 MG disintegrating tablet Take 1 tablet (4 mg total) by mouth every 6 (six) hours as needed for nausea or vomiting. 06/13/21   Sharyn Creamer, MD  promethazine (PHENERGAN) 25 MG tablet Take 1 tablet (25 mg total) by mouth every 6 (six) hours as needed for nausea or vomiting. 12/19/21   Ward, Layla Maw,  DO  sertraline (ZOLOFT) 100 MG tablet Take 1 tablet (100 mg total) by mouth daily. In 1 week increase to 150 (1.5 pills) daily. Patient not taking: Reported on 06/22/2020 06/29/19   Federico Flake, MD  tranexamic acid (LYSTEDA) 650 MG TABS tablet Take 2 tablets (1,300 mg total) by mouth 3 (three) times daily. 11/14/21   Reva Bores, MD  VENTOLIN HFA 108 [phone] Base) MCG/ACT inhaler Inhale 1-2 puffs into the lungs 4 (four) times daily as needed for wheezing or shortness of breath. 03/11/21   [provider]  Vitamin D,  Ergocalciferol, (DRISDOL) 1.25 MG (50000 UNIT) CAPS capsule Take 50,000 Units by mouth every Monday. 03/25/21   [provider]                                                                                                                                    Past Surgical History Past Surgical History:  Procedure Laterality Date   CESAREAN SECTION  2008   x1   CESAREAN SECTION N/A 04/03/2017   Procedure: CESAREAN SECTION;  Surgeon: Hermina Staggers, MD;  Location: [facility] BIRTHING SUITES;  Service: Obstetrics;  Laterality: N/A;   COLPOSCOPY     abnormal pap   INDUCED ABORTION     TONSILLECTOMY     WISDOM TOOTH EXTRACTION     x4   Family History Family History  Problem Relation Age of Onset   Hypertension Father    Diabetes Father    Kidney disease Maternal Grandmother    Heart disease Maternal Grandmother    Alzheimer's disease Maternal Grandfather     Social History Social History   Tobacco Use   Smoking status: Every Day    Packs/day: 0.10    Types: Cigarettes   Smokeless tobacco: Never  Vaping Use   Vaping Use: Never used  Substance Use Topics   Alcohol use: No   Drug use: No    Comment: uses vape a month ago   Allergies Tamiflu [oseltamivir phosphate], Morphine and related, and Sulfa antibiotics  Review of Systems Review of Systems  All other systems reviewed and are negative.   Physical Exam Vital Signs  I have reviewed the triage vital signs BP (!) 154/83   Pulse 64   Temp 98.1 F (36.7 C) (Oral)   Resp 18   Ht 5\' 4"  (1.626 m)   Wt 77.1 kg   LMP 12/19/2021 (Exact Date)   SpO2 100%   BMI 29.18 kg/m  Physical Exam Vitals and nursing note reviewed.  Constitutional:      General: She is not in acute distress.    Appearance: She is well-developed.  HENT:     Head: Normocephalic and atraumatic.     Mouth/Throat:     Mouth: Mucous membranes are moist.  Eyes:     Pupils: Pupils are equal, round, and reactive to light.  Cardiovascular:     Rate  and Rhythm: Normal rate and regular rhythm.     Heart sounds: No murmur heard. Pulmonary:     Effort: Pulmonary effort is normal. No respiratory distress.     Breath sounds: Normal breath sounds.  Abdominal:     General: Abdomen is flat.     Palpations: Abdomen is soft.     Tenderness: There is no abdominal tenderness. There is no guarding or rebound.  Musculoskeletal:        General: No tenderness.     Right lower leg: No edema.     Left lower leg: No edema.     Comments: Full range of motion of the right upper extremity, no focal tenderness, no erythema, no edema  Skin:    General: Skin is warm and dry.  Neurological:     General: No focal deficit present.     Mental Status: She is alert. Mental status is at baseline.  Psychiatric:        Mood and Affect: Mood normal.        Behavior: Behavior normal.     ED Results and Treatments Labs (all labs ordered are listed, but only abnormal results are displayed) Labs Reviewed  COMPREHENSIVE METABOLIC PANEL - Abnormal; Notable for the following components:      Result Value   CO2 21 (*)    Glucose, Bld 216 (*)    All other components within normal limits  CBC - Abnormal; Notable for the following components:   WBC 14.5 (*)    All other components within normal limits  URINALYSIS, ROUTINE W REFLEX MICROSCOPIC - Abnormal; Notable for the following components:   Glucose, UA 150 (*)    Ketones, ur 20 (*)    Protein, ur 30 (*)    All other components within normal limits  I-STAT BETA HCG BLOOD, ED ([facility], [facility], [facility] ONLY) - Abnormal; Notable for the following components:   I-stat hCG, quantitative 33.2 (*)    All other components within normal limits  RESP PANEL BY RT-PCR (FLU A&B, COVID) ARPGX2  LIPASE, BLOOD  HCG, QUANTITATIVE, PREGNANCY                                                                                                                          Radiology DG Chest 2 View  Result Date: 01/04/2022 CLINICAL DATA:  Chest  and right arm pain EXAM: CHEST - 2 VIEW COMPARISON:  None Available. FINDINGS: The heart size and mediastinal contours are within normal limits. Both lungs are clear. The visualized skeletal structures are unremarkable. IMPRESSION: No active cardiopulmonary disease. Electronically Signed   By: Tiburcio Pea M.D.   On: 01/04/2022 09:37    Pertinent labs & imaging results that were available during my care of the patient were reviewed by me and considered in my medical decision making (see MDM for details).  Medications Ordered in ED Medications  promethazine (PHENERGAN) injection 12.5 mg (12.5 mg Intramuscular Given 01/04/22 0901)  ondansetron (ZOFRAN) injection  4 mg (4 mg Intravenous Given 01/04/22 1250)  ketorolac (TORADOL) 15 MG/ML injection 15 mg (15 mg Intravenous Given 01/04/22 1320)                                                                                                                                     Procedures Procedures  (including critical care time)  Medical Decision Making / ED Course   MDM:  41 year old female presenting to the emergency department with nausea, vomiting.  Patient overall well-appearing, reassuring vital signs, no tachycardia or fever.  Suspect symptoms are most likely related to chronic abdominal pain with nausea and vomiting, no peritoneal signs or specific symptoms to suggest acute intra-abdominal process such as appendicitis, cholecystitis, perforation, obstruction, volvulus.  She still having bowel movements.  Incidentally beta-hCG on i-STAT was positive although very low at 33, given pain is primarily in the lower abdomen, patient has had prior tubal ligation, will obtain ultrasound to evaluate for ectopic pregnancy.  Unclear cause of right arm pain but exam reassuring.  No swelling or erythema to suggest cellulitis, DVT, no focal bony tenderness to suggest fracture, no bruising.  Clinical Course as of 01/04/22 1411  Sat Jan 04, 2022  1246 HCG,  Beta Chain, Quant, S: <1 Suspect earlier hCG was inaccurate as it was a i-STAT test.  Repeat testing in the clinical laboratory was entirely negative.  Patient denies any recent sexual activity so I feel that the clinical laboratory test is likely the correct result.  We will treat symptoms and reassess. [WS]  4098 Patient feeling better able to tolerate p.o. No ongoing abdominal pain, nausea or vomiting. Will discharge patient to home. All questions answered. Patient comfortable with plan of discharge. Return precautions discussed with patient and specified on the after visit summary.  [WS]    Clinical Course User Index [WS] Cristie Hem, MD     Additional history obtained: -Additional history obtained from family -External records from outside source obtained and reviewed including: Chart review including previous notes, labs, imaging, consultation notes including ED visit mid October '23   Lab Tests: -I ordered, reviewed, and interpreted labs.   The pertinent results include:   Labs Reviewed  COMPREHENSIVE METABOLIC PANEL - Abnormal; Notable for the following components:      Result Value   CO2 21 (*)    Glucose, Bld 216 (*)    All other components within normal limits  CBC - Abnormal; Notable for the following components:   WBC 14.5 (*)    All other components within normal limits  URINALYSIS, ROUTINE W REFLEX MICROSCOPIC - Abnormal; Notable for the following components:   Glucose, UA 150 (*)    Ketones, ur 20 (*)    Protein, ur 30 (*)    All other components within normal limits  I-STAT BETA HCG BLOOD, ED ([facility], [facility], [facility] ONLY) - Abnormal; Notable for the following components:   I-stat  hCG, quantitative 33.2 (*)    All other components within normal limits  RESP PANEL BY RT-PCR (FLU A&B, COVID) ARPGX2  LIPASE, BLOOD  HCG, QUANTITATIVE, PREGNANCY    Notable for likely erroneous POCT testing, mild hyperglycemia, elevated WBC which is likely reactive from vomiting  EKG    EKG Interpretation  Date/Time:  Saturday January 04 2022 08:58:46 EDT Ventricular Rate:  84 PR Interval:  200 QRS Duration: 78 QT Interval:  378 QTC Calculation: 446 R Axis:   -34 Text Interpretation: Normal sinus rhythm with sinus arrhythmia Abnormal ECG Confirmed by Alvino BloodScheving, Zymeir Salminen ([id number]) on 01/04/2022 11:42:51 AM          Medicines ordered and prescription drug management: Meds ordered this encounter  Medications   promethazine (PHENERGAN) injection 12.5 mg   ondansetron (ZOFRAN) injection 4 mg   ketorolac (TORADOL) 15 MG/ML injection 15 mg   promethazine (PHENERGAN) 25 MG suppository    Sig: Place 1 suppository (25 mg total) rectally every 6 (six) hours as needed for nausea or vomiting.    Dispense:  12 each    Refill:  0    -I have reviewed the patients home medicines and have made adjustments as needed    Reevaluation: After the interventions noted above, I reevaluated the patient and found that they have resolved  Co morbidities that complicate the patient evaluation  Past Medical History:  Diagnosis Date   Anemia    Anxiety    Bacterial vaginitis 08/26/2016   Depression    had counseling in the passt no meds   History of abnormal cervical Pap smear       Dispostion: Disposition decision including need for hospitalization was considered, and patient discharged from emergency department.    Final Clinical Impression(s) / ED Diagnoses Final diagnoses:  Nausea vomiting and diarrhea     This chart was dictated using voice recognition software.  Despite best efforts to proofread,  errors can occur which can change the documentation meaning.    Lonell GrandchildScheving, Darroll Bredeson L, MD 01/04/22 [id number]

## 2022-01-04 NOTE — ED Triage Notes (Addendum)
Pt arrived POV from home c/o generalized abdominal pain, N/V/D since last night. Pt states her daughter tested positive for the flu on Tuesday.

## 2022-01-13 ENCOUNTER — Other Ambulatory Visit: Payer: Self-pay | Admitting: Family Medicine

## 2022-01-13 DIAGNOSIS — N939 Abnormal uterine and vaginal bleeding, unspecified: Secondary | ICD-10-CM

## 2022-01-27 DIAGNOSIS — Z79899 Other long term (current) drug therapy: Secondary | ICD-10-CM | POA: Diagnosis not present

## 2022-02-17 DIAGNOSIS — M545 Low back pain, unspecified: Secondary | ICD-10-CM | POA: Diagnosis not present

## 2022-02-17 DIAGNOSIS — F410 Panic disorder [episodic paroxysmal anxiety] without agoraphobia: Secondary | ICD-10-CM | POA: Diagnosis not present

## 2022-02-17 DIAGNOSIS — Z6831 Body mass index (BMI) 31.0-31.9, adult: Secondary | ICD-10-CM | POA: Diagnosis not present

## 2022-02-17 DIAGNOSIS — M62838 Other muscle spasm: Secondary | ICD-10-CM | POA: Diagnosis not present

## 2022-02-17 DIAGNOSIS — R300 Dysuria: Secondary | ICD-10-CM | POA: Diagnosis not present

## 2022-02-17 DIAGNOSIS — F419 Anxiety disorder, unspecified: Secondary | ICD-10-CM | POA: Diagnosis not present

## 2022-02-17 DIAGNOSIS — Z79899 Other long term (current) drug therapy: Secondary | ICD-10-CM | POA: Diagnosis not present

## 2022-02-17 DIAGNOSIS — Z3200 Encounter for pregnancy test, result unknown: Secondary | ICD-10-CM | POA: Diagnosis not present

## 2022-02-19 DIAGNOSIS — Z79899 Other long term (current) drug therapy: Secondary | ICD-10-CM | POA: Diagnosis not present

## 2022-03-20 DIAGNOSIS — Z79899 Other long term (current) drug therapy: Secondary | ICD-10-CM | POA: Diagnosis not present

## 2022-03-20 DIAGNOSIS — M545 Low back pain, unspecified: Secondary | ICD-10-CM | POA: Diagnosis not present

## 2022-03-20 DIAGNOSIS — Z6831 Body mass index (BMI) 31.0-31.9, adult: Secondary | ICD-10-CM | POA: Diagnosis not present

## 2022-03-20 DIAGNOSIS — M129 Arthropathy, unspecified: Secondary | ICD-10-CM | POA: Diagnosis not present

## 2022-03-20 DIAGNOSIS — E559 Vitamin D deficiency, unspecified: Secondary | ICD-10-CM | POA: Diagnosis not present

## 2022-03-20 DIAGNOSIS — Z3200 Encounter for pregnancy test, result unknown: Secondary | ICD-10-CM | POA: Diagnosis not present

## 2022-03-24 DIAGNOSIS — Z79899 Other long term (current) drug therapy: Secondary | ICD-10-CM | POA: Diagnosis not present

## 2022-03-28 DIAGNOSIS — Z79899 Other long term (current) drug therapy: Secondary | ICD-10-CM | POA: Diagnosis not present

## 2022-03-28 DIAGNOSIS — M5137 Other intervertebral disc degeneration, lumbosacral region: Secondary | ICD-10-CM | POA: Diagnosis not present

## 2022-03-28 DIAGNOSIS — Z6832 Body mass index (BMI) 32.0-32.9, adult: Secondary | ICD-10-CM | POA: Diagnosis not present

## 2022-03-28 DIAGNOSIS — F32A Depression, unspecified: Secondary | ICD-10-CM | POA: Diagnosis not present

## 2022-03-28 DIAGNOSIS — M62838 Other muscle spasm: Secondary | ICD-10-CM | POA: Diagnosis not present

## 2022-04-28 DIAGNOSIS — R110 Nausea: Secondary | ICD-10-CM | POA: Diagnosis not present

## 2022-04-28 DIAGNOSIS — M62838 Other muscle spasm: Secondary | ICD-10-CM | POA: Diagnosis not present

## 2022-04-28 DIAGNOSIS — M5137 Other intervertebral disc degeneration, lumbosacral region: Secondary | ICD-10-CM | POA: Diagnosis not present

## 2022-04-28 DIAGNOSIS — Z79899 Other long term (current) drug therapy: Secondary | ICD-10-CM | POA: Diagnosis not present

## 2022-04-28 DIAGNOSIS — Z6833 Body mass index (BMI) 33.0-33.9, adult: Secondary | ICD-10-CM | POA: Diagnosis not present

## 2022-05-01 DIAGNOSIS — Z79899 Other long term (current) drug therapy: Secondary | ICD-10-CM | POA: Diagnosis not present

## 2022-05-29 DIAGNOSIS — Z79899 Other long term (current) drug therapy: Secondary | ICD-10-CM | POA: Diagnosis not present

## 2022-05-29 DIAGNOSIS — M5137 Other intervertebral disc degeneration, lumbosacral region: Secondary | ICD-10-CM | POA: Diagnosis not present

## 2022-05-29 DIAGNOSIS — G2581 Restless legs syndrome: Secondary | ICD-10-CM | POA: Diagnosis not present

## 2022-05-29 DIAGNOSIS — R110 Nausea: Secondary | ICD-10-CM | POA: Diagnosis not present

## 2022-05-29 DIAGNOSIS — F32A Depression, unspecified: Secondary | ICD-10-CM | POA: Diagnosis not present

## 2022-05-29 DIAGNOSIS — M62838 Other muscle spasm: Secondary | ICD-10-CM | POA: Diagnosis not present

## 2022-05-29 DIAGNOSIS — N914 Secondary oligomenorrhea: Secondary | ICD-10-CM | POA: Diagnosis not present

## 2022-05-29 DIAGNOSIS — Z6833 Body mass index (BMI) 33.0-33.9, adult: Secondary | ICD-10-CM | POA: Diagnosis not present

## 2022-06-04 DIAGNOSIS — Z79899 Other long term (current) drug therapy: Secondary | ICD-10-CM | POA: Diagnosis not present

## 2022-06-30 DIAGNOSIS — Z79899 Other long term (current) drug therapy: Secondary | ICD-10-CM | POA: Diagnosis not present

## 2022-06-30 DIAGNOSIS — Z3200 Encounter for pregnancy test, result unknown: Secondary | ICD-10-CM | POA: Diagnosis not present

## 2022-06-30 DIAGNOSIS — Z6834 Body mass index (BMI) 34.0-34.9, adult: Secondary | ICD-10-CM | POA: Diagnosis not present

## 2022-06-30 DIAGNOSIS — M62838 Other muscle spasm: Secondary | ICD-10-CM | POA: Diagnosis not present

## 2022-06-30 DIAGNOSIS — F32A Depression, unspecified: Secondary | ICD-10-CM | POA: Diagnosis not present

## 2022-06-30 DIAGNOSIS — M5137 Other intervertebral disc degeneration, lumbosacral region: Secondary | ICD-10-CM | POA: Diagnosis not present

## 2022-06-30 DIAGNOSIS — F410 Panic disorder [episodic paroxysmal anxiety] without agoraphobia: Secondary | ICD-10-CM | POA: Diagnosis not present

## 2022-06-30 DIAGNOSIS — R110 Nausea: Secondary | ICD-10-CM | POA: Diagnosis not present

## 2022-06-30 DIAGNOSIS — G2581 Restless legs syndrome: Secondary | ICD-10-CM | POA: Diagnosis not present

## 2022-07-02 DIAGNOSIS — Z79899 Other long term (current) drug therapy: Secondary | ICD-10-CM | POA: Diagnosis not present

## 2022-07-14 ENCOUNTER — Encounter: Payer: Self-pay | Admitting: Emergency Medicine

## 2022-07-14 ENCOUNTER — Emergency Department
Admission: EM | Admit: 2022-07-14 | Discharge: 2022-07-14 | Disposition: A | Discharge status: Home / Self Care | Payer: Medicaid Other | Attending: Emergency Medicine | Admitting: Emergency Medicine

## 2022-07-14 ENCOUNTER — Other Ambulatory Visit: Payer: Self-pay

## 2022-07-14 DIAGNOSIS — R112 Nausea with vomiting, unspecified: Secondary | ICD-10-CM | POA: Diagnosis present

## 2022-07-14 DIAGNOSIS — R1115 Cyclical vomiting syndrome unrelated to migraine: Secondary | ICD-10-CM | POA: Diagnosis not present

## 2022-07-14 DIAGNOSIS — R109 Unspecified abdominal pain: Secondary | ICD-10-CM | POA: Diagnosis not present

## 2022-07-14 LAB — CBC
HCT: 43.3 % (ref 36.0–46.0)
Hemoglobin: 14.3 g/dL (ref 12.0–15.0)
MCH: 32.4 pg (ref 26.0–34.0)
MCHC: 33 g/dL (ref 30.0–36.0)
MCV: 98 fL (ref 80.0–100.0)
Platelets: 258 10*3/uL (ref 150–400)
RBC: 4.42 MIL/uL (ref 3.87–5.11)
RDW: 12.1 % (ref 11.5–15.5)
WBC: 10.1 10*3/uL (ref 4.0–10.5)
nRBC: 0 % (ref 0.0–0.2)

## 2022-07-14 LAB — COMPREHENSIVE METABOLIC PANEL
ALT: 22 U/L (ref 0–44)
AST: 27 U/L (ref 15–41)
Albumin: 4.8 g/dL (ref 3.5–5.0)
Alkaline Phosphatase: 120 U/L (ref 38–126)
Anion gap: 12 (ref 5–15)
BUN: 7 mg/dL (ref 6–20)
CO2: 22 mmol/L (ref 22–32)
Calcium: 9.5 mg/dL (ref 8.9–10.3)
Chloride: 105 mmol/L (ref 98–111)
Creatinine, Ser: 0.84 mg/dL (ref 0.44–1.00)
GFR, Estimated: >60 mL/min (ref >60)
Glucose, Bld: 166 mg/dL — ABNORMAL HIGH (ref 70–99)
Potassium: 3.5 mmol/L (ref 3.5–5.1)
Sodium: 139 mmol/L (ref 135–145)
Total Bilirubin: 0.6 mg/dL (ref 0.3–1.2)
Total Protein: 7.9 g/dL (ref 6.5–8.1)

## 2022-07-14 LAB — LIPASE, BLOOD: Lipase: 26 U/L (ref 11–51)

## 2022-07-14 MED ORDER — PROMETHAZINE HCL 25 MG PO TABS: 25.0000 mg | ORAL_TABLET | Freq: Four times a day (QID) | ORAL | 1 refills | Status: DC | PRN

## 2022-07-14 MED ORDER — SODIUM CHLORIDE 0.9 % IV SOLN
25.0000 mg | Freq: Four times a day (QID) | INTRAVENOUS | Status: DC | PRN
  Administered 2022-07-14: 25 mg via INTRAVENOUS
  Filled 2022-07-14: qty 25

## 2022-07-14 MED ORDER — SODIUM CHLORIDE 0.9 % IV SOLN: Freq: Once | INTRAVENOUS | Status: AC

## 2022-07-14 NOTE — ED Triage Notes (Signed)
Pt to ED for emesis since 2100 last night. States tried phenergan rectal suppository with no relief. +diarrhea. Denies fevers. States this happens every couple months and phenergan usually helps. +generalized abd pain

## 2022-07-14 NOTE — ED Provider Notes (Signed)
[facility] Provider Note    Event Date/Time   First MD Initiated Contact with Patient 07/14/22 [phone]     (approximate)   History   Emesis   HPI  Holly Gordon is a 42 y.o. female with a history of cyclical vomiting syndrome, likely related to marijuana who presents with complaints of nausea vomiting.  She reports she took a Phenergan at home which typically improves but she continues to have nausea vomiting abdominal cramping.  She reports this is similar to past events.     Physical Exam   Triage Vital Signs: ED Triage Vitals  Enc Vitals Group     BP 07/14/22 0726 (!) 157/107     Pulse Rate 07/14/22 0726 80     Resp 07/14/22 0726 18     Temp 07/14/22 0726 98.9 F (37.2 C)     Temp src --      SpO2 07/14/22 0726 100 %     Weight 07/14/22 0727 90.7 kg (200 lb)     Height 07/14/22 0727 1.626 m (5\' 4" )     Head Circumference --      Peak Flow --      Pain Score 07/14/22 0727 6     Pain Loc --      Pain Edu? --      Excl. in GC? --     Most recent vital signs: Vitals:   07/14/22 0726 07/14/22 0958  BP: (!) 157/107 (!) 140/90  Pulse: 80 78  Resp: 18 18  Temp: 98.9 F (37.2 C)   SpO2: 100% 99%     General: Awake, no distress.  CV:  Good peripheral perfusion.  Resp:  Normal effort.  Abd:  No distention.  Overall soft, reassuring abdominal exam Other:     ED Results / Procedures / Treatments   Labs (all labs ordered are listed, but only abnormal results are displayed) Labs Reviewed  COMPREHENSIVE METABOLIC PANEL - Abnormal; Notable for the following components:      Result Value   Glucose, Bld 166 (*)    All other components within normal limits  LIPASE, BLOOD  CBC     EKG     RADIOLOGY     PROCEDURES:  Critical Care performed:   Procedures   MEDICATIONS ORDERED IN ED: Medications  promethazine (PHENERGAN) 25 mg in sodium chloride 0.9 % 50 mL IVPB (0 mg Intravenous Stopped 07/14/22 0957)  0.9 %   sodium chloride infusion (0 mLs Intravenous Stopped 07/14/22 0957)     IMPRESSION / MDM / ASSESSMENT AND PLAN / ED COURSE  I reviewed the triage vital signs and the nursing notes. Patient's presentation is most consistent with exacerbation of chronic illness.  Patient presents with nausea vomiting as detailed above, counseled patient this is likely related to marijuana usage, counseled marijuana cessation/THC cessation  Will give IV Phenergan, IV fluids, check labs and reevaluate.  Lab work reviewed and is unremarkable  Patient feeling better after IV Phenergan and fluids, appropriate for discharge at this time with outpatient follow-up      FINAL CLINICAL IMPRESSION(S) / ED DIAGNOSES   Final diagnoses:  Cyclic vomiting syndrome     Rx / DC Orders   ED Discharge Orders          Ordered    promethazine (PHENERGAN) 25 MG tablet  Every 6 hours PRN        07/14/22 0948  Note:  This document was prepared using Dragon voice recognition software and may include unintentional dictation errors.   Jene Every, MD 07/14/22 1137

## 2022-07-28 DIAGNOSIS — M62830 Muscle spasm of back: Secondary | ICD-10-CM | POA: Diagnosis not present

## 2022-07-28 DIAGNOSIS — E559 Vitamin D deficiency, unspecified: Secondary | ICD-10-CM | POA: Diagnosis not present

## 2022-07-28 DIAGNOSIS — Z6835 Body mass index (BMI) 35.0-35.9, adult: Secondary | ICD-10-CM | POA: Diagnosis not present

## 2022-07-28 DIAGNOSIS — Z3200 Encounter for pregnancy test, result unknown: Secondary | ICD-10-CM | POA: Diagnosis not present

## 2022-07-28 DIAGNOSIS — M5137 Other intervertebral disc degeneration, lumbosacral region: Secondary | ICD-10-CM | POA: Diagnosis not present

## 2022-07-28 DIAGNOSIS — R110 Nausea: Secondary | ICD-10-CM | POA: Diagnosis not present

## 2022-07-28 DIAGNOSIS — Z79899 Other long term (current) drug therapy: Secondary | ICD-10-CM | POA: Diagnosis not present

## 2022-07-28 DIAGNOSIS — Z13220 Encounter for screening for lipoid disorders: Secondary | ICD-10-CM | POA: Diagnosis not present

## 2022-07-28 DIAGNOSIS — M545 Low back pain, unspecified: Secondary | ICD-10-CM | POA: Diagnosis not present

## 2022-07-30 DIAGNOSIS — Z79899 Other long term (current) drug therapy: Secondary | ICD-10-CM | POA: Diagnosis not present

## 2022-08-27 DIAGNOSIS — R4586 Emotional lability: Secondary | ICD-10-CM | POA: Diagnosis not present

## 2022-08-27 DIAGNOSIS — F419 Anxiety disorder, unspecified: Secondary | ICD-10-CM | POA: Diagnosis not present

## 2022-08-27 DIAGNOSIS — Z79899 Other long term (current) drug therapy: Secondary | ICD-10-CM | POA: Diagnosis not present

## 2022-08-27 DIAGNOSIS — M545 Low back pain, unspecified: Secondary | ICD-10-CM | POA: Diagnosis not present

## 2022-08-27 DIAGNOSIS — Z3200 Encounter for pregnancy test, result unknown: Secondary | ICD-10-CM | POA: Diagnosis not present

## 2022-08-27 DIAGNOSIS — E7800 Pure hypercholesterolemia, unspecified: Secondary | ICD-10-CM | POA: Diagnosis not present

## 2022-08-27 DIAGNOSIS — Z6835 Body mass index (BMI) 35.0-35.9, adult: Secondary | ICD-10-CM | POA: Diagnosis not present

## 2022-08-27 DIAGNOSIS — G2581 Restless legs syndrome: Secondary | ICD-10-CM | POA: Diagnosis not present

## 2022-08-27 DIAGNOSIS — F32A Depression, unspecified: Secondary | ICD-10-CM | POA: Diagnosis not present

## 2022-08-29 DIAGNOSIS — Z79899 Other long term (current) drug therapy: Secondary | ICD-10-CM | POA: Diagnosis not present

## 2022-09-24 DIAGNOSIS — Z79899 Other long term (current) drug therapy: Secondary | ICD-10-CM | POA: Diagnosis not present

## 2022-09-24 DIAGNOSIS — E6609 Other obesity due to excess calories: Secondary | ICD-10-CM | POA: Diagnosis not present

## 2022-09-24 DIAGNOSIS — Z6834 Body mass index (BMI) 34.0-34.9, adult: Secondary | ICD-10-CM | POA: Diagnosis not present

## 2022-09-24 DIAGNOSIS — M62830 Muscle spasm of back: Secondary | ICD-10-CM | POA: Diagnosis not present

## 2022-09-24 DIAGNOSIS — N911 Secondary amenorrhea: Secondary | ICD-10-CM | POA: Diagnosis not present

## 2022-09-24 DIAGNOSIS — G2581 Restless legs syndrome: Secondary | ICD-10-CM | POA: Diagnosis not present

## 2022-09-24 DIAGNOSIS — M545 Low back pain, unspecified: Secondary | ICD-10-CM | POA: Diagnosis not present

## 2022-09-24 DIAGNOSIS — R110 Nausea: Secondary | ICD-10-CM | POA: Diagnosis not present

## 2022-09-26 DIAGNOSIS — Z79899 Other long term (current) drug therapy: Secondary | ICD-10-CM | POA: Diagnosis not present

## 2022-09-29 DIAGNOSIS — Z79899 Other long term (current) drug therapy: Secondary | ICD-10-CM | POA: Diagnosis not present

## 2022-10-23 DIAGNOSIS — M62830 Muscle spasm of back: Secondary | ICD-10-CM | POA: Diagnosis not present

## 2022-10-23 DIAGNOSIS — N911 Secondary amenorrhea: Secondary | ICD-10-CM | POA: Diagnosis not present

## 2022-10-23 DIAGNOSIS — Z6834 Body mass index (BMI) 34.0-34.9, adult: Secondary | ICD-10-CM | POA: Diagnosis not present

## 2022-10-23 DIAGNOSIS — Z79899 Other long term (current) drug therapy: Secondary | ICD-10-CM | POA: Diagnosis not present

## 2022-10-23 DIAGNOSIS — R110 Nausea: Secondary | ICD-10-CM | POA: Diagnosis not present

## 2022-10-23 DIAGNOSIS — M545 Low back pain, unspecified: Secondary | ICD-10-CM | POA: Diagnosis not present

## 2022-10-23 DIAGNOSIS — E6609 Other obesity due to excess calories: Secondary | ICD-10-CM | POA: Diagnosis not present

## 2022-10-23 DIAGNOSIS — Z13220 Encounter for screening for lipoid disorders: Secondary | ICD-10-CM | POA: Diagnosis not present

## 2022-10-27 DIAGNOSIS — Z79899 Other long term (current) drug therapy: Secondary | ICD-10-CM | POA: Diagnosis not present

## 2022-11-16 ENCOUNTER — Encounter: Payer: Self-pay | Admitting: Family Medicine

## 2022-11-25 DIAGNOSIS — Z23 Encounter for immunization: Secondary | ICD-10-CM | POA: Diagnosis not present

## 2022-11-25 DIAGNOSIS — R030 Elevated blood-pressure reading, without diagnosis of hypertension: Secondary | ICD-10-CM | POA: Diagnosis not present

## 2022-11-25 DIAGNOSIS — M545 Low back pain, unspecified: Secondary | ICD-10-CM | POA: Diagnosis not present

## 2022-11-25 DIAGNOSIS — E6609 Other obesity due to excess calories: Secondary | ICD-10-CM | POA: Diagnosis not present

## 2022-11-25 DIAGNOSIS — R5383 Other fatigue: Secondary | ICD-10-CM | POA: Diagnosis not present

## 2022-11-25 DIAGNOSIS — R4586 Emotional lability: Secondary | ICD-10-CM | POA: Diagnosis not present

## 2022-11-25 DIAGNOSIS — N911 Secondary amenorrhea: Secondary | ICD-10-CM | POA: Diagnosis not present

## 2022-11-25 DIAGNOSIS — F419 Anxiety disorder, unspecified: Secondary | ICD-10-CM | POA: Diagnosis not present

## 2022-11-25 DIAGNOSIS — G2581 Restless legs syndrome: Secondary | ICD-10-CM | POA: Diagnosis not present

## 2022-11-25 DIAGNOSIS — Z79899 Other long term (current) drug therapy: Secondary | ICD-10-CM | POA: Diagnosis not present

## 2022-11-25 DIAGNOSIS — Z6836 Body mass index (BMI) 36.0-36.9, adult: Secondary | ICD-10-CM | POA: Diagnosis not present

## 2022-11-25 DIAGNOSIS — E559 Vitamin D deficiency, unspecified: Secondary | ICD-10-CM | POA: Diagnosis not present

## 2022-11-25 DIAGNOSIS — Z13220 Encounter for screening for lipoid disorders: Secondary | ICD-10-CM | POA: Diagnosis not present

## 2022-11-28 DIAGNOSIS — Z79899 Other long term (current) drug therapy: Secondary | ICD-10-CM | POA: Diagnosis not present

## 2022-12-24 DIAGNOSIS — M545 Low back pain, unspecified: Secondary | ICD-10-CM | POA: Diagnosis not present

## 2022-12-24 DIAGNOSIS — Z6837 Body mass index (BMI) 37.0-37.9, adult: Secondary | ICD-10-CM | POA: Diagnosis not present

## 2022-12-24 DIAGNOSIS — F32A Depression, unspecified: Secondary | ICD-10-CM | POA: Diagnosis not present

## 2022-12-24 DIAGNOSIS — R4586 Emotional lability: Secondary | ICD-10-CM | POA: Diagnosis not present

## 2022-12-24 DIAGNOSIS — N911 Secondary amenorrhea: Secondary | ICD-10-CM | POA: Diagnosis not present

## 2022-12-24 DIAGNOSIS — G2581 Restless legs syndrome: Secondary | ICD-10-CM | POA: Diagnosis not present

## 2022-12-24 DIAGNOSIS — E6609 Other obesity due to excess calories: Secondary | ICD-10-CM | POA: Diagnosis not present

## 2022-12-24 DIAGNOSIS — R030 Elevated blood-pressure reading, without diagnosis of hypertension: Secondary | ICD-10-CM | POA: Diagnosis not present

## 2022-12-24 DIAGNOSIS — F419 Anxiety disorder, unspecified: Secondary | ICD-10-CM | POA: Diagnosis not present

## 2022-12-24 DIAGNOSIS — Z79899 Other long term (current) drug therapy: Secondary | ICD-10-CM | POA: Diagnosis not present

## 2023-01-07 DIAGNOSIS — R1115 Cyclical vomiting syndrome unrelated to migraine: Secondary | ICD-10-CM | POA: Diagnosis not present

## 2023-01-18 ENCOUNTER — Emergency Department
Admission: EM | Admit: 2023-01-18 | Discharge: 2023-01-18 | Disposition: A | Discharge status: Home / Self Care | Payer: Medicaid Other | Attending: Student in an Organized Health Care Education/Training Program | Admitting: Student in an Organized Health Care Education/Training Program

## 2023-01-18 ENCOUNTER — Emergency Department: Payer: Medicaid Other

## 2023-01-18 DIAGNOSIS — N83202 Unspecified ovarian cyst, left side: Secondary | ICD-10-CM | POA: Insufficient documentation

## 2023-01-18 DIAGNOSIS — K439 Ventral hernia without obstruction or gangrene: Secondary | ICD-10-CM | POA: Diagnosis not present

## 2023-01-18 DIAGNOSIS — R1115 Cyclical vomiting syndrome unrelated to migraine: Secondary | ICD-10-CM | POA: Insufficient documentation

## 2023-01-18 DIAGNOSIS — R1030 Lower abdominal pain, unspecified: Secondary | ICD-10-CM | POA: Diagnosis not present

## 2023-01-18 DIAGNOSIS — R109 Unspecified abdominal pain: Secondary | ICD-10-CM | POA: Diagnosis present

## 2023-01-18 LAB — COMPREHENSIVE METABOLIC PANEL
ALT: 30 U/L (ref 0–44)
AST: 37 U/L (ref 15–41)
Albumin: 4.7 g/dL (ref 3.5–5.0)
Alkaline Phosphatase: 114 U/L (ref 38–126)
Anion gap: 12 (ref 5–15)
BUN: 8 mg/dL (ref 6–20)
CO2: 23 mmol/L (ref 22–32)
Calcium: 9.5 mg/dL (ref 8.9–10.3)
Chloride: 104 mmol/L (ref 98–111)
Creatinine, Ser: 0.78 mg/dL (ref 0.44–1.00)
GFR, Estimated: >60 mL/min (ref >60)
Glucose, Bld: 148 mg/dL — ABNORMAL HIGH (ref 70–99)
Potassium: 4.1 mmol/L (ref 3.5–5.1)
Sodium: 139 mmol/L (ref 135–145)
Total Bilirubin: 0.5 mg/dL (ref <1.2)
Total Protein: 7.9 g/dL (ref 6.5–8.1)

## 2023-01-18 LAB — CBC WITH DIFFERENTIAL/PLATELET
Abs Immature Granulocytes: 0.03 10*3/uL (ref 0.00–0.07)
Basophils Absolute: 0 10*3/uL (ref 0.0–0.1)
Basophils Relative: 0 %
Eosinophils Absolute: 0 10*3/uL (ref 0.0–0.5)
Eosinophils Relative: 0 %
HCT: 43.1 % (ref 36.0–46.0)
Hemoglobin: 14.7 g/dL (ref 12.0–15.0)
Immature Granulocytes: 0 %
Lymphocytes Relative: 10 %
Lymphs Abs: 1 10*3/uL (ref 0.7–4.0)
MCH: 32.7 pg (ref 26.0–34.0)
MCHC: 34.1 g/dL (ref 30.0–36.0)
MCV: 95.8 fL (ref 80.0–100.0)
Monocytes Absolute: 0.4 10*3/uL (ref 0.1–1.0)
Monocytes Relative: 4 %
Neutro Abs: 7.8 10*3/uL — ABNORMAL HIGH (ref 1.7–7.7)
Neutrophils Relative %: 86 %
Platelets: 219 10*3/uL (ref 150–400)
RBC: 4.5 MIL/uL (ref 3.87–5.11)
RDW: 12.2 % (ref 11.5–15.5)
WBC: 9.2 10*3/uL (ref 4.0–10.5)
nRBC: 0 % (ref 0.0–0.2)

## 2023-01-18 LAB — URINALYSIS, ROUTINE W REFLEX MICROSCOPIC
Bilirubin Urine: NEGATIVE
Glucose, UA: NEGATIVE mg/dL
Hgb urine dipstick: NEGATIVE
Ketones, ur: NEGATIVE mg/dL
Leukocytes,Ua: NEGATIVE
Nitrite: NEGATIVE
Protein, ur: 30 mg/dL — AB
Specific Gravity, Urine: 1.019 (ref 1.005–1.030)
pH: 8 (ref 5.0–8.0)

## 2023-01-18 LAB — LIPASE, BLOOD: Lipase: 26 U/L (ref 11–51)

## 2023-01-18 LAB — POC URINE PREG, ED: Preg Test, Ur: NEGATIVE

## 2023-01-18 MED ORDER — PROMETHAZINE HCL 25 MG/ML IJ SOLN
25.0000 mg | Freq: Once | INTRAMUSCULAR | Status: AC
  Administered 2023-01-18: 25 mg via INTRAMUSCULAR
  Filled 2023-01-18: qty 1

## 2023-01-18 MED ORDER — PROMETHAZINE HCL 25 MG PO TABS: 25.0000 mg | ORAL_TABLET | Freq: Four times a day (QID) | ORAL | 1 refills | Status: AC | PRN

## 2023-01-18 MED ORDER — DICYCLOMINE HCL 10 MG PO CAPS: 10.0000 mg | ORAL_CAPSULE | Freq: Three times a day (TID) | ORAL | 1 refills | Status: AC

## 2023-01-18 NOTE — ED Provider Triage Note (Signed)
Emergency Medicine Provider Triage Evaluation Note  Holly Gordon , a 42 y.o. female  was evaluated in triage.  Pt complains of nausea, vomiting, diarrhea that began this morning around 3 am.  Review of Systems  Positive: Lower abdominal pain, N/V/D Negative: Urinary symptoms, fevers  Physical Exam  There were no vitals taken for this visit. Gen:   Awake, no distress   Resp:  Normal effort  MSK:   Moves extremities without difficulty  Other:    Medical Decision Making  Medically screening exam initiated at 4:17 PM.  Appropriate orders placed.  Holly Gordon was informed that the remainder of the evaluation will be completed by another provider, this initial triage assessment does not replace that evaluation, and the importance of remaining in the ED until their evaluation is complete.     Cameron Ali, PA-C 01/18/23 [phone]

## 2023-01-18 NOTE — ED Triage Notes (Signed)
Pt to ED via POV from home. Pt reports vomiting that started at 3am. Pt also reports lower abd pain and diarrhea. Pt denies abd surgerys, fever, urinary symptoms.

## 2023-01-18 NOTE — ED Provider Notes (Signed)
[facility] Provider Note  Patient Contact: 7:24 PM (approximate)   History   Emesis   HPI  Holly Gordon is a 42 y.o. female who presents to the emergency department for cyclic vomiting, diarrhea, abdominal pain.  Patient has a history of cyclic vomiting syndrome.  States that she typically takes Phenergan and this will stop her emesis.  Patient states that if she can catch it before actually vomiting with her Phenergan typically this does not progress.  She states that she is out of her Phenergan and has started with vomiting and diarrhea.  Patient states that it is not atypical to have abdominal pain but states that she has a bulge sensation in her abdominal wall as well as having some left-sided abdominal pain.  No hematic emesis or dark tarry stool.  No fevers or chills.     Physical Exam   Triage Vital Signs: ED Triage Vitals  Encounter Vitals Group     BP 01/18/23 1619 (!) 158/105     Systolic BP Percentile --      Diastolic BP Percentile --      Pulse Rate 01/18/23 1619 87     Resp 01/18/23 1619 20     Temp 01/18/23 1619 98 F (36.7 C)     Temp Source 01/18/23 1619 Oral     SpO2 01/18/23 1619 98 %     Weight --      Height --      Head Circumference --      Peak Flow --      Pain Score 01/18/23 1617 4     Pain Loc --      Pain Education --      Exclude from Growth Chart --     Most recent vital signs: Vitals:   01/18/23 1619 01/18/23 2005  BP: (!) 158/105 (!) 154/87  Pulse: 87 73  Resp: 20 17  Temp: 98 F (36.7 C) 98.6 F (37 C)  SpO2: 98% 99%     General: Alert and in no acute distress.  Cardiovascular:  Good peripheral perfusion Respiratory: Normal respiratory effort without tachypnea or retractions. Lungs CTAB. Good air entry to the bases with no decreased or absent breath sounds. Gastrointestinal: Bowel sounds 4 quadrants. Soft and nontender to palpation. No guarding or rigidity. No palpable masses. No distention.  No CVA tenderness. Musculoskeletal: Full range of motion to all extremities.  Neurologic:  No gross focal neurologic deficits are appreciated.  Skin:   No rash noted Other:   ED Results / Procedures / Treatments   Labs (all labs ordered are listed, but only abnormal results are displayed) Labs Reviewed  COMPREHENSIVE METABOLIC PANEL - Abnormal; Notable for the following components:      Result Value   Glucose, Bld 148 (*)    All other components within normal limits  CBC WITH DIFFERENTIAL/PLATELET - Abnormal; Notable for the following components:   Neutro Abs 7.8 (*)    All other components within normal limits  URINALYSIS, ROUTINE W REFLEX MICROSCOPIC - Abnormal; Notable for the following components:   Color, Urine YELLOW (*)    APPearance CLOUDY (*)    Protein, ur 30 (*)    Bacteria, UA MANY (*)    All other components within normal limits  LIPASE, BLOOD  POC URINE PREG, ED     EKG     RADIOLOGY  I personally viewed, evaluated, and interpreted these images as part of my medical decision making, as  well as reviewing the written report by the radiologist.  ED Provider Interpretation: CT scan reveals left sided ovarian cyst, patient states that she knows about the cyst.  Patient also has a fat-containing ventral hernia consistent with patient's reported bulge.  No evidence of strangulation.  CT ABDOMEN PELVIS WO CONTRAST  Result Date: 01/18/2023 CLINICAL DATA:  Lower abdominal pain, vomiting, and diarrhea EXAM: CT ABDOMEN AND PELVIS WITHOUT CONTRAST TECHNIQUE: Multidetector CT imaging of the abdomen and pelvis was performed following the standard protocol without IV contrast. RADIATION DOSE REDUCTION: This exam was performed according to the departmental dose-optimization program which includes automated exposure control, adjustment of the mA and/or kV according to patient size and/or use of iterative reconstruction technique. COMPARISON:  CT abdomen and pelvis dated  04/21/2021 FINDINGS: Lower chest: No focal consolidation or pulmonary nodule in the lung bases. No pleural effusion or pneumothorax demonstrated. Partially imaged heart size is normal. Hepatobiliary: No focal hepatic lesions. No intra or extrahepatic biliary ductal dilation. Normal gallbladder. Pancreas: No focal lesions or main ductal dilation. Spleen: Normal in size without focal abnormality. Adrenals/Urinary Tract: No adrenal nodules. No suspicious renal mass on this noncontrast enhanced examination , calculi or hydronephrosis. No focal bladder wall thickening. Stomach/Bowel: Normal appearance of the stomach. No evidence of bowel wall thickening, distention, or inflammatory changes. Normal appendix. Vascular/Lymphatic: No significant vascular findings are present. No enlarged abdominal or pelvic lymph nodes. Reproductive: 3.0 cm simple-appearing left adnexal cyst. No right adnexal mass. Other: No free fluid, fluid collection, or free air. Musculoskeletal: No acute or abnormal lytic or blastic osseous lesions. Degenerative changes at L5-S1. Small fat containing lower ventral midline abdominal hernia (2:65). IMPRESSION: 1. No acute abdominopelvic findings. 2. Simple-appearing left adnexal cyst measures 3 cm. Pelvic ultrasound examination can be considered if there is strong clinical suspicion for ovarian torsion. Otherwise, no specific follow-up imaging recommended. 3. Small fat containing lower ventral midline abdominal hernia. Electronically Signed   By: Agustin Cree M.D.   On: 01/18/2023 20:07    PROCEDURES:  Critical Care performed: No  Procedures   MEDICATIONS ORDERED IN ED: Medications  promethazine (PHENERGAN) injection 25 mg (25 mg Intramuscular Given 01/18/23 2009)     IMPRESSION / MDM / ASSESSMENT AND PLAN / ED COURSE  I reviewed the triage vital signs and the nursing notes.                                 Differential diagnosis includes, but is not limited to, cyclic vomiting, colitis,  enteritis, hernia, ovarian cyst, ectopic pregnancy, pregnancy   Patient's presentation is most consistent with acute presentation with potential threat to life or bodily function.   Patient's diagnosis is consistent with cyclic vomiting syndrome, ventral wall hernia, ovarian cyst.  Patient presents with ongoing symptoms of her cyclic vomiting.  In discussion, patient mentioned having some left-sided abdominal pain as well as a palpable mass along her abdominal wall.  Findings were consistent with a reducible hernia but given the multiple complaints in addition to her vomiting and diarrhea imaging and labs were obtained.  These are reassuring.  Patient will be prescribed Phenergan and Bentyl which she typically uses for her cyclic vomiting.  Concerning signs and symptoms regarding her hernia are discussed.  She may follow-up with general surgery if this causes issues or at this time no acute intervention is necessary.  She has a known ovarian cyst which is again visualized today.  No significant amount of pain and cyst is not larger than 4 cm so I will not pursue ultrasound at this time.  Concerning signs and symptoms and return precautions regarding this are discussed..  Patient is given ED precautions to return to the ED for any worsening or new symptoms.     FINAL CLINICAL IMPRESSION(S) / ED DIAGNOSES   Final diagnoses:  Cyclical vomiting  Ventral hernia without obstruction or gangrene  Cyst of left ovary     Rx / DC Orders   ED Discharge Orders          Ordered    promethazine (PHENERGAN) 25 MG tablet  Every 6 hours PRN        01/18/23 2117    dicyclomine (BENTYL) 10 MG capsule  3 times daily before meals & bedtime        01/18/23 2117             Note:  This document was prepared using Dragon voice recognition software and may include unintentional dictation errors.   Lanette Hampshire 01/18/23 2118    Willy Eddy, MD 01/19/23 [phone]

## 2023-01-19 DIAGNOSIS — R110 Nausea: Secondary | ICD-10-CM | POA: Diagnosis not present

## 2023-01-19 DIAGNOSIS — N911 Secondary amenorrhea: Secondary | ICD-10-CM | POA: Diagnosis not present

## 2023-01-19 DIAGNOSIS — F419 Anxiety disorder, unspecified: Secondary | ICD-10-CM | POA: Diagnosis not present

## 2023-01-19 DIAGNOSIS — M545 Low back pain, unspecified: Secondary | ICD-10-CM | POA: Diagnosis not present

## 2023-01-19 DIAGNOSIS — G2581 Restless legs syndrome: Secondary | ICD-10-CM | POA: Diagnosis not present

## 2023-01-19 DIAGNOSIS — Z79899 Other long term (current) drug therapy: Secondary | ICD-10-CM | POA: Diagnosis not present

## 2023-01-19 DIAGNOSIS — R030 Elevated blood-pressure reading, without diagnosis of hypertension: Secondary | ICD-10-CM | POA: Diagnosis not present

## 2023-01-19 DIAGNOSIS — R4586 Emotional lability: Secondary | ICD-10-CM | POA: Diagnosis not present

## 2023-01-19 DIAGNOSIS — Z6837 Body mass index (BMI) 37.0-37.9, adult: Secondary | ICD-10-CM | POA: Diagnosis not present

## 2023-01-19 DIAGNOSIS — F32A Depression, unspecified: Secondary | ICD-10-CM | POA: Diagnosis not present

## 2023-01-19 DIAGNOSIS — E6609 Other obesity due to excess calories: Secondary | ICD-10-CM | POA: Diagnosis not present

## 2023-01-26 DIAGNOSIS — Z1211 Encounter for screening for malignant neoplasm of colon: Secondary | ICD-10-CM | POA: Diagnosis not present

## 2023-01-26 DIAGNOSIS — R1115 Cyclical vomiting syndrome unrelated to migraine: Secondary | ICD-10-CM | POA: Diagnosis not present

## 2023-02-02 DIAGNOSIS — R1115 Cyclical vomiting syndrome unrelated to migraine: Secondary | ICD-10-CM | POA: Diagnosis not present

## 2023-02-06 DIAGNOSIS — K2270 Barrett's esophagus without dysplasia: Secondary | ICD-10-CM | POA: Diagnosis not present

## 2023-02-16 DIAGNOSIS — M62838 Other muscle spasm: Secondary | ICD-10-CM | POA: Diagnosis not present

## 2023-02-16 DIAGNOSIS — Z79899 Other long term (current) drug therapy: Secondary | ICD-10-CM | POA: Diagnosis not present

## 2023-02-16 DIAGNOSIS — E6609 Other obesity due to excess calories: Secondary | ICD-10-CM | POA: Diagnosis not present

## 2023-02-16 DIAGNOSIS — G2581 Restless legs syndrome: Secondary | ICD-10-CM | POA: Diagnosis not present

## 2023-02-16 DIAGNOSIS — Z6837 Body mass index (BMI) 37.0-37.9, adult: Secondary | ICD-10-CM | POA: Diagnosis not present

## 2023-02-16 DIAGNOSIS — M51379 Other intervertebral disc degeneration, lumbosacral region without mention of lumbar back pain or lower extremity pain: Secondary | ICD-10-CM | POA: Diagnosis not present

## 2023-02-16 DIAGNOSIS — N911 Secondary amenorrhea: Secondary | ICD-10-CM | POA: Diagnosis not present

## 2023-02-16 DIAGNOSIS — F32A Depression, unspecified: Secondary | ICD-10-CM | POA: Diagnosis not present

## 2023-02-16 DIAGNOSIS — R110 Nausea: Secondary | ICD-10-CM | POA: Diagnosis not present

## 2023-02-16 DIAGNOSIS — R4586 Emotional lability: Secondary | ICD-10-CM | POA: Diagnosis not present

## 2023-02-19 DIAGNOSIS — Z79899 Other long term (current) drug therapy: Secondary | ICD-10-CM | POA: Diagnosis not present

## 2023-03-11 DIAGNOSIS — R1115 Cyclical vomiting syndrome unrelated to migraine: Secondary | ICD-10-CM | POA: Diagnosis not present

## 2023-03-11 DIAGNOSIS — K2270 Barrett's esophagus without dysplasia: Secondary | ICD-10-CM | POA: Diagnosis not present

## 2023-03-16 DIAGNOSIS — M62838 Other muscle spasm: Secondary | ICD-10-CM | POA: Diagnosis not present

## 2023-03-16 DIAGNOSIS — E6609 Other obesity due to excess calories: Secondary | ICD-10-CM | POA: Diagnosis not present

## 2023-03-16 DIAGNOSIS — G2581 Restless legs syndrome: Secondary | ICD-10-CM | POA: Diagnosis not present

## 2023-03-16 DIAGNOSIS — Z79899 Other long term (current) drug therapy: Secondary | ICD-10-CM | POA: Diagnosis not present

## 2023-03-16 DIAGNOSIS — Z6837 Body mass index (BMI) 37.0-37.9, adult: Secondary | ICD-10-CM | POA: Diagnosis not present

## 2023-03-16 DIAGNOSIS — Z3200 Encounter for pregnancy test, result unknown: Secondary | ICD-10-CM | POA: Diagnosis not present

## 2023-03-16 DIAGNOSIS — M545 Low back pain, unspecified: Secondary | ICD-10-CM | POA: Diagnosis not present

## 2023-03-17 ENCOUNTER — Other Ambulatory Visit (HOSPITAL_COMMUNITY): Payer: Self-pay | Admitting: Gastroenterology

## 2023-03-17 DIAGNOSIS — R1115 Cyclical vomiting syndrome unrelated to migraine: Secondary | ICD-10-CM

## 2023-03-18 DIAGNOSIS — Z79899 Other long term (current) drug therapy: Secondary | ICD-10-CM | POA: Diagnosis not present

## 2023-03-30 ENCOUNTER — Encounter (HOSPITAL_COMMUNITY)
Admission: RE | Admit: 2023-03-30 | Discharge: 2023-03-30 | Disposition: A | Discharge status: Home / Self Care | Payer: Medicaid Other | Source: Ambulatory Visit | Attending: Gastroenterology | Admitting: Gastroenterology

## 2023-03-30 DIAGNOSIS — R1110 Vomiting, unspecified: Secondary | ICD-10-CM | POA: Diagnosis not present

## 2023-03-30 DIAGNOSIS — R1115 Cyclical vomiting syndrome unrelated to migraine: Secondary | ICD-10-CM | POA: Insufficient documentation

## 2023-03-30 DIAGNOSIS — R109 Unspecified abdominal pain: Secondary | ICD-10-CM | POA: Diagnosis not present

## 2023-03-30 MED ORDER — TECHNETIUM TC 99M MEBROFENIN IV KIT
5.4000 | PACK | Freq: Once | INTRAVENOUS | Status: AC
  Administered 2023-03-30: 5.4 via INTRAVENOUS

## 2023-04-13 DIAGNOSIS — Z79899 Other long term (current) drug therapy: Secondary | ICD-10-CM | POA: Diagnosis not present

## 2023-04-13 DIAGNOSIS — Z3200 Encounter for pregnancy test, result unknown: Secondary | ICD-10-CM | POA: Diagnosis not present

## 2023-04-13 DIAGNOSIS — Z6836 Body mass index (BMI) 36.0-36.9, adult: Secondary | ICD-10-CM | POA: Diagnosis not present

## 2023-04-13 DIAGNOSIS — R110 Nausea: Secondary | ICD-10-CM | POA: Diagnosis not present

## 2023-04-13 DIAGNOSIS — R030 Elevated blood-pressure reading, without diagnosis of hypertension: Secondary | ICD-10-CM | POA: Diagnosis not present

## 2023-04-13 DIAGNOSIS — R4586 Emotional lability: Secondary | ICD-10-CM | POA: Diagnosis not present

## 2023-04-13 DIAGNOSIS — G2581 Restless legs syndrome: Secondary | ICD-10-CM | POA: Diagnosis not present

## 2023-04-13 DIAGNOSIS — M62838 Other muscle spasm: Secondary | ICD-10-CM | POA: Diagnosis not present

## 2023-04-13 DIAGNOSIS — F419 Anxiety disorder, unspecified: Secondary | ICD-10-CM | POA: Diagnosis not present

## 2023-04-13 DIAGNOSIS — M51379 Other intervertebral disc degeneration, lumbosacral region without mention of lumbar back pain or lower extremity pain: Secondary | ICD-10-CM | POA: Diagnosis not present

## 2023-04-13 DIAGNOSIS — F3160 Bipolar disorder, current episode mixed, unspecified: Secondary | ICD-10-CM | POA: Diagnosis not present

## 2023-04-16 DIAGNOSIS — Z79899 Other long term (current) drug therapy: Secondary | ICD-10-CM | POA: Diagnosis not present

## 2023-05-06 DIAGNOSIS — R1115 Cyclical vomiting syndrome unrelated to migraine: Secondary | ICD-10-CM | POA: Diagnosis not present

## 2023-05-06 DIAGNOSIS — F419 Anxiety disorder, unspecified: Secondary | ICD-10-CM | POA: Diagnosis not present

## 2023-05-06 DIAGNOSIS — K582 Mixed irritable bowel syndrome: Secondary | ICD-10-CM | POA: Diagnosis not present

## 2023-05-06 DIAGNOSIS — F32A Depression, unspecified: Secondary | ICD-10-CM | POA: Diagnosis not present

## 2023-05-11 DIAGNOSIS — M62838 Other muscle spasm: Secondary | ICD-10-CM | POA: Diagnosis not present

## 2023-05-11 DIAGNOSIS — F419 Anxiety disorder, unspecified: Secondary | ICD-10-CM | POA: Diagnosis not present

## 2023-05-11 DIAGNOSIS — M51379 Other intervertebral disc degeneration, lumbosacral region without mention of lumbar back pain or lower extremity pain: Secondary | ICD-10-CM | POA: Diagnosis not present

## 2023-05-11 DIAGNOSIS — Z3200 Encounter for pregnancy test, result unknown: Secondary | ICD-10-CM | POA: Diagnosis not present

## 2023-05-11 DIAGNOSIS — F3160 Bipolar disorder, current episode mixed, unspecified: Secondary | ICD-10-CM | POA: Diagnosis not present

## 2023-05-11 DIAGNOSIS — Z79899 Other long term (current) drug therapy: Secondary | ICD-10-CM | POA: Diagnosis not present

## 2023-05-11 DIAGNOSIS — R110 Nausea: Secondary | ICD-10-CM | POA: Diagnosis not present

## 2023-05-11 DIAGNOSIS — E6609 Other obesity due to excess calories: Secondary | ICD-10-CM | POA: Diagnosis not present

## 2023-05-11 DIAGNOSIS — G2581 Restless legs syndrome: Secondary | ICD-10-CM | POA: Diagnosis not present

## 2023-05-15 DIAGNOSIS — Z79899 Other long term (current) drug therapy: Secondary | ICD-10-CM | POA: Diagnosis not present

## 2023-07-10 ENCOUNTER — Emergency Department
Admission: EM | Admit: 2023-07-10 | Discharge: 2023-07-10 | Disposition: A | Discharge status: Home / Self Care | Attending: Emergency Medicine | Admitting: Emergency Medicine

## 2023-07-10 ENCOUNTER — Encounter: Payer: Self-pay | Admitting: Emergency Medicine

## 2023-07-10 ENCOUNTER — Other Ambulatory Visit: Payer: Self-pay

## 2023-07-10 DIAGNOSIS — R1084 Generalized abdominal pain: Secondary | ICD-10-CM | POA: Diagnosis not present

## 2023-07-10 DIAGNOSIS — R9431 Abnormal electrocardiogram [ECG] [EKG]: Secondary | ICD-10-CM | POA: Diagnosis not present

## 2023-07-10 DIAGNOSIS — R112 Nausea with vomiting, unspecified: Secondary | ICD-10-CM | POA: Diagnosis present

## 2023-07-10 DIAGNOSIS — R1115 Cyclical vomiting syndrome unrelated to migraine: Secondary | ICD-10-CM | POA: Diagnosis not present

## 2023-07-10 LAB — URINALYSIS, ROUTINE W REFLEX MICROSCOPIC
Bilirubin Urine: NEGATIVE
Glucose, UA: 50 mg/dL — AB
Hgb urine dipstick: NEGATIVE
Ketones, ur: NEGATIVE mg/dL
Leukocytes,Ua: NEGATIVE
Nitrite: NEGATIVE
Protein, ur: 30 mg/dL — AB
Specific Gravity, Urine: 1.013 (ref 1.005–1.030)
pH: 9 — ABNORMAL HIGH (ref 5.0–8.0)

## 2023-07-10 LAB — CBC WITH DIFFERENTIAL/PLATELET
Abs Immature Granulocytes: 0.03 10*3/uL (ref 0.00–0.07)
Basophils Absolute: 0 10*3/uL (ref 0.0–0.1)
Basophils Relative: 0 %
Eosinophils Absolute: 0 10*3/uL (ref 0.0–0.5)
Eosinophils Relative: 0 %
HCT: 40.8 % (ref 36.0–46.0)
Hemoglobin: 13.6 g/dL (ref 12.0–15.0)
Immature Granulocytes: 0 %
Lymphocytes Relative: 13 %
Lymphs Abs: 1.2 10*3/uL (ref 0.7–4.0)
MCH: 31.1 pg (ref 26.0–34.0)
MCHC: 33.3 g/dL (ref 30.0–36.0)
MCV: 93.2 fL (ref 80.0–100.0)
Monocytes Absolute: 0.5 10*3/uL (ref 0.1–1.0)
Monocytes Relative: 5 %
Neutro Abs: 7.9 10*3/uL — ABNORMAL HIGH (ref 1.7–7.7)
Neutrophils Relative %: 82 %
Platelets: 214 10*3/uL (ref 150–400)
RBC: 4.38 MIL/uL (ref 3.87–5.11)
RDW: 12.8 % (ref 11.5–15.5)
WBC: 9.6 10*3/uL (ref 4.0–10.5)
nRBC: 0 % (ref 0.0–0.2)

## 2023-07-10 LAB — COMPREHENSIVE METABOLIC PANEL WITH GFR
ALT: 29 U/L (ref 0–44)
AST: 33 U/L (ref 15–41)
Albumin: 4.3 g/dL (ref 3.5–5.0)
Alkaline Phosphatase: 83 U/L (ref 38–126)
Anion gap: 14 (ref 5–15)
BUN: 6 mg/dL (ref 6–20)
CO2: 22 mmol/L (ref 22–32)
Calcium: 9.3 mg/dL (ref 8.9–10.3)
Chloride: 104 mmol/L (ref 98–111)
Creatinine, Ser: 0.77 mg/dL (ref 0.44–1.00)
GFR, Estimated: >60 mL/min (ref >60)
Glucose, Bld: 166 mg/dL — ABNORMAL HIGH (ref 70–99)
Potassium: 3.7 mmol/L (ref 3.5–5.1)
Sodium: 140 mmol/L (ref 135–145)
Total Bilirubin: 0.6 mg/dL (ref 0.0–1.2)
Total Protein: 7.1 g/dL (ref 6.5–8.1)

## 2023-07-10 LAB — PREGNANCY, URINE: Preg Test, Ur: NEGATIVE

## 2023-07-10 LAB — LIPASE, BLOOD: Lipase: 26 U/L (ref 11–51)

## 2023-07-10 MED ORDER — SODIUM CHLORIDE 0.9 % IV BOLUS (SEPSIS)
1000.0000 mL | Freq: Once | INTRAVENOUS | Status: AC
  Administered 2023-07-10: 1000 mL via INTRAVENOUS

## 2023-07-10 MED ORDER — PROMETHAZINE HCL 25 MG PO TABS: 25.0000 mg | ORAL_TABLET | Freq: Four times a day (QID) | ORAL | 0 refills | Status: AC | PRN

## 2023-07-10 MED ORDER — DROPERIDOL 2.5 MG/ML IJ SOLN
2.5000 mg | Freq: Once | INTRAMUSCULAR | Status: AC
  Administered 2023-07-10: 2.5 mg via INTRAVENOUS
  Filled 2023-07-10: qty 2

## 2023-07-10 MED ORDER — SODIUM CHLORIDE 0.9 % IV SOLN
25.0000 mg | Freq: Once | INTRAVENOUS | Status: AC
  Administered 2023-07-10: 25 mg via INTRAVENOUS
  Filled 2023-07-10: qty 25

## 2023-07-10 NOTE — ED Provider Notes (Addendum)
 [facility] Provider Note    Event Date/Time   First MD Initiated Contact with Patient 07/10/23 [phone]     (approximate)   History   Abdominal Pain   HPI  Holly Gordon is a 43 y.o. female with history of cyclic vomiting, prior C-section who presents to the emergency department with nausea, vomiting, diarrhea and generalized abdominal pain that started today.  Feels similar to her prior episodes of cyclic vomiting.  She does use marijuana regularly but states that her gastroenterologist does not think that this is cannabinoid hyperemesis syndrome.  No dysuria, hematuria, vaginal bleeding or discharge.  No fever.   History provided by patient, family.    Past Medical History:  Diagnosis Date   Anemia    Anxiety    Bacterial vaginitis 08/26/2016   Depression    had counseling in the passt no meds   History of abnormal cervical Pap smear     Past Surgical History:  Procedure Laterality Date   CESAREAN SECTION  2008   x1   CESAREAN SECTION N/A 04/03/2017   Procedure: CESAREAN SECTION;  Surgeon: Othelia Blinks, MD;  Location: [facility] BIRTHING SUITES;  Service: Obstetrics;  Laterality: N/A;   COLPOSCOPY     abnormal pap   INDUCED ABORTION     TONSILLECTOMY     WISDOM TOOTH EXTRACTION     x4    MEDICATIONS:  Prior to Admission medications   Medication Sig Start Date End Date Taking? Authorizing Provider  acetaminophen  (TYLENOL ) 500 MG tablet Take 1,000 mg by mouth every 6 (six) hours as needed for mild pain.    [provider]  busPIRone (BUSPAR) 15 MG tablet Take 15 mg by mouth 3 (three) times daily. 09/24/20   [provider]  carbamazepine (CARBATROL) 200 MG 12 hr capsule Take 200 mg by mouth 2 (two) times daily. 03/21/21   [provider]  clonazePAM (KLONOPIN) 0.5 MG tablet Take 0.5 mg by mouth daily as needed for anxiety. 03/26/20   [provider]  dicyclomine  (BENTYL ) 10 MG capsule Take 1 capsule (10 mg  total) by mouth 4 (four) times daily -  before meals and at bedtime. 01/18/23   Cuthriell, Ardath Bears, PA-C  FLUoxetine (PROZAC) 40 MG capsule Take 40 mg by mouth daily. 09/17/20   [provider]  ibuprofen  (ADVIL ) 200 MG tablet Take 800 mg by mouth every 6 (six) hours as needed for mild pain.    [provider]  lamoTRIgine (LAMICTAL) 200 MG tablet Take 200 mg by mouth daily. 04/06/21   [provider]  levocetirizine (XYZAL) 5 MG tablet Take 5 mg by mouth at bedtime. 03/25/21   [provider]  omeprazole  (PRILOSEC) 20 MG capsule Take 20 mg by mouth daily. 03/25/21   [provider]  omeprazole  (PRILOSEC) 20 MG capsule Take 1 capsule (20 mg total) by mouth daily. 04/21/21   Horton, Vonzella Guernsey, MD  ondansetron  (ZOFRAN -ODT) 4 MG disintegrating tablet Take 1 tablet (4 mg total) by mouth every 6 (six) hours as needed for nausea or vomiting. 06/13/21   Iver Marker, MD  promethazine  (PHENERGAN ) 25 MG tablet Take 1 tablet (25 mg total) by mouth every 6 (six) hours as needed for nausea or vomiting. 01/18/23   Cuthriell, Ardath Bears, PA-C  sertraline  (ZOLOFT ) 100 MG tablet Take 1 tablet (100 mg total) by mouth daily. In 1 week increase to 150 (1.5 pills) daily. Patient not taking: Reported on 06/22/2020 06/29/19  Abner Ables, MD  tranexamic acid  (LYSTEDA ) 650 MG TABS tablet Take 2 tablets (1,300 mg total) by mouth 3 (three) times daily. 11/14/21   Granville Layer, MD  VENTOLIN HFA 108 (90 Base) MCG/ACT inhaler Inhale 1-2 puffs into the lungs 4 (four) times daily as needed for wheezing or shortness of breath. 03/11/21   [provider]  Vitamin D, Ergocalciferol, (DRISDOL) 1.25 MG (50000 UNIT) CAPS capsule Take 50,000 Units by mouth every Monday. 03/25/21   [provider]    Physical Exam   Triage Vital Signs: ED Triage Vitals  Encounter Vitals Group     BP 07/10/23 0505 (!) 120/102     Systolic BP Percentile --      Diastolic BP Percentile  --      Pulse Rate 07/10/23 0505 87     Resp --      Temp 07/10/23 0505 97.9 F (36.6 C)     Temp Source 07/10/23 0505 Oral     SpO2 07/10/23 0505 100 %     Weight 07/10/23 0500 210 lb (95.3 kg)     Height 07/10/23 0500 5\' 4"  (1.626 m)     Head Circumference --      Peak Flow --      Pain Score 07/10/23 0500 7     Pain Loc --      Pain Education --      Exclude from Growth Chart --     Most recent vital signs: Vitals:   07/10/23 0505 07/10/23 0649  BP: (!) 120/102 (!) 169/89  Pulse: 87 69  Resp:  (!) 22  Temp: 97.9 F (36.6 C)   SpO2: 100% 96%    CONSTITUTIONAL: Alert, responds appropriately to questions.  Chronically ill-appearing HEAD: Normocephalic, atraumatic EYES: Conjunctivae clear, pupils appear equal, sclera nonicteric ENT: normal nose; moist mucous membranes NECK: Supple, normal ROM CARD: RRR; S1 and S2 appreciated RESP: Normal chest excursion without splinting or tachypnea; breath sounds clear and equal bilaterally; no wheezes, no rhonchi, no rales, no hypoxia or respiratory distress, speaking full sentences ABD/GI: Non-distended; soft, non-tender, no rebound, no guarding, no peritoneal signs BACK: The back appears normal EXT: Normal ROM in all joints; no deformity noted, no edema SKIN: Normal color for age and race; warm; no rash on exposed skin NEURO: Moves all extremities equally, normal speech PSYCH: The patient's mood and manner are appropriate.   ED Results / Procedures / Treatments   LABS: (all labs ordered are listed, but only abnormal results are displayed) Labs Reviewed  CBC WITH DIFFERENTIAL/PLATELET - Abnormal; Notable for the following components:      Result Value   Neutro Abs 7.9 (*)    All other components within normal limits  COMPREHENSIVE METABOLIC PANEL WITH GFR - Abnormal; Notable for the following components:   Glucose, Bld 166 (*)    All other components within normal limits  URINALYSIS, ROUTINE W REFLEX MICROSCOPIC - Abnormal;  Notable for the following components:   Color, Urine YELLOW (*)    APPearance CLEAR (*)    pH 9.0 (*)    Glucose, UA 50 (*)    Protein, ur 30 (*)    Bacteria, UA RARE (*)    All other components within normal limits  LIPASE, BLOOD  PREGNANCY, URINE     EKG:   EKG Interpretation Date/Time:  Friday Jul 10 2023 07:44:45 EDT Ventricular Rate:  70 PR Interval:    QRS Duration:  82 QT Interval:  438 QTC  Calculation: 473 R Axis:   0  Text Interpretation: Normal sinus rhythm Probable lateral infarct, age indeterminate Confirmed by Verneda Golder [phone]) on 07/10/2023 7:52:38 AM        RADIOLOGY: My personal review and interpretation of imaging:    I have personally reviewed all radiology reports.   No results found.   PROCEDURES:  Critical Care performed: No     Procedures    IMPRESSION / MDM / ASSESSMENT AND PLAN / ED COURSE  I reviewed the triage vital signs and the nursing notes.    Patient here with vomiting, diarrhea and generalized abdominal pain.  History of cyclic vomiting.  States this feels similar to her prior episodes.    DIFFERENTIAL DIAGNOSIS (includes but not limited to):   Cyclic vomiting syndrome, cannabinoid hyperemesis syndrome, gastroparesis, doubt appendicitis, bowel obstruction, colitis, diverticulitis, perforation, pancreatitis, cholecystitis based on benign exam   Patient's presentation is most consistent with acute presentation with potential threat to life or bodily function.   PLAN: Labs, urine obtained from triage.  Normal hemoglobin.  Normal electrolytes.  LFTs and lipase unremarkable.  Urine shows no sign of infection and no ketones.  Pregnancy test is negative.  It appears IV fluids and IV Phenergan  have helped her with the previous episodes.  Will give both here in the emergency department and reassess.   MEDICATIONS GIVEN IN ED: Medications  sodium chloride  0.9 % bolus 1,000 mL (0 mLs Intravenous Stopping previously hung  infusion 07/10/23 0746)  promethazine  (PHENERGAN ) 25 mg in sodium chloride  0.9 % 50 mL IVPB (0 mg Intravenous Stopped 07/10/23 0746)  droperidol (INAPSINE) 2.5 MG/ML injection 2.5 mg (2.5 mg Intravenous Given 07/10/23 0747)     ED COURSE: Patient still vomiting after initiation of Phenergan .  Complaining of lower abdominal pain.  Will give IV droperidol.  Signed out the oncoming ED team to reassess patient.  If she is feeling better, tolerating p.o., anticipate discharge home with outpatient follow-up.   CONSULTS: Pending further workup.   OUTSIDE RECORDS REVIEWED: Reviewed last OB/GYN note in 2023.       FINAL CLINICAL IMPRESSION(S) / ED DIAGNOSES   Final diagnoses:  Cyclical vomiting     Rx / DC Orders   ED Discharge Orders          Ordered    promethazine  (PHENERGAN ) 25 MG tablet  Every 6 hours PRN        07/10/23 0753             Note:  This document was prepared using Dragon voice recognition software and may include unintentional dictation errors.   Dorlisa Savino, Clover Dao, DO 07/10/23 0722    Lamichael Youkhana, Clover Dao, DO 07/10/23 [phone]

## 2023-07-10 NOTE — ED Triage Notes (Signed)
 Pt to triage via w/c with no distress noted; reports hx cyclical vomiting; c/o N/V and generalized abd pain since last night

## 2023-07-10 NOTE — ED Notes (Signed)
 Pt provided a specimen cup & instructed to obtain urine sample. Pt states she is unable to urinate at this time.

## 2023-07-10 NOTE — ED Notes (Signed)
 D/C and reasons to return discussed with pt, pt refused DC vitals due to urgency of needing to leave. S/O with pt, NAD noted. Pt ambulatory on D/C.

## 2023-07-10 NOTE — ED Provider Notes (Signed)
-----------------------------------------   9:50 AM on 07/10/2023 ----------------------------------------- Patient states she is feeling better.  He is requesting to be discharged from the emergency department.  Will discharge with a prescription for Phenergan .   Ruth Cove, MD 07/10/23 [phone]

## 2023-08-03 DIAGNOSIS — K760 Fatty (change of) liver, not elsewhere classified: Secondary | ICD-10-CM | POA: Diagnosis not present

## 2023-08-03 DIAGNOSIS — Z6836 Body mass index (BMI) 36.0-36.9, adult: Secondary | ICD-10-CM | POA: Diagnosis not present

## 2023-08-18 DIAGNOSIS — M5431 Sciatica, right side: Secondary | ICD-10-CM | POA: Diagnosis not present

## 2023-08-18 DIAGNOSIS — Z79899 Other long term (current) drug therapy: Secondary | ICD-10-CM | POA: Diagnosis not present

## 2023-08-18 DIAGNOSIS — F419 Anxiety disorder, unspecified: Secondary | ICD-10-CM | POA: Diagnosis not present

## 2023-08-18 DIAGNOSIS — G2581 Restless legs syndrome: Secondary | ICD-10-CM | POA: Diagnosis not present

## 2023-08-18 DIAGNOSIS — E7800 Pure hypercholesterolemia, unspecified: Secondary | ICD-10-CM | POA: Diagnosis not present

## 2023-08-18 DIAGNOSIS — Z3200 Encounter for pregnancy test, result unknown: Secondary | ICD-10-CM | POA: Diagnosis not present

## 2023-08-18 DIAGNOSIS — F17210 Nicotine dependence, cigarettes, uncomplicated: Secondary | ICD-10-CM | POA: Diagnosis not present

## 2023-08-18 DIAGNOSIS — M62838 Other muscle spasm: Secondary | ICD-10-CM | POA: Diagnosis not present

## 2023-08-18 DIAGNOSIS — R110 Nausea: Secondary | ICD-10-CM | POA: Diagnosis not present

## 2023-08-18 DIAGNOSIS — F3160 Bipolar disorder, current episode mixed, unspecified: Secondary | ICD-10-CM | POA: Diagnosis not present

## 2023-08-18 DIAGNOSIS — E6609 Other obesity due to excess calories: Secondary | ICD-10-CM | POA: Diagnosis not present

## 2023-08-25 DIAGNOSIS — Z79899 Other long term (current) drug therapy: Secondary | ICD-10-CM | POA: Diagnosis not present

## 2023-09-22 DIAGNOSIS — Z79899 Other long term (current) drug therapy: Secondary | ICD-10-CM | POA: Diagnosis not present

## 2023-09-22 DIAGNOSIS — R5383 Other fatigue: Secondary | ICD-10-CM | POA: Diagnosis not present

## 2023-09-22 DIAGNOSIS — F17210 Nicotine dependence, cigarettes, uncomplicated: Secondary | ICD-10-CM | POA: Diagnosis not present

## 2023-09-22 DIAGNOSIS — D539 Nutritional anemia, unspecified: Secondary | ICD-10-CM | POA: Diagnosis not present

## 2023-09-22 DIAGNOSIS — M62838 Other muscle spasm: Secondary | ICD-10-CM | POA: Diagnosis not present

## 2023-09-22 DIAGNOSIS — N914 Secondary oligomenorrhea: Secondary | ICD-10-CM | POA: Diagnosis not present

## 2023-09-22 DIAGNOSIS — F3160 Bipolar disorder, current episode mixed, unspecified: Secondary | ICD-10-CM | POA: Diagnosis not present

## 2023-09-22 DIAGNOSIS — R0602 Shortness of breath: Secondary | ICD-10-CM | POA: Diagnosis not present

## 2023-09-22 DIAGNOSIS — E88819 Insulin resistance, unspecified: Secondary | ICD-10-CM | POA: Diagnosis not present

## 2023-09-22 DIAGNOSIS — E7800 Pure hypercholesterolemia, unspecified: Secondary | ICD-10-CM | POA: Diagnosis not present

## 2023-09-22 DIAGNOSIS — M51379 Other intervertebral disc degeneration, lumbosacral region without mention of lumbar back pain or lower extremity pain: Secondary | ICD-10-CM | POA: Diagnosis not present

## 2023-09-22 DIAGNOSIS — E559 Vitamin D deficiency, unspecified: Secondary | ICD-10-CM | POA: Diagnosis not present

## 2023-10-02 DIAGNOSIS — Z79899 Other long term (current) drug therapy: Secondary | ICD-10-CM | POA: Diagnosis not present

## 2023-10-03 IMAGING — US US PELVIS COMPLETE WITH TRANSVAGINAL
2 series · 13 of 25 positions shown · non-contrast
Comparison: None

CLINICAL DATA: Abnormal uterine bleeding, LMP 06/29/2021

EXAM:
TRANSABDOMINAL AND TRANSVAGINAL ULTRASOUND OF PELVIS
TECHNIQUE: Both transabdominal and transvaginal ultrasound examinations of the
pelvis were performed. Transabdominal technique was performed for
global imaging of the pelvis including uterus, ovaries, adnexal
regions, and pelvic cul-de-sac. It was necessary to proceed with
endovaginal exam following the transabdominal exam to visualize the
endometrium and ovaries.

[Series 1: us pelvic complete with transvaginal · 92 acquisitions, 12 frames shown (1 of 2)]
[im 1/92]
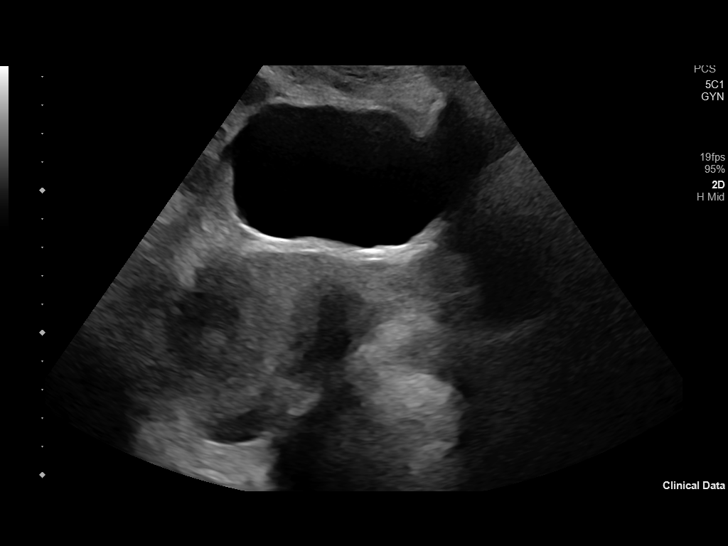
[im 8/92]
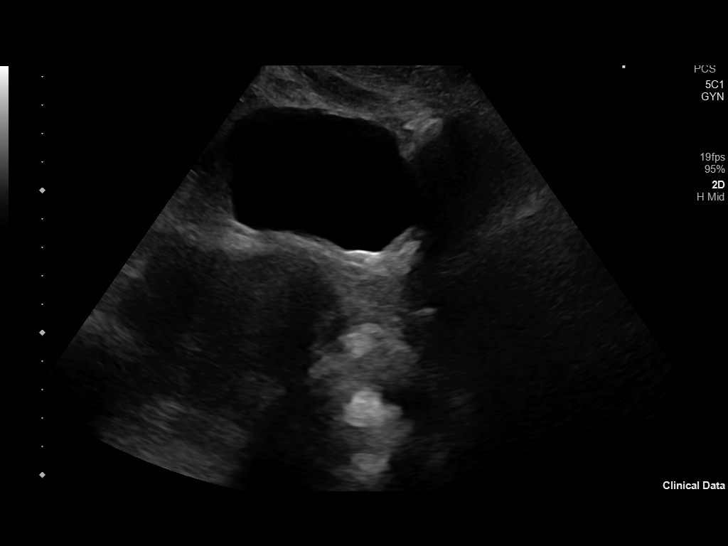
[im 16/92]
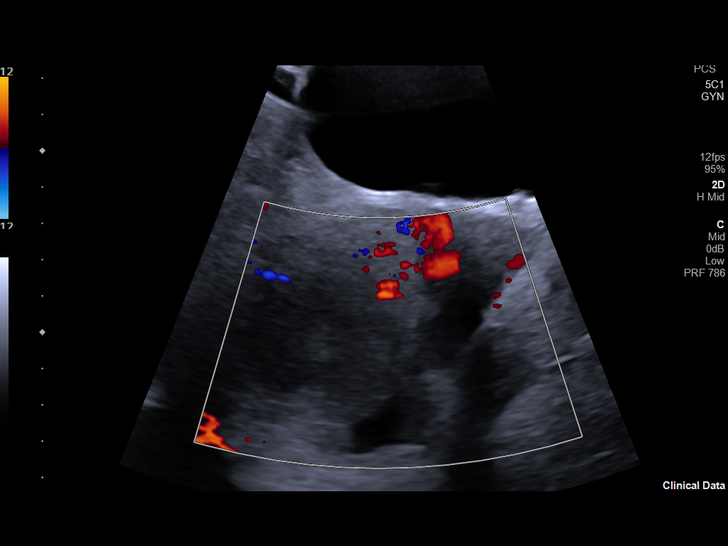
[im 24/92]
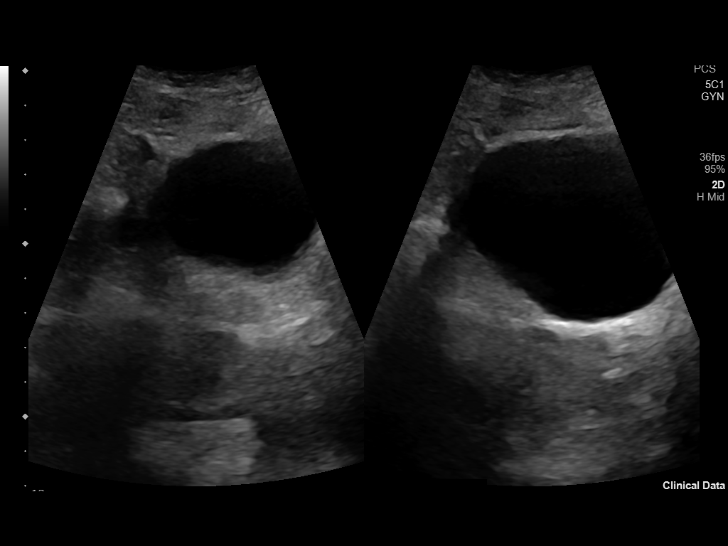
[im 32/92]
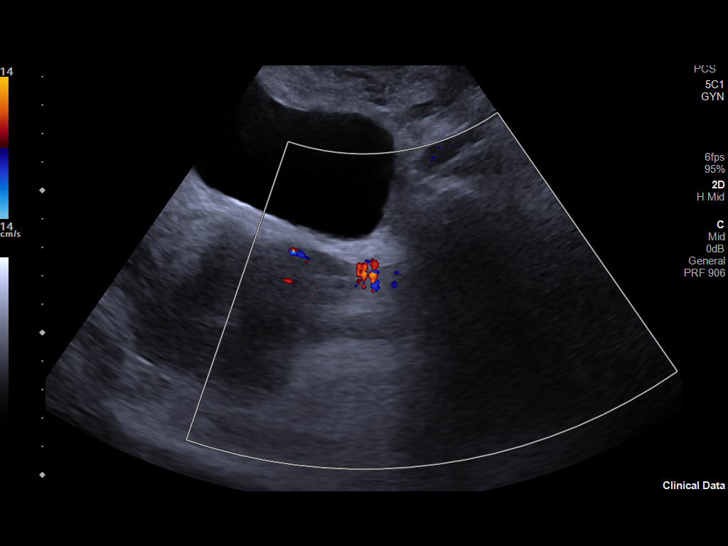
[im 40/92]
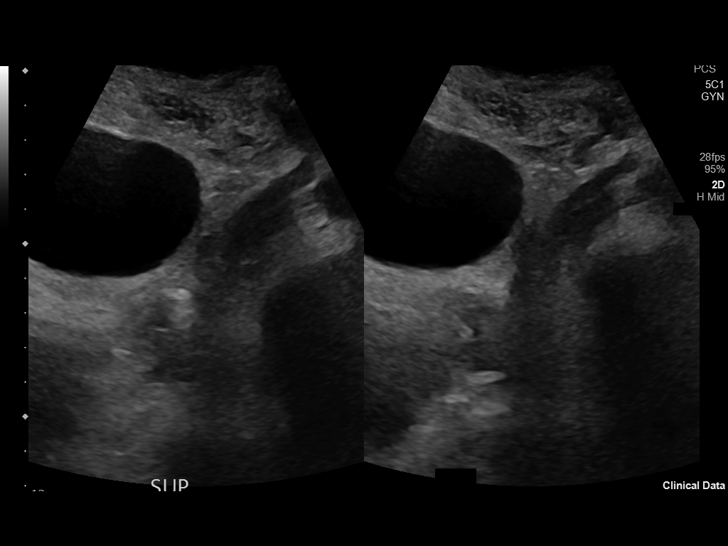
[im 48/92]
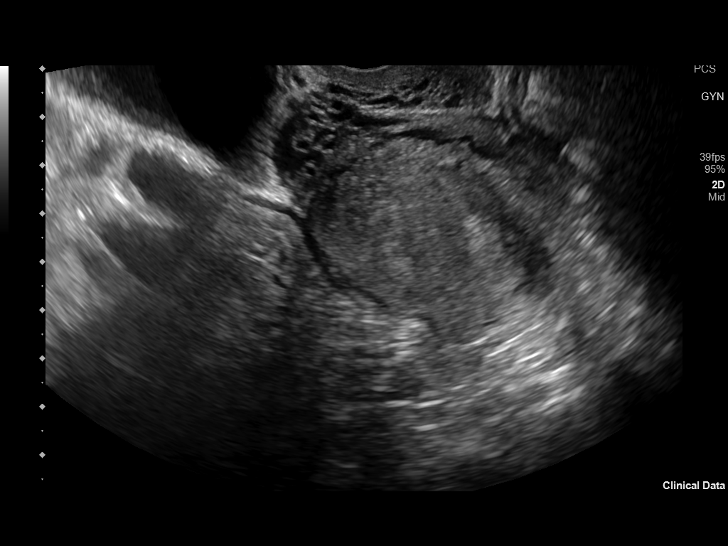
[im 56/92]
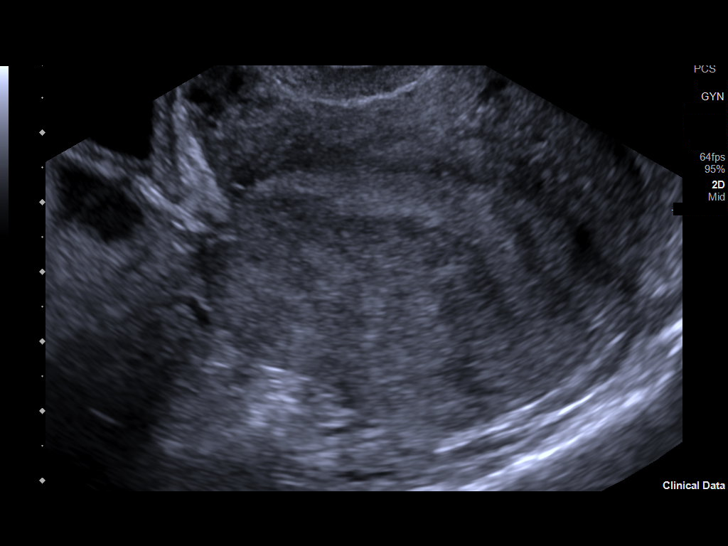
[im 64/92]
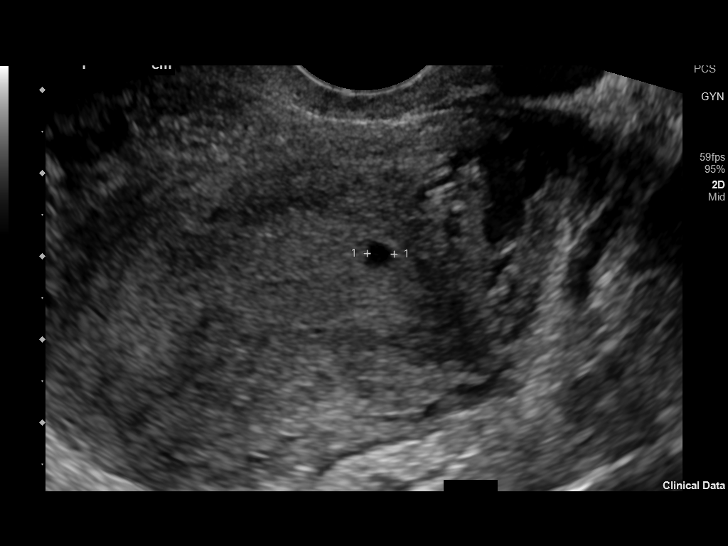
[im 72/92]
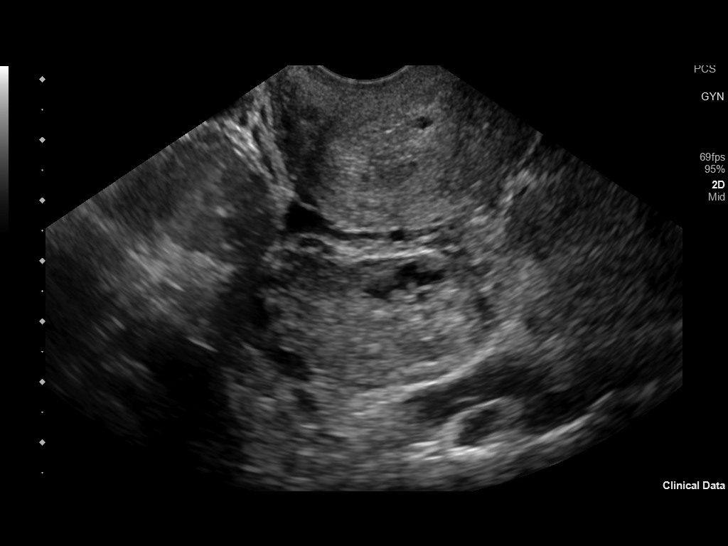
[im 80/92]
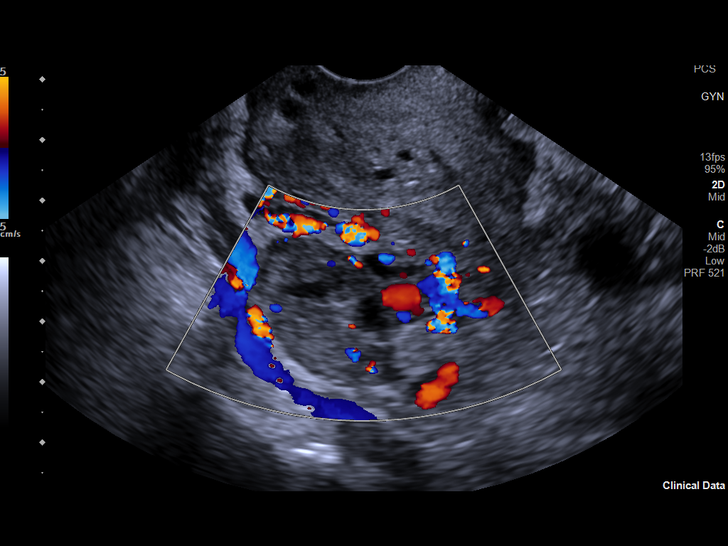
[im 88/92]
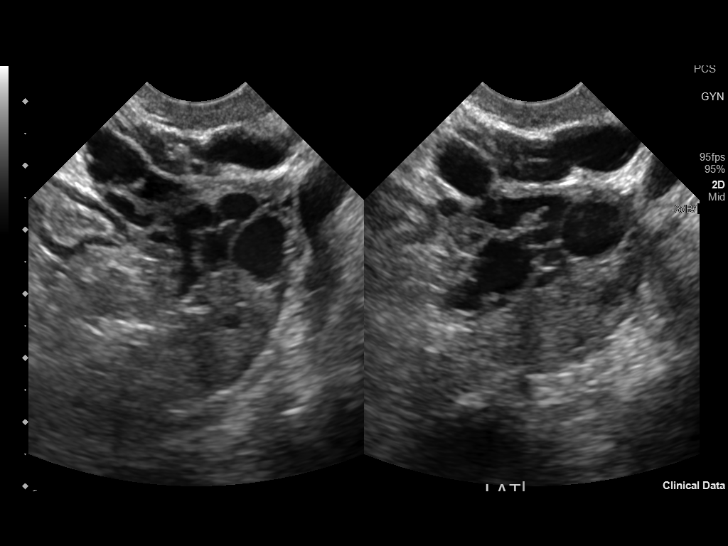

[Series 1001: us pelvic complete with transvaginal · 1 of 1 slices shown (2 of 2)]
[im 1/1]
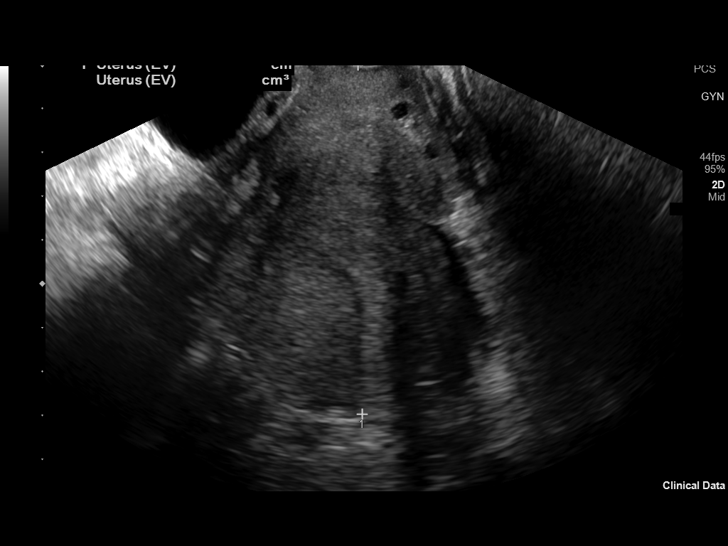

[13 of 25 positions shown; findings below may reference images not displayed]

FINDINGS: Uterus

Measurements: 8.0 x 4.5 x 6.4 cm = volume: 118 mL. Retroflexed.
Slightly heterogeneous myometrium. No focal mass.

Endometrium

Thickness: 11 mm. Small ovoid fluid collection identified within
endometrial complex at LEFT fundus 6 x 3 x 3 mm.

Right ovary

Measurements: 4.0 x 2.2 x 2.1 cm = volume: 9.3 mL. Normal morphology
without mass

Left ovary

Measurements: 3.1 x 1.4 x 1.5 cm = volume: 3.5 mL. Normal morphology
without mass

Other findings

Small amount of nonspecific free pelvic fluid.  No adnexal masses.
IMPRESSION: Small amount of nonspecific free pelvic fluid.

6 x 3 x 3 mm ovoid fluid collection at LEFT fundus, likely
represents nonspecific endometrial fluid but a small gestational sac
is not excluded; recommend correlation with pregnancy test.

No other pelvic sonographic abnormalities.

## 2023-11-06 DIAGNOSIS — F17210 Nicotine dependence, cigarettes, uncomplicated: Secondary | ICD-10-CM | POA: Diagnosis not present

## 2023-11-06 DIAGNOSIS — Z79899 Other long term (current) drug therapy: Secondary | ICD-10-CM | POA: Diagnosis not present

## 2023-11-06 DIAGNOSIS — G2581 Restless legs syndrome: Secondary | ICD-10-CM | POA: Diagnosis not present

## 2023-11-06 DIAGNOSIS — F3160 Bipolar disorder, current episode mixed, unspecified: Secondary | ICD-10-CM | POA: Diagnosis not present

## 2023-11-06 DIAGNOSIS — E559 Vitamin D deficiency, unspecified: Secondary | ICD-10-CM | POA: Diagnosis not present

## 2023-11-06 DIAGNOSIS — N914 Secondary oligomenorrhea: Secondary | ICD-10-CM | POA: Diagnosis not present

## 2023-11-06 DIAGNOSIS — M62838 Other muscle spasm: Secondary | ICD-10-CM | POA: Diagnosis not present

## 2023-11-06 DIAGNOSIS — R110 Nausea: Secondary | ICD-10-CM | POA: Diagnosis not present

## 2023-11-06 DIAGNOSIS — M51379 Other intervertebral disc degeneration, lumbosacral region without mention of lumbar back pain or lower extremity pain: Secondary | ICD-10-CM | POA: Diagnosis not present

## 2023-11-06 DIAGNOSIS — E7800 Pure hypercholesterolemia, unspecified: Secondary | ICD-10-CM | POA: Diagnosis not present

## 2023-11-10 DIAGNOSIS — Z79899 Other long term (current) drug therapy: Secondary | ICD-10-CM | POA: Diagnosis not present

## 2023-12-23 DIAGNOSIS — R0602 Shortness of breath: Secondary | ICD-10-CM | POA: Diagnosis not present

## 2023-12-23 DIAGNOSIS — J4540 Moderate persistent asthma, uncomplicated: Secondary | ICD-10-CM | POA: Diagnosis not present

## 2023-12-23 DIAGNOSIS — J309 Allergic rhinitis, unspecified: Secondary | ICD-10-CM | POA: Diagnosis not present

## 2023-12-23 DIAGNOSIS — F17210 Nicotine dependence, cigarettes, uncomplicated: Secondary | ICD-10-CM | POA: Diagnosis not present

## 2024-02-01 ENCOUNTER — Emergency Department
Admission: EM | Admit: 2024-02-01 | Discharge: 2024-02-01 | Disposition: A | Discharge status: Home / Self Care | Attending: Emergency Medicine | Admitting: Emergency Medicine

## 2024-02-01 DIAGNOSIS — R1115 Cyclical vomiting syndrome unrelated to migraine: Secondary | ICD-10-CM | POA: Insufficient documentation

## 2024-02-01 LAB — COMPREHENSIVE METABOLIC PANEL WITH GFR
ALT: 21 U/L (ref 0–44)
AST: 28 U/L (ref 15–41)
Albumin: 4.7 g/dL (ref 3.5–5.0)
Alkaline Phosphatase: 142 U/L — ABNORMAL HIGH (ref 38–126)
Anion gap: 14 (ref 5–15)
BUN: 6 mg/dL (ref 6–20)
CO2: 22 mmol/L (ref 22–32)
Calcium: 9.8 mg/dL (ref 8.9–10.3)
Chloride: 105 mmol/L (ref 98–111)
Creatinine, Ser: 0.76 mg/dL (ref 0.44–1.00)
GFR, Estimated: >60 mL/min (ref >60)
Glucose, Bld: 157 mg/dL — ABNORMAL HIGH (ref 70–99)
Potassium: 4 mmol/L (ref 3.5–5.1)
Sodium: 141 mmol/L (ref 135–145)
Total Bilirubin: 0.5 mg/dL (ref 0.0–1.2)
Total Protein: 7.7 g/dL (ref 6.5–8.1)

## 2024-02-01 LAB — URINALYSIS, ROUTINE W REFLEX MICROSCOPIC
Bacteria, UA: NONE SEEN
Bilirubin Urine: NEGATIVE
Glucose, UA: NEGATIVE mg/dL
Ketones, ur: 20 mg/dL — AB
Leukocytes,Ua: NEGATIVE
Nitrite: NEGATIVE
Protein, ur: 30 mg/dL — AB
Specific Gravity, Urine: 1.014 (ref 1.005–1.030)
pH: 9 — ABNORMAL HIGH (ref 5.0–8.0)

## 2024-02-01 LAB — CBC
HCT: 42.9 % (ref 36.0–46.0)
Hemoglobin: 14.1 g/dL (ref 12.0–15.0)
MCH: 29 pg (ref 26.0–34.0)
MCHC: 32.9 g/dL (ref 30.0–36.0)
MCV: 88.3 fL (ref 80.0–100.0)
Platelets: 317 K/uL (ref 150–400)
RBC: 4.86 MIL/uL (ref 3.87–5.11)
RDW: 14.7 % (ref 11.5–15.5)
WBC: 8.4 K/uL (ref 4.0–10.5)
nRBC: 0 % (ref 0.0–0.2)

## 2024-02-01 LAB — LIPASE, BLOOD: Lipase: 17 U/L (ref 11–51)

## 2024-02-01 LAB — PREGNANCY, URINE: Preg Test, Ur: NEGATIVE

## 2024-02-01 MED ORDER — SODIUM CHLORIDE 0.9 % IV SOLN
25.0000 mg | Freq: Four times a day (QID) | INTRAVENOUS | Status: DC | PRN
  Administered 2024-02-01: 25 mg via INTRAVENOUS
  Filled 2024-02-01: qty 1

## 2024-02-01 MED ORDER — SODIUM CHLORIDE 0.9 % IV BOLUS
1000.0000 mL | Freq: Once | INTRAVENOUS | Status: AC
  Administered 2024-02-01: 1000 mL via INTRAVENOUS

## 2024-02-01 NOTE — ED Provider Notes (Signed)
 [facility] Provider Note    Event Date/Time   First MD Initiated Contact with Patient 02/01/24 1651     (approximate)   History   Emesis   HPI  Holly Gordon is a 43 y.o. female with history of substance abuse, anemia, anxiety and as listed in EMR presents to the emergency department for treatment and evaluation of cyclical vomiting.  Vomiting started this morning.  She has lower abdominal pain.  No known fever.  Symptoms similar to previous.  Location of abdominal pain unchanged from previous. Symptoms start with lower abdominal pain then an episode of diarrhea and cold sweats followed by vomiting. She has had CBD, but has not smoked marijuana in the last few days.   Physical Exam    Vitals:   02/01/24 1558  BP: 135/84  Pulse: 83  Resp: 18  Temp: 98.3 F (36.8 C)  SpO2: 100%    General: Awake, no distress.  CV:  Good peripheral perfusion.  Resp:  Normal effort.  Abd:  No distention. Soft. Bowel sounds present x 4 quadrants Other:  Skin warm and dry.   ED Results / Procedures / Treatments   Labs (all labs ordered are listed, but only abnormal results are displayed)  Labs Reviewed  COMPREHENSIVE METABOLIC PANEL WITH GFR - Abnormal; Notable for the following components:      Result Value   Glucose, Bld 157 (*)    Alkaline Phosphatase 142 (*)    All other components within normal limits  URINALYSIS, ROUTINE W REFLEX MICROSCOPIC - Abnormal; Notable for the following components:   Color, Urine YELLOW (*)    APPearance HAZY (*)    pH 9.0 (*)    Hgb urine dipstick MODERATE (*)    Ketones, ur 20 (*)    Protein, ur 30 (*)    All other components within normal limits  LIPASE, BLOOD  CBC  PREGNANCY, URINE     EKG  Not indicated.   RADIOLOGY  Image and radiology report reviewed and interpreted by me. Radiology report consistent with the same.  Not indicated  PROCEDURES:  Critical Care performed:  No  Procedures   MEDICATIONS ORDERED IN ED:  Medications  promethazine  (PHENERGAN ) 25 mg in sodium chloride  0.9 % 50 mL IVPB (0 mg Intravenous Stopped 02/01/24 1847)  sodium chloride  0.9 % bolus 1,000 mL (0 mLs Intravenous Stopped 02/01/24 1847)     IMPRESSION / MDM / ASSESSMENT AND PLAN / ED COURSE   I have reviewed the triage note and vital signs. Vital signs stable   Differential diagnosis includes, but is not limited to, cyclical vomiting, acute viral syndrome, cholecystitis, appendicitis, small bowel obstruction  Patient's presentation is most consistent with acute presentation with potential threat to life or bodily function.  43 year old female presents to the emergency department for treatment and evaluation of vomiting and abdominal pain that is consistent with her chronic cyclic vomiting syndrome.  See HPI for further details.  Labs drawn while awaiting ER room assignment are overall reassuring.  She has a normal CBC, random glucose of 157, alkaline phosphatase of 142 with normal bilirubin and liver function.  Lipase is normal at 17.  Urinalysis shows 20 ketones and 30 protein, pregnancy test is negative.  Plan will be to give IV fluids and IV Phenergan .  Patient states that she has taken Zofran  at home without any relief.  Clinical Course as of 02/01/24 1924  Mon Feb 01, 2024  8171 IV fluids  complete.  Patient states that she is feeling some better and would like to be discharged home.  She has Phenergan  suppositories that she can use if the nausea and vomiting gets worse again.  Outpatient follow-up with primary care and GI discussed.  ER return precautions given. [CT]    Clinical Course User Index [CT] Orvel Cutsforth B, FNP     FINAL CLINICAL IMPRESSION(S) / ED DIAGNOSES   Final diagnoses:  Cyclic vomiting syndrome     Rx / DC Orders   ED Discharge Orders     None        Note:  This document was prepared using Dragon voice recognition software and may  include unintentional dictation errors.   Herlinda Kirk NOVAK, FNP 02/01/24 1924    Dorothyann Drivers, MD 02/01/24 2103

## 2024-02-01 NOTE — Discharge Instructions (Signed)
 Please see primary care this week.  Call your GI doctor to see if your appointment can be moved up.  If your symptoms change or worsen and you are unable to see your primary care provider or the GI specialist, return to the emergency department.

## 2024-02-01 NOTE — ED Triage Notes (Signed)
 Pt presents to the ED via POV from home with what she states is a flare of her CVS (cyclic vomiting syndrome). Pt states that it started this morning. Reports N/V/D and 6/10 generalized abdominal pain

## 2024-02-01 NOTE — ED Notes (Signed)
 Pt stated her normal medications to maintain her chronic N/V has not been working. Vomiting started at 5am. Lower ABD is hurting, pt thought her muscles could be sore from vomiting. States Phenegran shots usually help
